# Patient Record
Sex: Female | Born: 1940 | Race: White | Hispanic: No | Marital: Single | Smoking: Never smoker
Health system: Southern US, Community
[De-identification: ages and names within clinical notes are randomized; demographics above are authoritative.]

## PROBLEM LIST (undated history)

## (undated) DIAGNOSIS — F39 Unspecified mood [affective] disorder: Secondary | ICD-10-CM

## (undated) DIAGNOSIS — E7800 Pure hypercholesterolemia, unspecified: Secondary | ICD-10-CM

## (undated) DIAGNOSIS — K635 Polyp of colon: Secondary | ICD-10-CM

## (undated) DIAGNOSIS — G20A1 Parkinson's disease without dyskinesia, without mention of fluctuations: Secondary | ICD-10-CM

## (undated) HISTORY — DX: Polyp of colon: K63.5

## (undated) HISTORY — PX: COLECTOMY: SHX59

---

## 1999-05-06 ENCOUNTER — Other Ambulatory Visit: Admission: RE | Admit: 1999-05-06 | Discharge: 1999-05-06 | Payer: Self-pay | Admitting: *Deleted

## 1999-07-25 ENCOUNTER — Ambulatory Visit (HOSPITAL_COMMUNITY): Admission: RE | Admit: 1999-07-25 | Discharge: 1999-07-25 | Payer: Self-pay | Admitting: Gastroenterology

## 2001-08-05 ENCOUNTER — Encounter: Admission: RE | Admit: 2001-08-05 | Discharge: 2001-08-05 | Payer: Self-pay | Admitting: Otolaryngology

## 2001-08-05 ENCOUNTER — Encounter: Payer: Self-pay | Admitting: Otolaryngology

## 2006-03-02 ENCOUNTER — Encounter: Admission: RE | Admit: 2006-03-02 | Discharge: 2006-03-02 | Payer: Self-pay | Admitting: Family Medicine

## 2006-05-03 ENCOUNTER — Other Ambulatory Visit: Admission: RE | Admit: 2006-05-03 | Discharge: 2006-05-03 | Payer: Self-pay | Admitting: Family Medicine

## 2007-12-19 ENCOUNTER — Encounter: Admission: RE | Admit: 2007-12-19 | Discharge: 2007-12-19 | Payer: Self-pay | Admitting: Family Medicine

## 2014-06-22 ENCOUNTER — Encounter: Payer: Self-pay | Admitting: Internal Medicine

## 2015-08-27 DIAGNOSIS — F310 Bipolar disorder, current episode hypomanic: Secondary | ICD-10-CM | POA: Diagnosis not present

## 2015-09-26 DIAGNOSIS — R7309 Other abnormal glucose: Secondary | ICD-10-CM | POA: Diagnosis not present

## 2015-09-26 DIAGNOSIS — R7303 Prediabetes: Secondary | ICD-10-CM | POA: Diagnosis not present

## 2015-09-26 DIAGNOSIS — Z1211 Encounter for screening for malignant neoplasm of colon: Secondary | ICD-10-CM | POA: Diagnosis not present

## 2015-09-26 DIAGNOSIS — E559 Vitamin D deficiency, unspecified: Secondary | ICD-10-CM | POA: Diagnosis not present

## 2015-09-26 DIAGNOSIS — Z0000 Encounter for general adult medical examination without abnormal findings: Secondary | ICD-10-CM | POA: Diagnosis not present

## 2015-09-26 DIAGNOSIS — Z1389 Encounter for screening for other disorder: Secondary | ICD-10-CM | POA: Diagnosis not present

## 2015-09-26 DIAGNOSIS — F319 Bipolar disorder, unspecified: Secondary | ICD-10-CM | POA: Diagnosis not present

## 2015-09-26 DIAGNOSIS — E7800 Pure hypercholesterolemia, unspecified: Secondary | ICD-10-CM | POA: Diagnosis not present

## 2015-09-26 DIAGNOSIS — E782 Mixed hyperlipidemia: Secondary | ICD-10-CM | POA: Diagnosis not present

## 2015-09-26 DIAGNOSIS — N183 Chronic kidney disease, stage 3 (moderate): Secondary | ICD-10-CM | POA: Diagnosis not present

## 2015-09-30 ENCOUNTER — Other Ambulatory Visit: Payer: Self-pay

## 2015-09-30 DIAGNOSIS — Z1231 Encounter for screening mammogram for malignant neoplasm of breast: Secondary | ICD-10-CM

## 2015-10-14 DIAGNOSIS — M8589 Other specified disorders of bone density and structure, multiple sites: Secondary | ICD-10-CM | POA: Diagnosis not present

## 2015-10-14 DIAGNOSIS — E2839 Other primary ovarian failure: Secondary | ICD-10-CM | POA: Diagnosis not present

## 2015-11-06 ENCOUNTER — Ambulatory Visit
Admission: RE | Admit: 2015-11-06 | Discharge: 2015-11-06 | Disposition: A | Discharge status: Home / Self Care | Payer: Medicare HMO | Source: Ambulatory Visit

## 2015-11-06 DIAGNOSIS — Z1231 Encounter for screening mammogram for malignant neoplasm of breast: Secondary | ICD-10-CM | POA: Diagnosis not present

## 2015-11-12 DIAGNOSIS — Z1211 Encounter for screening for malignant neoplasm of colon: Secondary | ICD-10-CM | POA: Diagnosis not present

## 2015-11-12 DIAGNOSIS — D128 Benign neoplasm of rectum: Secondary | ICD-10-CM | POA: Diagnosis not present

## 2015-11-12 DIAGNOSIS — D126 Benign neoplasm of colon, unspecified: Secondary | ICD-10-CM | POA: Diagnosis not present

## 2015-11-27 ENCOUNTER — Other Ambulatory Visit: Payer: Self-pay | Admitting: Gastroenterology

## 2015-11-27 NOTE — Addendum Note (Signed)
Addended by: Arta Silence on: 11/27/2015 01:44 PM   Modules accepted: Orders

## 2015-11-29 ENCOUNTER — Ambulatory Visit (HOSPITAL_COMMUNITY)
Admission: RE | Admit: 2015-11-29 | Discharge: 2015-11-29 | Disposition: A | Discharge status: Home / Self Care | Payer: Medicare PPO | Source: Ambulatory Visit | Attending: Gastroenterology | Admitting: Gastroenterology

## 2015-11-29 ENCOUNTER — Encounter (HOSPITAL_COMMUNITY)
Admission: RE | Disposition: A | Discharge status: Home / Self Care | Payer: Self-pay | Source: Ambulatory Visit | Attending: Gastroenterology

## 2015-11-29 ENCOUNTER — Encounter (HOSPITAL_COMMUNITY): Payer: Self-pay | Admitting: *Deleted

## 2015-11-29 DIAGNOSIS — D125 Benign neoplasm of sigmoid colon: Secondary | ICD-10-CM | POA: Insufficient documentation

## 2015-11-29 HISTORY — PX: EUS: SHX5427

## 2015-11-29 SURGERY — ULTRASOUND, LOWER GI TRACT, ENDOSCOPIC
Anesthesia: Moderate Sedation

## 2015-11-29 MED ORDER — MIDAZOLAM HCL 10 MG/2ML IJ SOLN
INTRAMUSCULAR | Status: DC | PRN
  Administered 2015-11-29: 2 mg via INTRAVENOUS
  Administered 2015-11-29: 1 mg via INTRAVENOUS

## 2015-11-29 MED ORDER — FENTANYL CITRATE (PF) 100 MCG/2ML IJ SOLN
INTRAMUSCULAR | Status: AC
  Filled 2015-11-29: qty 2

## 2015-11-29 MED ORDER — FENTANYL CITRATE (PF) 100 MCG/2ML IJ SOLN
INTRAMUSCULAR | Status: DC | PRN
  Administered 2015-11-29: 25 ug via INTRAVENOUS

## 2015-11-29 MED ORDER — SODIUM CHLORIDE 0.9 % IV SOLN
INTRAVENOUS | Status: DC
  Administered 2015-11-29: 500 mL via INTRAVENOUS

## 2015-11-29 MED ORDER — MIDAZOLAM HCL 5 MG/ML IJ SOLN
INTRAMUSCULAR | Status: AC
  Filled 2015-11-29: qty 1

## 2015-11-29 NOTE — H&P (Signed)
Patient interval history reviewed.  Patient examined again.  There has been no change from documented H/P dated 10/30/15 (scanned into chart from our office) except as documented above.  Assessment:  1.  Sigmoid lesion, recent biopsies showed tubular adenoma only, region tattooed.  Plan:  1.  Endorectal ultrasound. 2.  Possible repeat mucosal biopsies. 3.  Risks (bleeding, infection, bowel perforation that could require surgery, sedation-related changes in cardiopulmonary systems), benefits (identification and possible treatment of source of symptoms, exclusion of certain causes of symptoms), and alternatives (watchful waiting, radiographic imaging studies, empiric medical treatment) of endorectal ultrasound were explained to patient/family in detail and patient wishes to proceed.

## 2015-11-29 NOTE — Discharge Instructions (Signed)
Flexible Sigmoidoscopy, Care After  Refer to this sheet in the next few weeks. These instructions provide you with information on caring for yourself after your procedure. Your health care provider may also give you more specific instructions. Your treatment has been planned according to current medical practices, but problems sometimes occur. Call your health care provider if you have any problems or questions after your procedure.  WHAT TO EXPECT AFTER THE PROCEDURE  After your procedure, it is typical to have the following:   · Abdominal cramps.  · Bloating.  · A small amount of rectal bleeding if you had a biopsy.  HOME CARE INSTRUCTIONS  · Only take over-the-counter or prescription medicines for pain, fever, or discomfort as directed by your health care provider.  · Resume your normal diet and activities as directed by your health care provider.  SEEK MEDICAL CARE IF:  · You have abdominal pain or cramping that lasts longer than 1 hour after the procedure.  · You continue to have small amounts of rectal bleeding after 24 hours.  · You have nausea or vomiting.  · You feel weak or dizzy.  SEEK IMMEDIATE MEDICAL CARE IF:   · You have a fever.  · You pass large blood clots or see a large amount of blood in the toilet after having a bowel movement. This may also occur 10-14 days after the procedure. It is more likely if you had a biopsy.  · You develop abdominal pain that is not relieved with medicine or your abdominal pain gets worse.  · You have nausea or vomiting for more than 24 hours after the procedure.     This information is not intended to replace advice given to you by your health care provider. Make sure you discuss any questions you have with your health care provider.     Document Released: 11/21/2013 Document Reviewed: 11/21/2013  Elsevier Interactive Patient Education ©2016 Elsevier Inc.

## 2015-11-29 NOTE — Op Note (Signed)
[facility] [place], [id number]   OPERATIVE PROCEDURE REPORT  PATIENT: Alexandria Wallace, Alexandria Wallace  MR#: [id number] BIRTHDATE: [birth year]  GENDER: female ENDOSCOPIST: Arta Silence, MD REFERRED BY:  Teena Irani, M.D. PROCEDURE DATE:  11/29/2015 PROCEDURE:   Endorectal ultrasound; flexible sigmoidoscopy with biopsies ASA CLASS:   Class II INDICATIONS:1.  sigmoid lesion (tubular adenoma on outside biopsies). MEDICATIONS: Fentanyl 25 mcg IV and Versed 3 mg IV  DESCRIPTION OF PROCEDURE:   After the risks benefits and alternatives of the procedure were thoroughly explained, informed consent was obtained.  Throughout the procedure, the patients blood pressure, pulse and oxygen saturations were monitored continuously. Under direct visualization, the radial forward-viewing echoendoscope  and standard endoscope were sequentially introduced through the anus  and advanced to the sigmoid colon .  Water was used as necessary to provide an acoustic interface.  Imaging was obtained at 7.5 and 12Mhz. Upon completion of the imaging, water was removed and the patient was sent to the recovery room in satisfactory condition. Estimated blood loss is zero unless otherwise noted in this procedure report.    FINDINGS:   Normal digital rectal exam; no mass palpated.  In mid-sigmoid colon, tattoo ink seen, which then led to frond-like soft, pliable, lesion.  Lesion was neither fixed nor ulcerated.  On endorectal ultrasound, lesion was 50% circumferential; while there can be challenges obtaining en face non-tangential views in the sigmoid colon, the lesion appeared to be mucosally based.  No neighboring adenopathy noted.  Repeat mucosal biopsies were taken with cold forceps.  STAGING: Lesion not typical of malignancy.  Suspect large adenomatous polyp.  ENDOSCOPIC IMPRESSION: Suspect large tubular adenoma, repeat biopsies obtained, not readily endoscopically  resectable.  RECOMMENDATIONS: 1.  Watch for potential complications of procedure. 2.  Await repeat biopsy results. 3.  Surgical consultation for consideration of sigmoid colectomy.   _______________________________ Lorrin MaisArta Silence, MD 11/29/2015 9:47 AM   CC:

## 2015-12-03 ENCOUNTER — Encounter (HOSPITAL_COMMUNITY): Payer: Self-pay | Admitting: Gastroenterology

## 2015-12-23 ENCOUNTER — Other Ambulatory Visit: Payer: Self-pay | Admitting: Surgery

## 2015-12-23 NOTE — H&P (Signed)
Alexandria Wallace 12/23/2015 3:04 PM Location: [facility] Patient #: [id number] DOB: Oct 26, 1941 Single / Language: Alexandria Wallace / Race: White Female  History of Present Illness Adin Hector MD; 12/23/2015 3:49 PM) Patient words: New-discuss colon.  The patient is a 75 year old female who presents with a colorectal polyp. Note for "Colorectal polyp": Patient sent for surgical consultation by Dr. Teena Irani & Will Outlaw with [facility] gastroenterology. Concern for polyp at rectosigmoid junction.  Pleasant and anxious woman. She comes today by herself. For screening colonoscopy. Last one 2000 which showed diverticulosis according to the patient. Was found to have bulky polyp at rectosigmoid junction. Felt to be in proximal rectum. Follow-up endorectal ultrasound were consistent with midsigmoid colon. Probably at least 4 cm in size, perhaps larger. Involving 50% of circumference. Not felt to be unresectable by endoscopy. Surgical consultation requested.  Patient normally has a bowel movement every day. She works at [employer] doing some aerobics and other light moderate lifting. She had diagnostic laparoscopy 40 years ago. No other surgeries. Can walk an hour without difficulty. She does not smoke.  No personal nor family history of GI/colon cancer (although she wonders if her grandfather had something wrong with his intestine), inflammatory bowel disease, irritable bowel syndrome, allergy such as Celiac Sprue, dietary/dairy problems, colitis, ulcers nor gastritis. No recent sick contacts/gastroenteritis. No travel outside the country. No changes in diet. No dysphagia to solids or liquids. No significant heartburn or reflux. No hematochezia, hematemesis, coffee ground emesis. No evidence of prior gastric/peptic ulceration.   Other Problems Malachi Bonds, CMA; 12/23/2015 3:04 PM) Hypercholesterolemia  Past Surgical History Malachi Bonds, CMA; 12/23/2015 3:04 PM) No pertinent  past surgical history  Diagnostic Studies History Malachi Bonds, CMA; 12/23/2015 3:04 PM) Colonoscopy within last year Mammogram within last year Pap Smear >5 years ago  Medication History Malachi Bonds, CMA; 12/23/2015 3:08 PM) Zetia (10MG  Tablet, Oral) Active. OLANZapine (15MG  Tablet, Oral) Active. FLUoxetine HCl (20MG  Capsule, Oral) Active. Aspirin (81MG  Tablet, Oral) Active. Vitamin D (2000UNIT Capsule, Oral) Active. FLUoxetine HCl (PMDD) (20MG  Capsule, Oral) Active. Fish Oil (1000MG  Capsule DR, Oral) Active. Tumersaid (Oral) Active. Cyanocobalamin (1000MCG Tablet, Oral) Active. Multivitamin Adult (Oral) Active. Medications Reconciled  Social History Malachi Bonds, CMA; 12/23/2015 3:04 PM) Alcohol use Occasional alcohol use. Caffeine use Coffee, Tea. No drug use Tobacco use Never smoker.  Family History Malachi Bonds, [place]; 12/23/2015 3:04 PM) Anesthetic complications Mother. Cancer Father. Kidney Disease Sister.  Pregnancy / Birth History Malachi Bonds, CMA; 12/23/2015 3:04 PM) Age at menarche 49 years. Age of menopause 53-55 Contraceptive History Oral contraceptives. Gravida 2 Irregular periods Maternal age 58-35 Para 2     Review of Systems Malachi Bonds CMA; 12/23/2015 3:04 PM) General Not Present- Appetite Loss, Chills, Fatigue, Fever, Night Sweats, Weight Gain and Weight Loss. Skin Not Present- Change in Wart/Mole, Dryness, Hives, Jaundice, New Lesions, Non-Healing Wounds, Rash and Ulcer. HEENT Present- Ringing in the Ears. Not Present- Earache, Hearing Loss, Hoarseness, Nose Bleed, Oral Ulcers, Seasonal Allergies, Sinus Pain, Sore Throat, Visual Disturbances, Wears glasses/contact lenses and Yellow Eyes. Respiratory Not Present- Bloody sputum, Chronic Cough, Difficulty Breathing, Snoring and Wheezing. Breast Not Present- Breast Mass, Breast Pain, Nipple Discharge and Skin Changes. Cardiovascular Not Present- Chest Pain,  Difficulty Breathing Lying Down, Leg Cramps, Palpitations, Rapid Heart Rate, Shortness of Breath and Swelling of Extremities. Gastrointestinal Not Present- Abdominal Pain, Bloating, Bloody Stool, Change in Bowel Habits, Chronic diarrhea, Constipation, Difficulty Swallowing, Excessive gas, Gets full quickly at meals, Hemorrhoids, Indigestion, Nausea,  Rectal Pain and Vomiting. Female Genitourinary Not Present- Frequency, Nocturia, Painful Urination, Pelvic Pain and Urgency. Musculoskeletal Not Present- Back Pain, Joint Pain, Joint Stiffness, Muscle Pain, Muscle Weakness and Swelling of Extremities. Neurological Not Present- Decreased Memory, Fainting, Headaches, Numbness, Seizures, Tingling, Tremor, Trouble walking and Weakness. Psychiatric Present- Depression. Not Present- Anxiety, Bipolar, Change in Sleep Pattern, Fearful and Frequent crying. Endocrine Not Present- Cold Intolerance, Excessive Hunger, Hair Changes, Heat Intolerance, Hot flashes and New Diabetes. Hematology Not Present- Easy Bruising, Excessive bleeding, Gland problems, HIV and Persistent Infections.  Vitals (Chemira Jones CMA; 12/23/2015 3:05 PM) 12/23/2015 3:05 PM Weight: 155.8 lb Height: 64in Body Surface Area: 1.76 m Body Mass Index: 26.74 kg/m  Temp.: 98.59F(Oral)  BP: 104/72 (Sitting, Left Arm, Standard)      Physical Exam Adin Hector MD; 12/23/2015 3:40 PM)  General Mental Status-Alert. General Appearance-Not in acute distress, Not Sickly. Orientation-Oriented X3. Hydration-Well hydrated. Voice-Normal.  Integumentary Global Assessment Upon inspection and palpation of skin surfaces of the - Axillae: non-tender, no inflammation or ulceration, no drainage. and Distribution of scalp and body hair is normal. General Characteristics Temperature - normal warmth is noted.  Head and Neck Head-normocephalic, atraumatic with no lesions or palpable masses. Face Global Assessment - atraumatic,  no absence of expression. Neck Global Assessment - no abnormal movements, no bruit auscultated on the right, no bruit auscultated on the left, no decreased range of motion, non-tender. Trachea-midline. Thyroid Gland Characteristics - non-tender.  Eye Eyeball - Left-Extraocular movements intact, No Nystagmus. Eyeball - Right-Extraocular movements intact, No Nystagmus. Cornea - Left-No Hazy. Cornea - Right-No Hazy. Sclera/Conjunctiva - Left-No scleral icterus, No Discharge. Sclera/Conjunctiva - Right-No scleral icterus, No Discharge. Pupil - Left-Direct reaction to light normal. Pupil - Right-Direct reaction to light normal.  ENMT Ears Pinna - Left - no drainage observed, no generalized tenderness observed. Right - no drainage observed, no generalized tenderness observed. Nose and Sinuses External Inspection of the Nose - no destructive lesion observed. Inspection of the nares - Left - quiet respiration. Right - quiet respiration. Mouth and Throat Lips - Upper Lip - no fissures observed, no pallor noted. Lower Lip - no fissures observed, no pallor noted. Nasopharynx - no discharge present. Oral Cavity/Oropharynx - Tongue - no dryness observed. Oral Mucosa - no cyanosis observed. Hypopharynx - no evidence of airway distress observed.  Chest and Lung Exam Inspection Movements - Normal and Symmetrical. Accessory muscles - No use of accessory muscles in breathing. Palpation Palpation of the chest reveals - Non-tender. Auscultation Breath sounds - Normal and Clear.  Cardiovascular Auscultation Rhythm - Regular. Murmurs & Other Heart Sounds - Auscultation of the heart reveals - No Murmurs and No Systolic Clicks.  Abdomen Inspection Inspection of the abdomen reveals - No Visible peristalsis and No Abnormal pulsations. Umbilicus - No Bleeding, No Urine drainage. Palpation/Percussion Palpation and Percussion of the abdomen reveal - Soft, Non Tender, No Rebound  tenderness, No Rigidity (guarding) and No Cutaneous hyperesthesia. Note: Soft. No diastases. No umbilical hernia. No discomfort.  Female Genitourinary Sexual Maturity Tanner 5 - Adult hair pattern. Note: No vaginal bleeding nor discharge  Rectal Note: Mildly redundant anoderm but no external tags/hemorrhoids. Normal sphincter tone. Soft stool in rectal vault. No mass to 10 cm. No definite fullness or falling down. No stricture. No fissure. No fistula.  Peripheral Vascular Upper Extremity Inspection - Left - No Cyanotic nailbeds, Not Ischemic. Right - No Cyanotic nailbeds, Not Ischemic.  Neurologic Neurologic evaluation reveals -normal attention span and ability to  concentrate, able to name objects and repeat phrases. Appropriate fund of knowledge , normal sensation and normal coordination. Mental Status Affect - not angry, not paranoid. Cranial Nerves-Normal Bilaterally. Gait-Normal.  Neuropsychiatric Mental status exam performed with findings of-able to articulate well with normal speech/language, rate, volume and coherence, thought content normal with ability to perform basic computations and apply abstract reasoning and no evidence of hallucinations, delusions, obsessions or homicidal/suicidal ideation. Note: Mildly anxious but consolable  Musculoskeletal Global Assessment Spine, Ribs and Pelvis - no instability, subluxation or laxity. Right Upper Extremity - no instability, subluxation or laxity.  Lymphatic Head & Neck  General Head & Neck Lymphatics: Bilateral - Description - No Localized lymphadenopathy. Axillary  General Axillary Region: Bilateral - Description - No Localized lymphadenopathy. Femoral & Inguinal  Generalized Femoral & Inguinal Lymphatics: Left - Description - No Localized lymphadenopathy. Right - Description - No Localized lymphadenopathy.    Assessment & Plan Adin Hector MD; 12/23/2015 3:43 PM)  ADENOMATOUS RECTAL POLYP  (D12.8) Impression: Large bulky adenomatous polyp at mid sigmoid to rectosigmoid colon. At least 4 cm. Probably 50% in circumference.  I think this requires removal. I think it is too proximal to try and do a TEM partial proctectomy. Therefore I recommended segmental (sigmoid) colonic resection. She is an appropriate robotic candidate. I discussed with her at length. Questions answered. She wishes to proceed as soon as possible.  ENCOUNTER FOR PREOPERATIVE EXAMINATION FOR GENERAL SURGICAL PROCEDURE (Z01.818)  Current Plans You are being scheduled for surgery - Our schedulers will call you.  You should hear from our office's scheduling department within 5 working days about the location, date, and time of surgery. We try to make accommodations for patient's preferences in scheduling surgery, but sometimes the OR schedule or the surgeon's schedule prevents [place] from making those accommodations.  If you have not heard from our office [phone]) in 5 working days, call the office and ask for your surgeon's nurse.  If you have other questions about your diagnosis, plan, or surgery, call the office and ask for your surgeon's nurse.  Written instructions provided Pt Education - [facility] Colon Bowel Prep 2015 Miralax/Antibiotics Started Neomycin Sulfate 500MG , 2 (two) Tablet SEE NOTE, #6, 12/23/2015, No Refill. Local Order: TAKE TWO TABLETS AT 2 PM, 3 PM, AND 10 PM THE DAY PRIOR TO SURGERY Started Flagyl 500MG , 2 (two) Tablet SEE NOTE, #6, 12/23/2015, No Refill. Local Order: Take at 2pm, 3pm, and 10pm the day prior to your colon operation Pt Education - [facility] Colectomy post-op instructions: discussed with patient and provided information. Pt Education - Pamphlet Given - Laparoscopic Colorectal Surgery: discussed with patient and provided information. Pt Education - [facility] Good Bowel Health (Citlalic Norlander) Pt Education - [facility] Pain Control (Tangala Wiegert)  Adin Hector, M.D., F.A.C.S. Gastrointestinal and Minimally  Invasive Surgery [facility] [address], [place], [place] [address] [phone] Main / Paging

## 2016-01-31 ENCOUNTER — Encounter (HOSPITAL_COMMUNITY): Payer: Self-pay | Admitting: Emergency Medicine

## 2016-01-31 ENCOUNTER — Emergency Department (INDEPENDENT_AMBULATORY_CARE_PROVIDER_SITE_OTHER): Payer: Medicare Other

## 2016-01-31 ENCOUNTER — Emergency Department (HOSPITAL_COMMUNITY)
Admission: EM | Admit: 2016-01-31 | Discharge: 2016-01-31 | Disposition: A | Discharge status: Home / Self Care | Payer: Medicare Other | Source: Home / Self Care | Attending: Family Medicine | Admitting: Family Medicine

## 2016-01-31 DIAGNOSIS — W19XXXA Unspecified fall, initial encounter: Secondary | ICD-10-CM | POA: Diagnosis not present

## 2016-01-31 DIAGNOSIS — S52122A Displaced fracture of head of left radius, initial encounter for closed fracture: Secondary | ICD-10-CM

## 2016-01-31 DIAGNOSIS — M79602 Pain in left arm: Secondary | ICD-10-CM

## 2016-01-31 NOTE — Discharge Instructions (Signed)
It is a pleasure to see you today.   The x-rays show you have a radial head fracture.    I discussed with the orthopedist on-call.  You are being given a sling to wear during the day.  You are asked to follow up with Dr. Berenice Primas as soon as tomorrow in his office, or sometime during the coming 1 week.

## 2016-01-31 NOTE — ED Notes (Signed)
Pt reports she fell and tripped over tree roots x3 weeks ago on a trail... No deformity visible... States pain increases when she moves it and lifts objects... Denies head inj/LOC... A&O x4... No acute distress.

## 2016-01-31 NOTE — ED Provider Notes (Addendum)
CSN: [id number]     Arrival date & time 01/31/16  1426 History   First MD Initiated Contact with Patient 01/31/16 1709     Chief Complaint  Patient presents with  . Arm Pain   (Consider location/radiation/quality/duration/timing/severity/associated sxs/prior Treatment) Patient is a 75 y.o. female presenting with arm pain. The history is provided by the patient. No language interpreter was used.  Arm Pain  Patient complains of pain along left forearm and wrist, following trip and fall over a tree root about 3 weeks ago. Landed on her L side, initially had pain from L shoulder to wrist. The L shoulder and elbow pain resolved, but she developed extensive bruising along the L elbow and forearm which has since subsided.   She is concerned because she has continued to have pain along the long bones of the forearm as well as wrist pain; worse with lifting objects, turning steering wheel of car; supinating/pronating L wrist/arm.  She has alternated heat and cold packs; no immobilization.   History reviewed. No pertinent past medical history. Past Surgical History  Procedure Laterality Date  . Eus N/A 11/29/2015    Procedure: LOWER ENDOSCOPIC ULTRASOUND (EUS);  Surgeon: Arta Silence, MD;  Location: [facility] ENDOSCOPY;  Service: Endoscopy;  Laterality: N/A;   No family history on file. Social History  Substance Use Topics  . Smoking status: Never Smoker   . Smokeless tobacco: None  . Alcohol Use: 1.2 oz/week    2 Glasses of wine per week   OB History    No data available     Review of Systems  Constitutional: Negative for fever, chills and fatigue.  Musculoskeletal: Positive for myalgias. Negative for joint swelling.  Neurological: Negative for dizziness, syncope and light-headedness.  All other systems reviewed and are negative.   Allergies  Review of patient's allergies indicates no known allergies.  Home Medications   Prior to Admission medications   Medication Sig Start Date End  Date Taking? Authorizing Provider  aspirin EC 81 MG tablet Take 81 mg by mouth at bedtime.   Yes Historical Provider, MD  FLUoxetine (PROZAC) 20 MG capsule Take 20 mg by mouth daily.  10/22/15  Yes Historical Provider, MD  OLANZapine (ZYPREXA) 15 MG tablet Take 7.5 mg by mouth at bedtime.  10/10/15  Yes Historical Provider, MD  ZETIA 10 MG tablet Take 10 mg by mouth daily.  10/19/15  Yes Historical Provider, MD  Cholecalciferol (VITAMIN D3) 2000 UNITS TABS Take 2,000 Units by mouth daily.    Historical Provider, MD  Omega-3 Fatty Acids (FISH OIL) 1000 MG CAPS Take 1-4 capsules by mouth 2 (two) times daily. 1 capsule in the morning and 3 at night    Historical Provider, MD  Pyridoxine HCl (B-6 PO) Take 1 tablet by mouth 2 (two) times daily.    Historical Provider, MD  Turmeric 450 MG CAPS Take 1 capsule by mouth daily.    Historical Provider, MD  vitamin B-12 (CYANOCOBALAMIN) 1000 MCG tablet Take 1,000 mcg by mouth daily.    Historical Provider, MD   Meds Ordered and Administered this Visit  Medications - No data to display  BP 111/72 mmHg  Pulse 73  Temp(Src) 98.1 F (36.7 C) (Oral)  Resp 12  SpO2 98% No data found.   Physical Exam  Constitutional: She appears well-developed and well-nourished. No distress.  Musculoskeletal:  Handgrip full and symmetric bilaterally.  Palpable radial pulses bilaterally.  Sensation in distal fingers intact and symmetric bilaterally.   Tenderness to  palpate along the forearm. No ecchymosis.  Full active flexion/extension of L elbow.  Wrist with full active flexion/extension and ulnar/radial deviation. No tenderness along anatomic snuff box in L wrist.   Neurological:  Sensation intact in distal fingers of both hands. Brisk cap refill in fingertips bilaterally (<2 seconds).   Skin: She is not diaphoretic.    ED Course  Procedures (including critical care time)  Labs Review Labs Reviewed - No data to display  Imaging Review No results  found.   Visual Acuity Review  Right Eye Distance:   Left Eye Distance:   Bilateral Distance:    Right Eye Near:   Left Eye Near:    Bilateral Near:         MDM  No diagnosis found. Patient who is s/p fall with continued pain along L forearm.  X-ray evaluation of L elbow, forearm and wrist: Nondisplaced radial head fracture.  Films reviewed by me independently.   Discussed with orthopedist on call, Dr. Berenice Primas of [facility], who recommends sling and follow up with him in his office within the next week.    Dalbert Mayotte, MD    Willeen Niece, MD 01/31/16 [place], MD 01/31/16 [phone]

## 2016-02-19 ENCOUNTER — Encounter (HOSPITAL_COMMUNITY): Admission: RE | Payer: Self-pay | Source: Ambulatory Visit

## 2016-02-19 ENCOUNTER — Inpatient Hospital Stay (HOSPITAL_COMMUNITY): Admission: RE | Admit: 2016-02-19 | Payer: Medicare PPO | Source: Ambulatory Visit | Admitting: Surgery

## 2016-02-19 HISTORY — PX: POLYPECTOMY: SHX149

## 2016-02-19 SURGERY — COLECTOMY, PARTIAL, ROBOT-ASSISTED, LAPAROSCOPIC
Anesthesia: General

## 2016-02-25 ENCOUNTER — Encounter (HOSPITAL_BASED_OUTPATIENT_CLINIC_OR_DEPARTMENT_OTHER): Payer: Self-pay | Admitting: *Deleted

## 2016-02-25 ENCOUNTER — Emergency Department (HOSPITAL_BASED_OUTPATIENT_CLINIC_OR_DEPARTMENT_OTHER)
Admission: EM | Admit: 2016-02-25 | Discharge: 2016-02-25 | Disposition: A | Discharge status: Home / Self Care | Payer: Medicare Other | Attending: Emergency Medicine | Admitting: Emergency Medicine

## 2016-02-25 ENCOUNTER — Emergency Department (HOSPITAL_BASED_OUTPATIENT_CLINIC_OR_DEPARTMENT_OTHER): Payer: Medicare Other

## 2016-02-25 DIAGNOSIS — K1121 Acute sialoadenitis: Secondary | ICD-10-CM | POA: Insufficient documentation

## 2016-02-25 DIAGNOSIS — E7800 Pure hypercholesterolemia, unspecified: Secondary | ICD-10-CM | POA: Insufficient documentation

## 2016-02-25 DIAGNOSIS — R220 Localized swelling, mass and lump, head: Secondary | ICD-10-CM | POA: Diagnosis present

## 2016-02-25 DIAGNOSIS — Z7982 Long term (current) use of aspirin: Secondary | ICD-10-CM | POA: Diagnosis not present

## 2016-02-25 DIAGNOSIS — Z8659 Personal history of other mental and behavioral disorders: Secondary | ICD-10-CM | POA: Diagnosis not present

## 2016-02-25 DIAGNOSIS — Z79899 Other long term (current) drug therapy: Secondary | ICD-10-CM | POA: Insufficient documentation

## 2016-02-25 HISTORY — DX: Pure hypercholesterolemia, unspecified: E78.00

## 2016-02-25 HISTORY — DX: Unspecified mood (affective) disorder: F39

## 2016-02-25 LAB — BASIC METABOLIC PANEL
ANION GAP: 10 (ref 5–15)
BUN: 15 mg/dL (ref 6–20)
CALCIUM: 9.5 mg/dL (ref 8.9–10.3)
CO2: 23 mmol/L (ref 22–32)
CREATININE: 0.88 mg/dL (ref 0.44–1.00)
Chloride: 107 mmol/L (ref 101–111)
GFR calc Af Amer: >60 mL/min (ref >60)
GLUCOSE: 114 mg/dL — AB (ref 65–99)
Potassium: 4 mmol/L (ref 3.5–5.1)
Sodium: 140 mmol/L (ref 135–145)

## 2016-02-25 LAB — CBC WITH DIFFERENTIAL/PLATELET
Basophils Absolute: 0 10*3/uL (ref 0.0–0.1)
Basophils Relative: 0 %
EOS ABS: 0.1 10*3/uL (ref 0.0–0.7)
EOS PCT: 1 %
HCT: 28.5 % — ABNORMAL LOW (ref 36.0–46.0)
Hemoglobin: 9.2 g/dL — ABNORMAL LOW (ref 12.0–15.0)
LYMPHS ABS: 1.7 10*3/uL (ref 0.7–4.0)
LYMPHS PCT: 11 %
MCH: 31.8 pg (ref 26.0–34.0)
MCHC: 32.3 g/dL (ref 30.0–36.0)
MCV: 98.6 fL (ref 78.0–100.0)
MONOS PCT: 8 %
Monocytes Absolute: 1.3 10*3/uL — ABNORMAL HIGH (ref 0.1–1.0)
Neutro Abs: 12.6 10*3/uL — ABNORMAL HIGH (ref 1.7–7.7)
Neutrophils Relative %: 80 %
PLATELETS: 350 10*3/uL (ref 150–400)
RBC: 2.89 MIL/uL — ABNORMAL LOW (ref 3.87–5.11)
RDW: 13.4 % (ref 11.5–15.5)
WBC: 15.7 10*3/uL — ABNORMAL HIGH (ref 4.0–10.5)

## 2016-02-25 MED ORDER — CLINDAMYCIN HCL 150 MG PO CAPS
450.0000 mg | ORAL_CAPSULE | Freq: Once | ORAL | Status: AC
  Administered 2016-02-25: 450 mg via ORAL
  Filled 2016-02-25: qty 3

## 2016-02-25 MED ORDER — CLINDAMYCIN HCL 150 MG PO CAPS: 450.0000 mg | ORAL_CAPSULE | Freq: Four times a day (QID) | ORAL | Status: DC

## 2016-02-25 MED ORDER — IOPAMIDOL (ISOVUE-300) INJECTION 61%
100.0000 mL | Freq: Once | INTRAVENOUS | Status: AC | PRN
  Administered 2016-02-25: 100 mL via INTRAVENOUS

## 2016-02-25 MED ORDER — SODIUM CHLORIDE 0.9 % IV BOLUS (SEPSIS)
1000.0000 mL | Freq: Once | INTRAVENOUS | Status: AC
  Administered 2016-02-25: 1000 mL via INTRAVENOUS

## 2016-02-25 NOTE — ED Notes (Signed)
MD at bedside. 

## 2016-02-25 NOTE — ED Notes (Signed)
Left side of her face has been swelling x 2 days. She was seen at [facility] and told to come here. No difficulty speaking or breathing.

## 2016-02-25 NOTE — ED Notes (Signed)
Pt states she had surgery at [facility]. Sunday, noticed left side facial swelling and pain. Fever last p.m. No cough, problem swallowing, or SHOB. Saw PCP today and was sent here with ?elevated WBC and low CBC.

## 2016-02-25 NOTE — ED Provider Notes (Signed)
CSN: [id number]     Arrival date & time 02/25/16  1401 History   First MD Initiated Contact with Patient 02/25/16 1525     Chief Complaint  Patient presents with  . Facial Swelling     (Consider location/radiation/quality/duration/timing/severity/associated sxs/prior Treatment) HPI  75 year old female presents with left facial swelling. Started 2 days ago. Some pain but ibuprofen has essentially reduce the pain down to a 1. Was seen at her PCP where they drew labs that showed an elevated white blood cell count to 16 and a low hemoglobin at 9.4. Patient just had a partial colectomy last week at [facility]. She does not know what her hemoglobin normally is. Patient states she had a fever last night of 101. She has not noticed any dental pain. There is no pain in her ear, only under her ear.  Past Medical History  Diagnosis Date  . High cholesterol   . Mood disorder [facility])    Past Surgical History  Procedure Laterality Date  . Eus N/A 11/29/2015    Procedure: LOWER ENDOSCOPIC ULTRASOUND (EUS);  Surgeon: Arta Silence, MD;  Location: [facility] ENDOSCOPY;  Service: Endoscopy;  Laterality: N/A;  . Colectomy     No family history on file. Social History  Substance Use Topics  . Smoking status: Never Smoker   . Smokeless tobacco: None  . Alcohol Use: 1.2 oz/week    2 Glasses of wine per week   OB History    No data available     Review of Systems  Constitutional: Positive for fever.  HENT: Positive for facial swelling. Negative for dental problem, sore throat, trouble swallowing and voice change.   Respiratory: Negative for cough and shortness of breath.   Gastrointestinal: Negative for vomiting.  Genitourinary: Negative for dysuria.  All other systems reviewed and are negative.     Allergies  Review of patient's allergies indicates no known allergies.  Home Medications   Prior to Admission medications   Medication Sig Start Date End Date Taking? Authorizing Provider  aspirin EC  81 MG tablet Take 81 mg by mouth at bedtime.    Historical Provider, MD  Cholecalciferol (VITAMIN D3) 2000 UNITS TABS Take 2,000 Units by mouth daily.    Historical Provider, MD  FLUoxetine (PROZAC) 20 MG capsule Take 20 mg by mouth daily.  10/22/15   Historical Provider, MD  OLANZapine (ZYPREXA) 15 MG tablet Take 7.5 mg by mouth at bedtime.  10/10/15   Historical Provider, MD  Omega-3 Fatty Acids (FISH OIL) 1000 MG CAPS Take 1-4 capsules by mouth 2 (two) times daily. 1 capsule in the morning and 3 at night    Historical Provider, MD  Pyridoxine HCl (B-6 PO) Take 1 tablet by mouth 2 (two) times daily.    Historical Provider, MD  Turmeric 450 MG CAPS Take 1 capsule by mouth daily.    Historical Provider, MD  vitamin B-12 (CYANOCOBALAMIN) 1000 MCG tablet Take 1,000 mcg by mouth daily.    Historical Provider, MD  ZETIA 10 MG tablet Take 10 mg by mouth daily.  10/19/15   Historical Provider, MD   BP 104/88 mmHg  Pulse 104  Temp(Src) 99.5 F (37.5 C) (Oral)  Resp 18  Ht 5\' 4"  (1.626 m)  Wt 150 lb (68.04 kg)  BMI 25.73 kg/m2  SpO2 99% Physical Exam  Constitutional: She is oriented to person, place, and time. She appears well-developed and well-nourished.  HENT:  Head: Normocephalic and atraumatic.    Right Ear: Tympanic membrane and  external ear normal.  Left Ear: Tympanic membrane and external ear normal.  Nose: Nose normal.  Mouth/Throat: Oropharynx is clear and moist. No oropharyngeal exudate.  Eyes: Right eye exhibits no discharge. Left eye exhibits no discharge.  Neck: Normal range of motion.  No meningismus  Cardiovascular: Normal rate, regular rhythm and normal heart sounds.   Pulmonary/Chest: Effort normal and breath sounds normal.  Abdominal: Soft. There is no tenderness.  Neurological: She is alert and oriented to person, place, and time.  Skin: Skin is warm and dry.  Nursing note and vitals reviewed.   ED Course  Procedures (including critical care time) Labs  Review Labs Reviewed  BASIC METABOLIC PANEL - Abnormal; Notable for the following:    Glucose, Bld 114 (*)    All other components within normal limits  CBC WITH DIFFERENTIAL/PLATELET - Abnormal; Notable for the following:    WBC 15.7 (*)    RBC 2.89 (*)    Hemoglobin 9.2 (*)    HCT 28.5 (*)    Neutro Abs 12.6 (*)    Monocytes Absolute 1.3 (*)    All other components within normal limits    Imaging Review Ct Soft Tissue Neck W Contrast  02/25/2016  CLINICAL DATA:  Swelling and left-sided neck pain for for the past 2 days. Colonic surgery 1 week ago. EXAM: CT NECK WITH CONTRAST TECHNIQUE: Multidetector CT imaging of the neck was performed using the standard protocol following the bolus administration of intravenous contrast. CONTRAST:  125mL ISOVUE-300 IOPAMIDOL (ISOVUE-300) INJECTION 61% COMPARISON:  None. FINDINGS: Pharynx and larynx: Negative. Salivary glands: Parotid glands are partially obscured by extensive dental amalgam. There appears to be enlargement and edema of the LEFT parotid gland. Stranding is seen of the fat extending caudally towards the LEFT submandibular gland, and along the anterior margin of LEFT sternocleidomastoid. The LEFT submandibular gland is not clearly enlarged. No submandibular or parotid calculi are seen. Thyroid: Estimated 9 x 14 mm partially calcified RIGHT thyroid nodule. This does not overall enlarged gland. Lymph nodes: No pathologic adenopathy. LEFT level IIA node up to 6 mm short axis adjacent to the area of inflammation. Vascular: Mild atheromatous change. Limited intracranial: Negative. Visualized orbits: Negative. Mastoids and visualized paranasal sinuses: Negative. Skeleton: No osseous findings. Upper chest: No masses or pneumothorax. IMPRESSION: Suspected unilateral LEFT parotitis. This may be related to the recent surgical procedure. Other infectious or inflammatory conditions are possible. Mild stranding around the LEFT parotid, without visible calculi.  Electronically Signed   By: Staci Righter M.D.   On: 02/25/2016 17:21   I have personally reviewed and evaluated these images and lab results as part of my medical decision-making.   EKG Interpretation None      MDM   Final diagnoses:  Acute parotitis    Patient is well-appearing here. Mildly tachycardic on original presentation but now heart rate is in the 70s. She is not in acute distress. No airway signs/symptoms. CT scan does not show abscess or cellulitis but does show left parotiditis. Given fevers at home, she will be treated with oral antibiotics. Her hemoglobin is low but this seems to be stable compared to lab work 5 days ago at [facility] after her surgery. She does have an elevated white blood cell count but appears quite well and it do not think she needs admission or IV antibiotics. Discussed return cautions and need for close follow-up.    Sherwood Gambler, MD 02/25/16 [phone]

## 2016-10-02 ENCOUNTER — Telehealth: Payer: Self-pay | Admitting: Internal Medicine

## 2016-10-02 NOTE — Telephone Encounter (Signed)
Received records from [place] for appointment on 11/11/16 with Dr Debara Pickett.  Records given to [facility] (medical records) for Dr [facility] schedule on 11/11/16. lp

## 2016-11-11 ENCOUNTER — Ambulatory Visit (INDEPENDENT_AMBULATORY_CARE_PROVIDER_SITE_OTHER): Payer: Medicare Other | Admitting: Internal Medicine

## 2016-11-11 ENCOUNTER — Encounter: Payer: Self-pay | Admitting: Internal Medicine

## 2016-11-11 VITALS — BP 108/72 | HR 84 | Ht 64.0 in | Wt 154.0 lb

## 2016-11-11 DIAGNOSIS — R011 Cardiac murmur, unspecified: Secondary | ICD-10-CM

## 2016-11-11 DIAGNOSIS — E782 Mixed hyperlipidemia: Secondary | ICD-10-CM | POA: Insufficient documentation

## 2016-11-11 NOTE — Progress Notes (Signed)
OFFICE NOTE  Chief Complaint:  Murmur  Primary Care Physician: Gerrit Heck, MD  HPI:  Alexandria Wallace is a 75 y.o. female who I previously saw in 2012 for evaluation of heart murmur. She underwent an echocardiogram at that time which showed an EF of greater than [id number], mild diastolic dysfunction, mild to moderate TR with an RVSP of 40 mmHg, and mild aortic sclerosis without stenosis. She also underwent a stress test which showed no reversible ischemia. She denies any chest pain or worsening shortness of breath. Recently she saw her primary care provider who noted a heart murmur which was not previously appreciated. She was referred for reevaluation. Alexandria Wallace reports doing well. She is quite active physically. Although she takes medications for mood disorder and has some mildly elevated cholesterol, she has no other cardiac issues that were aware of. Cholesterol is treated by her primary care provider and she is on zetia. Her mother died at age 67 of an MI but there is no other coronary disease in the family.  PMHx:  Past Medical History:  Diagnosis Date  . High cholesterol   . Mood disorder [facility])     Past Surgical History:  Procedure Laterality Date  . COLECTOMY    . EUS N/A 11/29/2015   Procedure: LOWER ENDOSCOPIC ULTRASOUND (EUS);  Surgeon: Arta Silence, MD;  Location: [facility] ENDOSCOPY;  Service: Endoscopy;  Laterality: N/A;    FAMHx:  Family History  Problem Relation Age of Onset  . Heart attack Mother   . Cancer Father     SOCHx:   reports that she has never smoked. She has never used smokeless tobacco. She reports that she drinks about 1.2 oz of alcohol per week . She reports that she does not use drugs.  ALLERGIES:  No Known Allergies  ROS: Pertinent items noted in HPI and remainder of comprehensive ROS otherwise negative.  HOME MEDS: Current Outpatient Prescriptions on File Prior to Visit  Medication Sig Dispense Refill  . aspirin EC 81 MG  tablet Take 81 mg by mouth at bedtime.    . Cholecalciferol (VITAMIN D3) 2000 UNITS TABS Take 2,000 Units by mouth daily.    Marland Kitchen FLUoxetine (PROZAC) 20 MG capsule Take 20 mg by mouth daily.     Marland Kitchen OLANZapine (ZYPREXA) 15 MG tablet Take 7.5 mg by mouth at bedtime.     . Omega-3 Fatty Acids (FISH OIL) 1000 MG CAPS Take 1-4 capsules by mouth 2 (two) times daily. 1 capsule in the morning and 3 at night    . Pyridoxine HCl (B-6 PO) Take 1 tablet by mouth 2 (two) times daily.    . Turmeric 450 MG CAPS Take 1 capsule by mouth daily.    . vitamin B-12 (CYANOCOBALAMIN) 1000 MCG tablet Take 1,000 mcg by mouth daily.    Marland Kitchen ZETIA 10 MG tablet Take 10 mg by mouth daily.      No current facility-administered medications on file prior to visit.     LABS/IMAGING: No results found for this or any previous visit (from the past 48 hour(s)). No results found.  WEIGHTS: Wt Readings from Last 3 Encounters:  11/11/16 154 lb (69.9 kg)  02/25/16 150 lb (68 kg)  11/29/15 157 lb (71.2 kg)    VITALS: BP 108/72   Pulse 84   Ht 5\' 4"  (1.626 m)   Wt 154 lb (69.9 kg)   BMI 26.43 kg/m   EXAM: General appearance: alert and no distress Neck: no carotid bruit  and no JVD Lungs: clear to auscultation bilaterally Heart: regular rate and rhythm, S1, S2 normal and systolic murmur: early systolic 3/6, crescendo at 2nd right intercostal space Abdomen: soft, non-tender; bowel sounds normal; no masses,  no organomegaly Extremities: extremities normal, atraumatic, no cyanosis or edema Pulses: 2+ and symmetric Skin: Skin color, texture, turgor normal. No rashes or lesions Neurologic: Grossly normal Psych: Pleasant  EKG: Normal sinus rhythm at 84  ASSESSMENT: 1. Murmur, with history of tricuspid regurgitation and mild aortic sclerosis 2. Dyslipidemia  PLAN: 1.   [name] has a somewhat louder murmur and has known tricuspid regurgitation and mild aortic sclerosis by echo in 2012. Based on exam changes, would  recommend a repeat echo to assess for stability of the valves. She denies shortness of breath with exertion however does not do significant activity. I'll follow-up with her regarding the echo results, otherwise I can see her annually.  Thanks for referring her back for evaluation.  Pixie Casino, MD, [facility] Attending Cardiologist [place] [facility] 11/11/2016, 10:31 AM

## 2016-11-11 NOTE — Patient Instructions (Signed)
Schedule at [facility]  [address] at  [place] has requested that you have an echocardiogram. Echocardiography is a painless test that uses sound waves to create images of your heart. It provides your doctor with information about the size and shape of your heart and how well your heart's chambers and valves are working. This procedure takes approximately one hour. There are no restrictions for this procedure.    No other changes with medications    Your physician wants you to follow-up in 12 months with Dr Debara Pickett. You will receive a reminder letter in the mail two months in advance. If you don't receive a letter, please call our office to schedule the follow-up appointment.  If you need a refill on your cardiac medications before your next appointment, please call your pharmacy.

## 2016-12-07 ENCOUNTER — Ambulatory Visit (HOSPITAL_COMMUNITY): Payer: Medicare Other | Attending: Cardiology

## 2016-12-07 ENCOUNTER — Other Ambulatory Visit: Payer: Self-pay

## 2016-12-07 DIAGNOSIS — R011 Cardiac murmur, unspecified: Secondary | ICD-10-CM | POA: Diagnosis not present

## 2016-12-07 DIAGNOSIS — I358 Other nonrheumatic aortic valve disorders: Secondary | ICD-10-CM | POA: Insufficient documentation

## 2018-01-27 ENCOUNTER — Encounter (HOSPITAL_COMMUNITY)
Admission: RE | Disposition: A | Discharge status: Home / Self Care | Payer: Self-pay | Source: Ambulatory Visit | Attending: Gastroenterology

## 2018-01-27 ENCOUNTER — Other Ambulatory Visit: Payer: Self-pay | Admitting: Gastroenterology

## 2018-01-27 ENCOUNTER — Other Ambulatory Visit: Payer: Self-pay

## 2018-01-27 ENCOUNTER — Encounter (HOSPITAL_COMMUNITY): Payer: Self-pay | Admitting: *Deleted

## 2018-01-27 ENCOUNTER — Ambulatory Visit (HOSPITAL_COMMUNITY)
Admission: RE | Admit: 2018-01-27 | Discharge: 2018-01-27 | Disposition: A | Discharge status: Home / Self Care | Payer: Medicare Other | Source: Ambulatory Visit | Attending: Gastroenterology | Admitting: Gastroenterology

## 2018-01-27 DIAGNOSIS — K921 Melena: Secondary | ICD-10-CM | POA: Diagnosis not present

## 2018-01-27 DIAGNOSIS — Z9049 Acquired absence of other specified parts of digestive tract: Secondary | ICD-10-CM | POA: Insufficient documentation

## 2018-01-27 DIAGNOSIS — Z7982 Long term (current) use of aspirin: Secondary | ICD-10-CM | POA: Diagnosis not present

## 2018-01-27 DIAGNOSIS — F39 Unspecified mood [affective] disorder: Secondary | ICD-10-CM | POA: Diagnosis not present

## 2018-01-27 DIAGNOSIS — K9189 Other postprocedural complications and disorders of digestive system: Secondary | ICD-10-CM | POA: Diagnosis not present

## 2018-01-27 DIAGNOSIS — R7303 Prediabetes: Secondary | ICD-10-CM | POA: Diagnosis not present

## 2018-01-27 DIAGNOSIS — E7800 Pure hypercholesterolemia, unspecified: Secondary | ICD-10-CM | POA: Diagnosis not present

## 2018-01-27 DIAGNOSIS — Z86010 Personal history of colonic polyps: Secondary | ICD-10-CM | POA: Diagnosis not present

## 2018-01-27 DIAGNOSIS — K625 Hemorrhage of anus and rectum: Secondary | ICD-10-CM | POA: Diagnosis present

## 2018-01-27 DIAGNOSIS — Y92234 Operating room of hospital as the place of occurrence of the external cause: Secondary | ICD-10-CM | POA: Insufficient documentation

## 2018-01-27 DIAGNOSIS — N182 Chronic kidney disease, stage 2 (mild): Secondary | ICD-10-CM | POA: Diagnosis not present

## 2018-01-27 DIAGNOSIS — Z79899 Other long term (current) drug therapy: Secondary | ICD-10-CM | POA: Diagnosis not present

## 2018-01-27 DIAGNOSIS — Z980 Intestinal bypass and anastomosis status: Secondary | ICD-10-CM | POA: Diagnosis not present

## 2018-01-27 DIAGNOSIS — Y832 Surgical operation with anastomosis, bypass or graft as the cause of abnormal reaction of the patient, or of later complication, without mention of misadventure at the time of the procedure: Secondary | ICD-10-CM | POA: Insufficient documentation

## 2018-01-27 DIAGNOSIS — E559 Vitamin D deficiency, unspecified: Secondary | ICD-10-CM | POA: Diagnosis not present

## 2018-01-27 HISTORY — PX: FLEXIBLE SIGMOIDOSCOPY: SHX5431

## 2018-01-27 SURGERY — SIGMOIDOSCOPY, FLEXIBLE
Anesthesia: Moderate Sedation

## 2018-01-27 MED ORDER — FENTANYL CITRATE (PF) 100 MCG/2ML IJ SOLN
INTRAMUSCULAR | Status: AC
  Filled 2018-01-27: qty 2

## 2018-01-27 MED ORDER — MIDAZOLAM HCL 5 MG/ML IJ SOLN
INTRAMUSCULAR | Status: AC
  Filled 2018-01-27: qty 2

## 2018-01-27 NOTE — H&P (Signed)
Alexandria Wallace is a 77 y.o. female has presented with rectal bleeding The various methods of treatment have been discussed with the patient and family. After consideration of risks, benefits and other options for treatment, the patient has consented to  Procedure(s): Flexible Sigmoidoscopy with APC  as an intervention .  The patient's history has been reviewed, patient examined, no change in status, stable for surgery.  I have reviewed the patient's chart and labs.  Questions were answered to the patient's satisfaction.   Patient underwent flexible sigmoidoscopy today at the endoscopy center which showed active bleeding from hypervascular area at the anastomotic site in the rectum. Different management options discussed with the patient in the postoperative area.  also discussed with Dr. Oletta Lamas as well as Dr. Watt Climes. We have decided to flexible sigmoidoscopy for APC for rectal bleeding today.  Risks (bleeding, infection, post-APC ulcer formation,  bowel perforation that could require surgery, sedation-related changes in cardiopulmonary systems), benefits (identification and possible treatment of source of symptoms, exclusion of certain causes of symptoms), and alternatives (watchful waiting, radiographic imaging studies, empiric medical treatment)  were explained to patient in detail and patient wishes to proceed.  Otis Brace MD, [place] 01/27/2018, 3:31 PM  Contact #  [phone]

## 2018-01-27 NOTE — Discharge Instructions (Signed)

## 2018-01-27 NOTE — Op Note (Signed)
[facility] Patient Name: Alexandria Wallace Procedure Date : 01/27/2018 MRN: [id number] Attending MD: Otis Brace , MD Date of Birth: 09/01/1941 CSN: [id number] Age: 77 Admit Type: Outpatient Procedure:                Flexible Sigmoidoscopy Indications:              Hematochezia Providers:                Otis Brace, MD, Kingsley Plan, RN, Cletis Athens, Technician Referring MD:              Medicines:                None Complications:            No immediate complications. Estimated Blood Loss:     Estimated blood loss was minimal. Procedure:                Pre-Anesthesia Assessment:                           - Prior to the procedure, a History and Physical                            was performed, and patient medications and                            allergies were reviewed. The patient's tolerance of                            previous anesthesia was also reviewed. The risks                            and benefits of the procedure and the sedation                            options and risks were discussed with the patient.                            All questions were answered, and informed consent                            was obtained. Prior Anticoagulants: The patient has                            taken no previous anticoagulant or antiplatelet                            agents. ASA Grade Assessment: II - A patient with                            mild systemic disease. After reviewing the risks                            and benefits, the  patient was deemed in                            satisfactory condition to undergo the procedure.                           After obtaining informed consent, the scope was                            passed under direct vision. The [id number] ([id number])                            scope was introduced through the anus and advanced                            to the the rectum. The flexible  sigmoidoscopy was                            accomplished without difficulty. The patient                            tolerated the procedure well. The quality of the                            bowel preparation was good. Findings:      The perianal and digital rectal examinations were normal.      There was evidence of a prior end-to-side colo-rectal anastomosis in the       rectum. This was patent and was characterized by erosion, erythema,       friable mucosa and a hemorrhagic appearance. Fulguration to stop the       bleeding by argon plasma at 0.5 liters/minute and 15 watts was       successful. Estimated blood loss was minimal. Impression:               - Patent end-to-side colo-rectal anastomosis,                            characterized by erosion, erythema, friable mucosa                            and a hemorrhagic appearance. Treated with argon                            plasma coagulation (APC).                           - No specimens collected. Recommendation:           - Patient has a contact number available for                            emergencies. The signs and symptoms of potential                            delayed complications were discussed with the  patient. Return to normal activities tomorrow.                            Written discharge instructions were provided to the                            patient.                           - Resume previous diet.                           - Continue present medications.                           - Repeat flexible sigmoidoscopy PRN for retreatment. Procedure Code(s):        --- Professional ---                           [phone], 54, Sigmoidoscopy, flexible; with control of                            bleeding, any method Diagnosis Code(s):        --- Professional ---                           Z98.0, Intestinal bypass and anastomosis status                           K92.1, Melena (includes  Hematochezia) CPT copyright 2016 [employer]. All rights reserved. The codes documented in this report are preliminary and upon coder review may  be revised to meet current compliance requirements. Otis Brace, MD Otis Brace, MD 01/27/2018 4:26:57 PM Number of Addenda: 0

## 2018-08-22 ENCOUNTER — Other Ambulatory Visit: Payer: Self-pay | Admitting: Family Medicine

## 2018-08-22 DIAGNOSIS — Z1231 Encounter for screening mammogram for malignant neoplasm of breast: Secondary | ICD-10-CM

## 2018-08-31 ENCOUNTER — Other Ambulatory Visit: Payer: Self-pay | Admitting: Family Medicine

## 2018-08-31 DIAGNOSIS — N644 Mastodynia: Secondary | ICD-10-CM

## 2018-09-01 ENCOUNTER — Encounter: Payer: Self-pay | Admitting: Internal Medicine

## 2018-09-01 ENCOUNTER — Ambulatory Visit: Payer: Medicare Other | Admitting: Internal Medicine

## 2018-09-01 VITALS — BP 96/62 | HR 63 | Ht 64.0 in | Wt 158.4 lb

## 2018-09-01 DIAGNOSIS — R5383 Other fatigue: Secondary | ICD-10-CM | POA: Diagnosis not present

## 2018-09-01 DIAGNOSIS — Z01812 Encounter for preprocedural laboratory examination: Secondary | ICD-10-CM | POA: Diagnosis not present

## 2018-09-01 DIAGNOSIS — E7849 Other hyperlipidemia: Secondary | ICD-10-CM | POA: Diagnosis not present

## 2018-09-01 DIAGNOSIS — R0609 Other forms of dyspnea: Secondary | ICD-10-CM | POA: Diagnosis not present

## 2018-09-01 MED ORDER — ROSUVASTATIN CALCIUM 40 MG PO TABS: 40.0000 mg | ORAL_TABLET | Freq: Every day | ORAL | 3 refills | Status: DC

## 2018-09-01 MED ORDER — METOPROLOL TARTRATE 50 MG PO TABS: ORAL_TABLET | ORAL | 0 refills | Status: DC

## 2018-09-01 NOTE — Progress Notes (Signed)
OFFICE NOTE  Chief Complaint:  Fatigue, hypotension, dyspnea on exertion  Primary Care Physician: Cari Caraway, MD  HPI:  Alexandria Wallace is a 77 y.o. female who I previously saw in 2012 for evaluation of heart murmur. She underwent an echocardiogram at that time which showed an EF of greater than 01%, mild diastolic dysfunction, mild to moderate TR with an RVSP of 40 mmHg, and mild aortic sclerosis without stenosis. She also underwent a stress test which showed no reversible ischemia. She denies any chest pain or worsening shortness of breath. Recently she saw her primary care provider who noted a heart murmur which was not previously appreciated. She was referred for reevaluation. Alexandria Wallace reports doing well. She is quite active physically. Although she takes medications for mood disorder and has some mildly elevated cholesterol, she has no other cardiac issues that were aware of. Cholesterol is treated by her primary care provider and she is on zetia. Her mother died at age 78 of an MI but there is no other coronary disease in the family.  09/01/2018  Alexandria Wallace returns today for follow-up.  I have only seen her twice in the last 7 years.  Initially in 2012 for evaluation of heart murmur.  Echo showed some TR and mild aortic sclerosis.  I last saw her in 2017 and a repeat echo in 2018 showed mild aortic sclerosis with normal systolic function and no significant valvular abnormalities.  She mentioned that she had a history of elevated cholesterol in the past but was hesitant to take statins.  She denied any history of heart disease in her family and noted that her mother died at age 20 and did have an MRI.  Her father actually died in his 51s from cancer and is unclear whether he had any coronary disease.  I did receive lab work from her PCP at [facility], Dr. Addison Lank that indicated a severe dyslipidemia.  In fact in June 2019 her total cholesterol was 321, triglycerides 144, HDL 56 and  LDL-C of 236.  Non-HDL cholesterol 265.  This is in the setting of ezetimibe.  When I press her more on this she says that actually she was given the diagnosis of a genetic dyslipidemia when she spent some time in [place].  Her daughter apparently is an Systems developer and also practices herbal medications and has convinced her that statin therapy is potentially harmful.  Alexandria Wallace was on Crestor for short period of time and apparently tolerated it with an appropriate decrease in her cholesterol.  These findings are highly suggestive of FH and certainly raise my concern that her symptoms may be due to significant coronary artery disease.  She had last had stress testing back in 2012 which was negative for ischemia.  Currently she gets short of breath when doing moderate to significant exertion, such as walking upstairs or doing water aerobics.  She also notes an associated drop in blood pressure with these episodes.  She denies specific chest pain or pressure.  PMHx:  Past Medical History:  Diagnosis Date  . High cholesterol   . Mood disorder [facility])     Past Surgical History:  Procedure Laterality Date  . COLECTOMY    . EUS N/A 11/29/2015   Procedure: LOWER ENDOSCOPIC ULTRASOUND (EUS);  Surgeon: Arta Silence, MD;  Location: [facility] ENDOSCOPY;  Service: Endoscopy;  Laterality: N/A;  . FLEXIBLE SIGMOIDOSCOPY N/A 01/27/2018   Procedure: FLEXIBLE SIGMOIDOSCOPY;  Surgeon: Otis Brace, MD;  Location: [place];  Service:  Gastroenterology;  Laterality: N/A;  . POLYPECTOMY  02/19/2016    FAMHx:  Family History  Problem Relation Age of Onset  . Heart attack Mother   . Cancer Father   . Cancer Sister 34  . Other Brother 10       meningitis     SOCHx:   reports that she has never smoked. She has never used smokeless tobacco. She reports that she drinks about 2.0 standard drinks of alcohol per week. She reports that she does not use drugs.  ALLERGIES:  No Known Allergies  ROS: Pertinent  items noted in HPI and remainder of comprehensive ROS otherwise negative.  HOME MEDS: Current Outpatient Medications on File Prior to Visit  Medication Sig Dispense Refill  . aspirin EC 81 MG tablet Take 81 mg by mouth at bedtime.    . Cholecalciferol (VITAMIN D3) 2000 UNITS TABS Take 2,000 Units by mouth daily.    Marland Kitchen FLUoxetine (PROZAC) 20 MG capsule Take 20 mg by mouth daily.     . Omega-3 Fatty Acids (FISH OIL) 1000 MG CAPS Take 1-4 capsules by mouth 2 (two) times daily. 1 capsule in the morning and 3 at night    . Pyridoxine HCl (B-6 PO) Take 1 tablet by mouth 2 (two) times daily.    . Turmeric 450 MG CAPS Take 1 capsule by mouth daily.    . vitamin B-12 (CYANOCOBALAMIN) 1000 MCG tablet Take 1,000 mcg by mouth daily.    Marland Kitchen ZETIA 10 MG tablet Take 10 mg by mouth daily.      No current facility-administered medications on file prior to visit.     LABS/IMAGING: No results found for this or any previous visit (from the past 48 hour(s)). No results found.  WEIGHTS: Wt Readings from Last 3 Encounters:  09/01/18 158 lb 6.4 oz (71.8 kg)  01/27/18 154 lb (69.9 kg)  11/11/16 154 lb (69.9 kg)    VITALS: BP 96/62 (BP Location: Right Arm, Patient Position: Sitting, Cuff Size: Large)   Pulse 63   Ht 5\' 4"  (1.626 m)   Wt 158 lb 6.4 oz (71.8 kg)   BMI 27.19 kg/m   EXAM: General appearance: alert and no distress Neck: no carotid bruit and no JVD Lungs: clear to auscultation bilaterally Heart: regular rate and rhythm, S1, S2 normal and systolic murmur: early systolic 3/6, crescendo at 2nd right intercostal space Abdomen: soft, non-tender; bowel sounds normal; no masses,  no organomegaly Extremities: extremities normal, atraumatic, no cyanosis or edema Pulses: 2+ and symmetric Skin: Skin color, texture, turgor normal. No rashes or lesions Neurologic: Grossly normal Psych: Pleasant  EKG: Normal sinus rhythm 63-personally reviewed  ASSESSMENT: 1. Fatigue, DOE, hypotension 2. Aortic  sclerosis 3. 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   Alexandria Wallace describes progressive fatigue, dyspnea on exertion and associated hypotension.  Echo recently showed aortic sclerosis with normal systolic function.  Lab work however is very concerning for probable familial hyperlipidemia.  In fact she tells me that she had had testing when she was in [place] that sounds like genetic testing, indicating that she probably did have FH.  She would try to get a hold of those records for [place].  This has implications as she does have 2 children who should consider testing.  It does not sound like her daughter however would be amenable to treatment since she is skeptical of statins.  Nonetheless, she needs significant reduction in LDL cholesterol.  I recommend starting Crestor 40 mg daily in addition  to ezetimibe.  Again she tells me she was not intolerant of it rather was convinced not to take it.  We will need to repeat a lipid profile in about 3 months.  I suspect she will need additional therapy including a PCSK9 inhibitor.  I would like to refer her for CT coronary angiography with FFR as I am concerned about the possibility for multivessel coronary disease as a cause of her fatigue, dyspnea on exertion and associated hypotension related to hypoperfusion with exertion.  Thanks again for the kind referral.  We will plan to see her back after her studies are complete.  Pixie Casino, MD, [facility], [place] Director of the Advanced Lipid Disorders &  Cardiovascular Risk Reduction Clinic Diplomate of the American Board of Clinical Lipidology Attending Cardiologist  Direct Dial: [phone]  Fax: [phone]  Website:  [email or web address] 09/01/2018, 12:01 PM

## 2018-09-01 NOTE — Patient Instructions (Addendum)
Medication Instructions:   START crestor 40mg  daily Continue other medications  Labwork:  NONE  Testing/Procedures:  Please arrive at the [facility] main entrance of [facility] at _______ AM/PM (30-45 minutes prior to test start time)  [facility] [place], [place] [address] [phone]  Proceed to the [facility] Radiology Department (First Floor).  Please follow these instructions carefully (unless otherwise directed):  Hold all erectile dysfunction medications at least 48 hours prior to test.  On the Night Before the Test: . Be sure to Drink plenty of water. . Do not consume any caffeinated/decaffeinated beverages or chocolate 12 hours prior to your test. . Do not take any antihistamines 12 hours prior to your test  On the Day of the Test: . Drink plenty of water. Do not drink any water within one hour of the test. . Do not eat any food 4 hours prior to the test. . You may take your regular medications prior to the test.  . Take metoprolol (Lopressor) two hours prior to test.       After the Test: . Drink plenty of water. . After receiving IV contrast, you may experience a mild flushed feeling. This is normal. . On occasion, you may experience a mild rash up to 24 hours after the test. This is not dangerous. If this occurs, you can take Benadryl 25 mg and increase your fluid intake. . If you experience trouble breathing, this can be serious. If it is severe call 911 IMMEDIATELY. If it is mild, please call our office. . If you take any of these medications: Glipizide/Metformin, Avandament, Glucavance, please do not take 48 hours after completing test.   Follow-Up:  Follow up with Dr. Debara Pickett after your CT test is completed  If you need a refill on your cardiac medications before your next appointment, please call your pharmacy.  Any Other Special Instructions Will Be Listed Below (If Applicable).

## 2018-09-05 ENCOUNTER — Other Ambulatory Visit: Payer: Self-pay | Admitting: Family Medicine

## 2018-09-05 ENCOUNTER — Ambulatory Visit: Payer: Medicare Other

## 2018-09-05 ENCOUNTER — Ambulatory Visit
Admission: RE | Admit: 2018-09-05 | Discharge: 2018-09-05 | Disposition: A | Discharge status: Home / Self Care | Payer: Medicare Other | Source: Ambulatory Visit | Attending: Family Medicine | Admitting: Family Medicine

## 2018-09-05 DIAGNOSIS — N644 Mastodynia: Secondary | ICD-10-CM

## 2018-09-07 ENCOUNTER — Telehealth: Payer: Self-pay | Admitting: Internal Medicine

## 2018-09-07 NOTE — Telephone Encounter (Signed)
Left message to call back  

## 2018-09-07 NOTE — Telephone Encounter (Signed)
received call back from patient, patient wondering if she needed to continue Zetia.   Advised per last OV, she should continue Zetia in addition to Crestor 40 mg.   Her PCP was wondering if Dr. Debara Pickett was going to repeat lab work or if PCP needed to do this.   Advised per note, labs should be repeated in 3 months.  Advised ok to have PCP if more convenient for her but to verify this would be sent to Dr. Debara Pickett to review.    Patient also wondering about the cardiac CTA.  Advised this can take a few weeks to precert and get scheduled, advised once approved by insurance someone will call to schedule this.    Patient aware and verbalized understanding.

## 2018-09-07 NOTE — Telephone Encounter (Signed)
New Message        Patient is calling to ask if she is suppose to continue the "Alexandria Wallace" also her primary "Dr. Theadore Nan is needing know if we are following up on the  labs for her  Lipids? Or is she ordering the follow up labs?

## 2018-09-17 LAB — BASIC METABOLIC PANEL
BUN/Creatinine Ratio: 28 (ref 12–28)
BUN: 23 mg/dL (ref 8–27)
CO2: 22 mmol/L (ref 20–29)
CREATININE: 0.82 mg/dL (ref 0.57–1.00)
Calcium: 10.5 mg/dL — ABNORMAL HIGH (ref 8.7–10.3)
Chloride: 103 mmol/L (ref 96–106)
GFR calc Af Amer: 81 mL/min/{1.73_m2} (ref >59)
GFR, EST NON AFRICAN AMERICAN: 70 mL/min/{1.73_m2} (ref >59)
Glucose: 84 mg/dL (ref 65–99)
Potassium: 4.2 mmol/L (ref 3.5–5.2)
SODIUM: 140 mmol/L (ref 134–144)

## 2018-09-22 ENCOUNTER — Ambulatory Visit (HOSPITAL_COMMUNITY)
Admission: RE | Admit: 2018-09-22 | Discharge: 2018-09-22 | Disposition: A | Discharge status: Home / Self Care | Payer: Medicare Other | Source: Ambulatory Visit | Attending: Internal Medicine | Admitting: Internal Medicine

## 2018-09-22 ENCOUNTER — Ambulatory Visit (HOSPITAL_COMMUNITY): Payer: Medicare Other

## 2018-09-22 DIAGNOSIS — R5383 Other fatigue: Secondary | ICD-10-CM | POA: Diagnosis present

## 2018-09-22 DIAGNOSIS — R0609 Other forms of dyspnea: Secondary | ICD-10-CM | POA: Diagnosis not present

## 2018-09-22 DIAGNOSIS — I700 Atherosclerosis of aorta: Secondary | ICD-10-CM | POA: Diagnosis not present

## 2018-09-22 DIAGNOSIS — E7849 Other hyperlipidemia: Secondary | ICD-10-CM | POA: Insufficient documentation

## 2018-09-22 MED ORDER — NITROGLYCERIN 0.4 MG SL SUBL
0.8000 mg | SUBLINGUAL_TABLET | SUBLINGUAL | Status: DC | PRN
  Administered 2018-09-22: 0.8 mg via SUBLINGUAL
  Filled 2018-09-22: qty 25

## 2018-09-22 MED ORDER — NITROGLYCERIN 0.4 MG SL SUBL
SUBLINGUAL_TABLET | SUBLINGUAL | Status: AC
  Filled 2018-09-22: qty 2

## 2018-09-22 MED ORDER — METOPROLOL TARTRATE 5 MG/5ML IV SOLN
5.0000 mg | INTRAVENOUS | Status: DC | PRN
  Filled 2018-09-22: qty 5

## 2018-09-22 MED ORDER — METOPROLOL TARTRATE 5 MG/5ML IV SOLN
INTRAVENOUS | Status: AC
  Filled 2018-09-22: qty 5

## 2018-09-22 MED ORDER — IOPAMIDOL (ISOVUE-370) INJECTION 76%
100.0000 mL | Freq: Once | INTRAVENOUS | Status: AC | PRN
  Administered 2018-09-22: 100 mL via INTRAVENOUS

## 2018-10-04 ENCOUNTER — Encounter: Payer: Self-pay | Admitting: Internal Medicine

## 2018-10-04 ENCOUNTER — Ambulatory Visit: Payer: Medicare Other | Admitting: Internal Medicine

## 2018-10-04 VITALS — BP 114/66 | HR 76 | Ht 64.0 in | Wt 163.0 lb

## 2018-10-04 DIAGNOSIS — I2510 Atherosclerotic heart disease of native coronary artery without angina pectoris: Secondary | ICD-10-CM

## 2018-10-04 DIAGNOSIS — R0609 Other forms of dyspnea: Secondary | ICD-10-CM

## 2018-10-04 DIAGNOSIS — R5383 Other fatigue: Secondary | ICD-10-CM

## 2018-10-04 DIAGNOSIS — E7849 Other hyperlipidemia: Secondary | ICD-10-CM

## 2018-10-04 NOTE — Patient Instructions (Signed)
Medication Instructions:  Continue current medications If you need a refill on your cardiac medications before your next appointment, please call your pharmacy.   Lab work: FASTING lab work in 2 months to check cholesterol If you have labs (blood work) drawn today and your tests are completely normal, you will receive your results only by: Marland Kitchen MyChart Message (if you have MyChart) OR . A paper copy in the mail If you have any lab test that is abnormal or we need to change your treatment, we will call you to review the results.  Follow-Up: At [facility], you and your health needs are our priority.  As part of our continuing mission to provide you with exceptional heart care, we have created designated Provider Care Teams.  These Care Teams include your primary Cardiologist (physician) and Advanced Practice Providers (APPs -  Physician Assistants and Nurse Practitioners) who all work together to provide you with the care you need, when you need it. . You will need a follow up appointment in 3 months with Dr. Debara Pickett.  Any Other Special Instructions Will Be Listed Below (If Applicable).

## 2018-10-04 NOTE — Progress Notes (Signed)
OFFICE NOTE  Chief Complaint:  Follow-up CT angiogram  Primary Care Physician: Cari Caraway, MD  HPI:  Alexandria Wallace is a 77 y.o. female who I previously saw in 2012 for evaluation of heart murmur. She underwent an echocardiogram at that time which showed an EF of greater than 62%, mild diastolic dysfunction, mild to moderate TR with an RVSP of 40 mmHg, and mild aortic sclerosis without stenosis. She also underwent a stress test which showed no reversible ischemia. She denies any chest pain or worsening shortness of breath. Recently she saw her primary care provider who noted a heart murmur which was not previously appreciated. She was referred for reevaluation. Ms. Alexandria Wallace reports doing well. She is quite active physically. Although she takes medications for mood disorder and has some mildly elevated cholesterol, she has no other cardiac issues that were aware of. Cholesterol is treated by her primary care provider and she is on zetia. Her mother died at age 55 of an MI but there is no other coronary disease in the family.  09/01/2018  Ms. Alexandria Wallace returns today for follow-up.  I have only seen her twice in the last 7 years.  Initially in 2012 for evaluation of heart murmur.  Echo showed some TR and mild aortic sclerosis.  I last saw her in 2017 and a repeat echo in 2018 showed mild aortic sclerosis with normal systolic function and no significant valvular abnormalities.  She mentioned that she had a history of elevated cholesterol in the past but was hesitant to take statins.  She denied any history of heart disease in her family and noted that her mother died at age 50 and did have an MRI.  Her father actually died in his 32s from cancer and is unclear whether he had any coronary disease.  I did receive lab work from her PCP at [facility], Dr. Addison Lank that indicated a severe dyslipidemia.  In fact in June 2019 her total cholesterol was 321, triglycerides 144, HDL 56 and LDL-C of 236.   Non-HDL cholesterol 265.  This is in the setting of ezetimibe.  When I press her more on this she says that actually she was given the diagnosis of a genetic dyslipidemia when she spent some time in [place].  Her daughter apparently is an Systems developer and also practices herbal medications and has convinced her that statin therapy is potentially harmful.  Ms. Alexandria Wallace was on Crestor for short period of time and apparently tolerated it with an appropriate decrease in her cholesterol.  These findings are highly suggestive of FH and certainly raise my concern that her symptoms may be due to significant coronary artery disease.  She had last had stress testing back in 2012 which was negative for ischemia.  Currently she gets short of breath when doing moderate to significant exertion, such as walking upstairs or doing water aerobics.  She also notes an associated drop in blood pressure with these episodes.  She denies specific chest pain or pressure.  10/04/2018  [name] returns today for follow-up of her CT angiogram.  As noted above she has a significant history of dyslipidemia with likely FH and has recently had shortness of breath with moderate to significant exertion.  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  There is evidence of collaterals from the distal LAD.  The left coronary arteries indicated mild to moderate  nonobstructive coronary disease with mixed soft and calcified plaque.  The LAD is noted to be a large vessel that wraps around the apex.  A PFO is suspected with left-to-right shunt.  Overall the findings could explain her symptoms however currently she is free of angina.  Her dyspnea is fairly stable and tolerated.  She is started taking Crestor but does feel that she has some side effects including cramping which is been intermittent.  She would  like to lower her dose.  She understands that she is way above her goal LDL less than 70 and will likely need additional medication.  Because of the abnormal CT findings, we discussed possible heart catheterization for definitive evaluation of her symptoms.  Although the right coronary could be occluded this appears to be a small, nondominant vessel and intervention would not likely be beneficial.  PMHx:  Past Medical History:  Diagnosis Date  . High cholesterol   . Mood disorder [facility])     Past Surgical History:  Procedure Laterality Date  . COLECTOMY    . EUS N/A 11/29/2015   Procedure: LOWER ENDOSCOPIC ULTRASOUND (EUS);  Surgeon: Arta Silence, MD;  Location: [facility] ENDOSCOPY;  Service: Endoscopy;  Laterality: N/A;  . FLEXIBLE SIGMOIDOSCOPY N/A 01/27/2018   Procedure: FLEXIBLE SIGMOIDOSCOPY;  Surgeon: Alexandria Brace, MD;  Location: [place];  Service: Gastroenterology;  Laterality: N/A;  . POLYPECTOMY  02/19/2016    FAMHx:  Family History  Problem Relation Age of Onset  . Heart attack Mother   . Cancer Father   . Cancer Sister 64  . Other Brother 10       meningitis     SOCHx:   reports that she has never smoked. She has never used smokeless tobacco. She reports that she drinks about 2.0 standard drinks of alcohol per week. She reports that she does not use drugs.  ALLERGIES:  No Known Allergies  ROS: Pertinent items noted in HPI and remainder of comprehensive ROS otherwise negative.  HOME MEDS: Current Outpatient Medications on File Prior to Visit  Medication Sig Dispense Refill  . aspirin EC 81 MG tablet Take 81 mg by mouth at bedtime.    . Cholecalciferol (VITAMIN D3) 2000 UNITS TABS Take 2,000 Units by mouth daily.    . Coenzyme Q10 (CO Q10) 100 MG CAPS Take 100 mg by mouth daily.    Marland Kitchen FLUoxetine (PROZAC) 20 MG capsule Take 20 mg by mouth daily.     . metoprolol tartrate (LOPRESSOR) 50 MG tablet Take ONE tablet ONE HOUR prior to test. 1 tablet 0  . Omega-3 Fatty  Acids (FISH OIL) 1000 MG CAPS Take 1-4 capsules by mouth 2 (two) times daily. 1 capsule in the morning and 3 at night    . Pyridoxine HCl (B-6 PO) Take 1 tablet by mouth 2 (two) times daily.    . rosuvastatin (CRESTOR) 40 MG tablet Take 1 tablet (40 mg total) by mouth daily. 90 tablet 3  . Turmeric 450 MG CAPS Take 1 capsule by mouth daily.    . vitamin B-12 (CYANOCOBALAMIN) 1000 MCG tablet Take 1,000 mcg by mouth daily.    Marland Kitchen ZETIA 10 MG tablet Take 10 mg by mouth daily.      No current facility-administered medications on file prior to visit.     LABS/IMAGING: No results found for this or any previous visit (from the past 48 hour(s)). No results found.  WEIGHTS: Wt Readings from Last 3 Encounters:  10/04/18 163 lb (73.9 kg)  09/01/18 158 lb 6.4 oz (71.8 kg)  01/27/18 154 lb (69.9 kg)    VITALS: BP 114/66 (BP Location: Left Arm, Patient Position: Sitting, Cuff Size: Normal)   Pulse 76   Ht 5\' 4"  (1.626 m)   Wt 163 lb (73.9 kg)   BMI 27.98 kg/m   EXAM: Deferred  EKG: Deferred  ASSESSMENT: 1. 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 Aortic sclerosis 4. 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   Mrs. Havrilla have an abnormal coronary CT angiogram with mild to moderate nonobstructive coronary disease and a diminutive right coronary artery which appears to be either occluded or anomalous with distal reconstitution and collaterals from the LAD and circumflex.  These findings are not likely to explain her exertional dyspnea.  She does have likely PFO with left-to-right shunt based on the images as well.  Her cholesterol is not at goal.  She apparently having side effects on rosuvastatin 40 and wishes to reduce the dose to 20 mg.  I am okay with that and she should continue on ezetimibe.  She will not likely reach  goal on either dose of medication and is a good candidate for PCSK9 inhibitor.  Based on her symptoms of dyspnea and fatigue we discussed heart catheterization, however she said she is not interested in that.  In addition, I do not think that there are any significant targets for intervention based on her CT angiographic findings.  Plan follow-up with me in a few months after repeat lipid profile to discuss further treatments.  Pixie Casino, MD, [facility], [place] Director of the Advanced Lipid Disorders &  Cardiovascular Risk Reduction Clinic Diplomate of the American Board of Clinical Lipidology Attending Cardiologist  Direct Dial: [phone]  Fax: [phone]  Website:  [email or web address] 10/04/2018, 3:16 PM

## 2018-11-10 ENCOUNTER — Ambulatory Visit: Payer: Medicare Other | Admitting: Internal Medicine

## 2018-11-10 ENCOUNTER — Telehealth: Payer: Self-pay | Admitting: Internal Medicine

## 2018-11-10 ENCOUNTER — Encounter: Payer: Self-pay | Admitting: Internal Medicine

## 2018-11-10 VITALS — BP 118/72 | HR 84 | Ht 64.0 in | Wt 162.4 lb

## 2018-11-10 DIAGNOSIS — R0600 Dyspnea, unspecified: Secondary | ICD-10-CM

## 2018-11-10 DIAGNOSIS — I200 Unstable angina: Secondary | ICD-10-CM

## 2018-11-10 DIAGNOSIS — R079 Chest pain, unspecified: Secondary | ICD-10-CM

## 2018-11-10 DIAGNOSIS — Z01812 Encounter for preprocedural laboratory examination: Secondary | ICD-10-CM

## 2018-11-10 DIAGNOSIS — I2510 Atherosclerotic heart disease of native coronary artery without angina pectoris: Secondary | ICD-10-CM

## 2018-11-10 NOTE — Telephone Encounter (Signed)
New message    Pt is calling asking for a call back. She has a question about after her procedure next Tuesday.

## 2018-11-10 NOTE — Telephone Encounter (Signed)
Returned call to patient no answer.LMTC. 

## 2018-11-10 NOTE — Telephone Encounter (Signed)
Received call back from patient she stated she will be having a cardiac cath next week.Stated her paper work says someone will need to stay with her the first 24 hours.Stated she may not have any one to stay with her.Advised it is recommended.

## 2018-11-10 NOTE — Progress Notes (Signed)
OFFICE NOTE  Chief Complaint:  Chest pain  Primary Care Physician: Cari Caraway, MD  HPI:  Alexandria Wallace is a 77 y.o. female who I previously saw in 2012 for evaluation of heart murmur. She underwent an echocardiogram at that time which showed an EF of greater than 26%, mild diastolic dysfunction, mild to moderate TR with an RVSP of 40 mmHg, and mild aortic sclerosis without stenosis. She also underwent a stress test which showed no reversible ischemia. She denies any chest pain or worsening shortness of breath. Recently she saw her primary care provider who noted a heart murmur which was not previously appreciated. She was referred for reevaluation. [name] reports doing well. She is quite active physically. Although she takes medications for mood disorder and has some mildly elevated cholesterol, she has no other cardiac issues that were aware of. Cholesterol is treated by her primary care provider and she is on zetia. Her mother died at age 68 of an MI but there is no other coronary disease in the family.  09/01/2018  Ms. Tsan returns today for follow-up.  I have only seen her twice in the last 7 years.  Initially in 2012 for evaluation of heart murmur.  Echo showed some TR and mild aortic sclerosis.  I last saw her in 2017 and a repeat echo in 2018 showed mild aortic sclerosis with normal systolic function and no significant valvular abnormalities.  She mentioned that she had a history of elevated cholesterol in the past but was hesitant to take statins.  She denied any history of heart disease in her family and noted that her mother died at age 36 and did have an MRI.  Her father actually died in his 59s from cancer and is unclear whether he had any coronary disease.  I did receive lab work from her PCP at [facility], Dr. Addison Lank that indicated a severe dyslipidemia.  In fact in June 2019 her total cholesterol was 321, triglycerides 144, HDL 56 and LDL-C of 236.  Non-HDL  cholesterol 265.  This is in the setting of ezetimibe.  When I press her more on this she says that actually she was given the diagnosis of a genetic dyslipidemia when she spent some time in [place].  Her daughter apparently is an Systems developer and also practices herbal medications and has convinced her that statin therapy is potentially harmful.  Ms. Douglass was on Crestor for short period of time and apparently tolerated it with an appropriate decrease in her cholesterol.  These findings are highly suggestive of FH and certainly raise my concern that her symptoms may be due to significant coronary artery disease.  She had last had stress testing back in 2012 which was negative for ischemia.  Currently she gets short of breath when doing moderate to significant exertion, such as walking upstairs or doing water aerobics.  She also notes an associated drop in blood pressure with these episodes.  She denies specific chest pain or pressure.  10/04/2018  Ms. Grisanti returns today for follow-up of her CT angiogram.  As noted above she has a significant history of dyslipidemia with likely FH and has recently had shortness of breath with moderate to significant exertion.  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  There is evidence of collaterals from the distal LAD.  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  The LAD is noted to be a large vessel that wraps around the apex.  A PFO is suspected with left-to-right shunt.  Overall the findings could explain her symptoms however currently she is free of angina.  Her dyspnea is fairly stable and tolerated.  She is started taking Crestor but does feel that she has some side effects including cramping which is been intermittent.  She would like to  lower her dose.  She understands that she is way above her goal LDL less than 70 and will likely need additional medication.  Because of the abnormal CT findings, we discussed possible heart catheterization for definitive evaluation of her symptoms.  Although the right coronary could be occluded this appears to be a small, nondominant vessel and intervention would not likely be beneficial.  11/10/2018  Ms. Chavana is seen today in follow-up.  This is an unscheduled visit for chest pain.  Recently as described above we had performed a CT coronary angiogram which did show either anomalous right coronary artery or possibly obstruction with collaterals that seem to come from the distal LAD.  It was unclear whether there was significant obstruction.  At that time she had been having fatigue and shortness of breath.  Now she reports her shortness of breath is worsened and she developed chest pain.  This is described as sharp and lasted for several minutes across her back and shoulders, while exerting and exercising.  Apparently she was doing some lap pull downs suggesting this could be musculoskeletal, but she has had some other episodes with exertion which were concerning.  Overall she feels unwell and thinks that her symptoms are progressively worsening.  Her EKG however is normal today.  PMHx:  Past Medical History:  Diagnosis Date  . High cholesterol   . Mood disorder [facility])     Past Surgical History:  Procedure Laterality Date  . COLECTOMY    . EUS N/A 11/29/2015   Procedure: LOWER ENDOSCOPIC ULTRASOUND (EUS);  Surgeon: Arta Silence, MD;  Location: [facility] ENDOSCOPY;  Service: Endoscopy;  Laterality: N/A;  . FLEXIBLE SIGMOIDOSCOPY N/A 01/27/2018   Procedure: FLEXIBLE SIGMOIDOSCOPY;  Surgeon: Otis Brace, MD;  Location: [facility];  Service: Gastroenterology;  Laterality: N/A;  . POLYPECTOMY  02/19/2016    FAMHx:  Family History  Problem Relation Age of Onset  . Heart attack Mother   .  Cancer Father   . Cancer Sister 70  . Other Brother 10       meningitis     SOCHx:   reports that she has never smoked. She has never used smokeless tobacco. She reports current alcohol use of about 2.0 standard drinks of alcohol per week. She reports that she does not use drugs.  ALLERGIES:  No Known Allergies  ROS: Pertinent items noted in HPI and remainder of comprehensive ROS otherwise negative.  HOME MEDS: Current Outpatient Medications on File Prior to Visit  Medication Sig Dispense Refill  . aspirin EC 81 MG tablet Take 81 mg by mouth at bedtime.    . Cholecalciferol (VITAMIN D3) 2000 UNITS TABS Take 2,000 Units by mouth daily.    . Coenzyme Q10 (CO Q10) 100 MG CAPS Take 100 mg by mouth daily.    Marland Kitchen FLUoxetine (PROZAC) 20 MG capsule Take 20 mg by mouth daily.     . metoprolol tartrate (LOPRESSOR) 50 MG tablet Take ONE tablet ONE HOUR prior to test. 1 tablet 0  . Omega-3 Fatty  Acids (FISH OIL) 1000 MG CAPS Take 1-4 capsules by mouth 2 (two) times daily. 1 capsule in the morning and 3 at night    . rosuvastatin (CRESTOR) 40 MG tablet Take 1 tablet (40 mg total) by mouth daily. 90 tablet 3  . Turmeric 450 MG CAPS Take 1 capsule by mouth daily.    . vitamin B-12 (CYANOCOBALAMIN) 1000 MCG tablet Take 1,000 mcg by mouth daily.    Marland Kitchen ZETIA 10 MG tablet Take 10 mg by mouth daily.      No current facility-administered medications on file prior to visit.     LABS/IMAGING: No results found for this or any previous visit (from the past 48 hour(s)). No results found.  WEIGHTS: Wt Readings from Last 3 Encounters:  11/10/18 162 lb 6.4 oz (73.7 kg)  10/04/18 163 lb (73.9 kg)  09/01/18 158 lb 6.4 oz (71.8 kg)    VITALS: BP 118/72   Pulse 84   Ht 5\' 4"  (1.626 m)   Wt 162 lb 6.4 oz (73.7 kg)   BMI 27.88 kg/m   EXAM: General appearance: alert and no distress Neck: no JVD, supple, symmetrical, trachea midline and thyroid not enlarged, symmetric, no tenderness/mass/nodules Lungs:  clear to auscultation bilaterally Heart: regular rate and rhythm Abdomen: soft, non-tender; bowel sounds normal; no masses,  no organomegaly Extremities: extremities normal, atraumatic, no cyanosis or edema Pulses: 2+ and symmetric Skin: Skin color, texture, turgor normal. No rashes or lesions Neurologic: Grossly normal Psych: Pleasant  EKG: Normal sinus rhythm 84-personally reviewed  ASSESSMENT: 1. Unstable angina 2. 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   Mrs. Hanken is now describing some symptoms concerning for unstable angina.  She is also had progressive dyspnea on exertion and worsening fatigue.  Her CT coronary angiogram is abnormal although only suggested mild to moderate coronary disease and a possibly occluded right coronary.  It is unclear whether this is congenitally abnormal or perhaps is proximally partially occluded.  This could be explaining her symptoms.  A definitive heart catheterization is now indicated given her worsening symptoms.  I did discuss the risk, benefits and alternatives of the procedure with her in depth today and she is willing to proceed.  We will try to schedule that soon before the holidays.  She is planning on going out of town afterwards.  She is currently on adequate medical therapy including aspirin, beta-blocker, statin and has been given nitroglycerin to use as needed by her PCP.  I explained how and when to use it.  Plan follow-up with me after heart catheterization.  Pixie Casino, MD, [facility], [place] Director of the Advanced Lipid Disorders &  Cardiovascular Risk Reduction Clinic Diplomate of the American Board of Clinical Lipidology Attending Cardiologist  Direct Dial:  [phone]  Fax: [phone]  Website:  [email or web address] 11/10/2018, 8:16 AM

## 2018-11-10 NOTE — Patient Instructions (Signed)
Medication Instructions:  Continue current medications If you need a refill on your cardiac medications before your next appointment, please call your pharmacy.   Lab work: Pre-procedure lab work to be completed today 11/10/18 (BMET, CBC) If you have labs (blood work) drawn today and your tests are completely normal, you will receive your results only by: Marland Kitchen MyChart Message (if you have MyChart) OR . A paper copy in the mail If you have any lab test that is abnormal or we need to change your treatment, we will call you to review the results.  Testing/Procedures: Dr. Debara Pickett has recommended that you have a heart catheterization. This is done at [facility]. Instructions are provided below.  Follow-Up: At [facility], you and your health needs are our priority.  As part of our continuing mission to provide you with exceptional heart care, we have created designated Provider Care Teams.  These Care Teams include your primary Cardiologist (physician) and Advanced Practice Providers (APPs -  Physician Assistants and Nurse Practitioners) who all work together to provide you with the care you need, when you need it. You will need a follow up appointment in 4 weeks after heart cath procedure. You may see Dr. Debara Pickett or one of the following Advanced Practice Providers on your designated Care Team: Almyra Deforest, [place] . Fabian Sharp, PA-C  Any Other Special Instructions Will Be Listed Below (If Applicable).     [address] [place] [address] Dept: [phone] Loc: [place]  11/10/2018  You are scheduled for a Cardiac Catheterization on Tuesday, December 17 with Dr. Shelva Majestic.  1. Please arrive at the [facility] (Main Entrance A) at [facility]: [address] [place], [place] [address] at 5:30 AM (This time is two hours before your procedure to ensure your  preparation). Free valet parking service is available.   Special note: Every effort is made to have your procedure done on time. Please understand that emergencies sometimes delay scheduled procedures.  2. Diet: Do not eat solid foods after midnight.  You may have clear liquids (water, juice, broth) until 5am upon the day of the procedure.  3. Labs: You will need to have blood drawn on TODAY. You do not need to be fasting.  4. Medication instructions in preparation for your procedure:  On the morning of your procedure, take your Aspirin and any morning medicines NOT listed above.  You may use sips of water.  5. Plan for one night stay--bring personal belongings. 6. Bring a current list of your medications and current insurance cards. 7. You MUST have a responsible person to drive you home. 8. Someone MUST be with you the first 24 hours after you arrive home or your discharge will be delayed. 9. Please wear clothes that are easy to get on and off and wear slip-on shoes.  Thank you for allowing [place] to care for you!   -- [facility]

## 2018-11-11 LAB — CBC
Hematocrit: 36.8 % (ref 34.0–46.6)
Hemoglobin: 12.3 g/dL (ref 11.1–15.9)
MCH: 31.4 pg (ref 26.6–33.0)
MCHC: 33.4 g/dL (ref 31.5–35.7)
MCV: 94 fL (ref 79–97)
PLATELETS: 265 10*3/uL (ref 150–450)
RBC: 3.92 x10E6/uL (ref 3.77–5.28)
RDW: 12.6 % (ref 12.3–15.4)
WBC: 5.8 10*3/uL (ref 3.4–10.8)

## 2018-11-11 LAB — BASIC METABOLIC PANEL
BUN/Creatinine Ratio: 16 (ref 12–28)
BUN: 14 mg/dL (ref 8–27)
CHLORIDE: 102 mmol/L (ref 96–106)
CO2: 24 mmol/L (ref 20–29)
Calcium: 10.1 mg/dL (ref 8.7–10.3)
Creatinine, Ser: 0.85 mg/dL (ref 0.57–1.00)
GFR calc Af Amer: 78 mL/min/{1.73_m2} (ref >59)
GFR, EST NON AFRICAN AMERICAN: 67 mL/min/{1.73_m2} (ref >59)
GLUCOSE: 100 mg/dL — AB (ref 65–99)
POTASSIUM: 4.8 mmol/L (ref 3.5–5.2)
Sodium: 142 mmol/L (ref 134–144)

## 2018-11-14 ENCOUNTER — Telehealth: Payer: Self-pay | Admitting: *Deleted

## 2018-11-14 NOTE — Telephone Encounter (Signed)
Pt contacted pre-catheterization scheduled at [facility] for: Tuesday November 15, 2018 7:30 AM Verified arrival time and place: [place] Entrance A at: 5:30 AM  No solid food after midnight prior to cath, clear liquids until 5 AM day of procedure. Contrast allergy: no Verified no diabetes medications.  AM meds can be  taken pre-cath with sip of water including: ASA 81 mg  Confirmed patient has responsible person to drive home post procedure and for 24 hours after you arrive home: yes

## 2018-11-15 ENCOUNTER — Encounter (HOSPITAL_COMMUNITY)
Admission: RE | Disposition: A | Discharge status: Home / Self Care | Payer: Self-pay | Source: Home / Self Care | Attending: Cardiovascular Disease

## 2018-11-15 ENCOUNTER — Ambulatory Visit (HOSPITAL_COMMUNITY)
Admission: RE | Admit: 2018-11-15 | Discharge: 2018-11-15 | Disposition: A | Discharge status: Home / Self Care | Payer: Medicare Other | Attending: Cardiovascular Disease | Admitting: Cardiovascular Disease

## 2018-11-15 DIAGNOSIS — R5383 Other fatigue: Secondary | ICD-10-CM | POA: Insufficient documentation

## 2018-11-15 DIAGNOSIS — E7800 Pure hypercholesterolemia, unspecified: Secondary | ICD-10-CM | POA: Diagnosis not present

## 2018-11-15 DIAGNOSIS — Z7982 Long term (current) use of aspirin: Secondary | ICD-10-CM | POA: Insufficient documentation

## 2018-11-15 DIAGNOSIS — R931 Abnormal findings on diagnostic imaging of heart and coronary circulation: Secondary | ICD-10-CM

## 2018-11-15 DIAGNOSIS — R0609 Other forms of dyspnea: Secondary | ICD-10-CM | POA: Diagnosis present

## 2018-11-15 DIAGNOSIS — I2584 Coronary atherosclerosis due to calcified coronary lesion: Secondary | ICD-10-CM | POA: Diagnosis not present

## 2018-11-15 DIAGNOSIS — I2510 Atherosclerotic heart disease of native coronary artery without angina pectoris: Secondary | ICD-10-CM

## 2018-11-15 DIAGNOSIS — I200 Unstable angina: Secondary | ICD-10-CM | POA: Diagnosis not present

## 2018-11-15 DIAGNOSIS — Z79899 Other long term (current) drug therapy: Secondary | ICD-10-CM | POA: Insufficient documentation

## 2018-11-15 DIAGNOSIS — R0600 Dyspnea, unspecified: Secondary | ICD-10-CM | POA: Diagnosis not present

## 2018-11-15 DIAGNOSIS — Z8249 Family history of ischemic heart disease and other diseases of the circulatory system: Secondary | ICD-10-CM | POA: Insufficient documentation

## 2018-11-15 HISTORY — PX: LEFT HEART CATH AND CORONARY ANGIOGRAPHY: CATH118249

## 2018-11-15 SURGERY — LEFT HEART CATH AND CORONARY ANGIOGRAPHY
Anesthesia: LOCAL

## 2018-11-15 MED ORDER — VERAPAMIL HCL 2.5 MG/ML IV SOLN
INTRAVENOUS | Status: DC | PRN
  Administered 2018-11-15: 10 mL via INTRA_ARTERIAL

## 2018-11-15 MED ORDER — SODIUM CHLORIDE 0.9 % IV SOLN
INTRAVENOUS | Status: DC
  Administered 2018-11-15: 09:00:00 via INTRAVENOUS

## 2018-11-15 MED ORDER — HEPARIN (PORCINE) IN NACL 1000-0.9 UT/500ML-% IV SOLN
INTRAVENOUS | Status: DC | PRN
  Administered 2018-11-15 (×2): 500 mL

## 2018-11-15 MED ORDER — HEPARIN SODIUM (PORCINE) 1000 UNIT/ML IJ SOLN
INTRAMUSCULAR | Status: AC
  Filled 2018-11-15: qty 1

## 2018-11-15 MED ORDER — VERAPAMIL HCL 2.5 MG/ML IV SOLN
INTRAVENOUS | Status: AC
  Filled 2018-11-15: qty 2

## 2018-11-15 MED ORDER — HEPARIN SODIUM (PORCINE) 1000 UNIT/ML IJ SOLN
INTRAMUSCULAR | Status: DC | PRN
  Administered 2018-11-15: 3500 [IU] via INTRAVENOUS

## 2018-11-15 MED ORDER — FENTANYL CITRATE (PF) 100 MCG/2ML IJ SOLN
INTRAMUSCULAR | Status: AC
  Filled 2018-11-15: qty 2

## 2018-11-15 MED ORDER — LIDOCAINE HCL (PF) 1 % IJ SOLN
INTRAMUSCULAR | Status: AC
  Filled 2018-11-15: qty 30

## 2018-11-15 MED ORDER — SODIUM CHLORIDE 0.9% FLUSH: 3.0000 mL | INTRAVENOUS | Status: DC | PRN

## 2018-11-15 MED ORDER — FENTANYL CITRATE (PF) 100 MCG/2ML IJ SOLN
INTRAMUSCULAR | Status: DC | PRN
  Administered 2018-11-15: 25 ug via INTRAVENOUS

## 2018-11-15 MED ORDER — LIDOCAINE HCL (PF) 1 % IJ SOLN
INTRAMUSCULAR | Status: DC | PRN
  Administered 2018-11-15: 2 mL

## 2018-11-15 MED ORDER — MIDAZOLAM HCL 2 MG/2ML IJ SOLN
INTRAMUSCULAR | Status: AC
  Filled 2018-11-15: qty 2

## 2018-11-15 MED ORDER — ASPIRIN 81 MG PO CHEW: 81.0000 mg | CHEWABLE_TABLET | ORAL | Status: DC

## 2018-11-15 MED ORDER — HEPARIN (PORCINE) IN NACL 1000-0.9 UT/500ML-% IV SOLN
INTRAVENOUS | Status: AC
  Filled 2018-11-15: qty 1000

## 2018-11-15 MED ORDER — IOHEXOL 350 MG/ML SOLN
INTRAVENOUS | Status: DC | PRN
  Administered 2018-11-15: 75 mL via INTRAVENOUS

## 2018-11-15 MED ORDER — MIDAZOLAM HCL 2 MG/2ML IJ SOLN
INTRAMUSCULAR | Status: DC | PRN
  Administered 2018-11-15: 1 mg via INTRAVENOUS

## 2018-11-15 SURGICAL SUPPLY — 14 items
CATH INFINITI 5 FR AR1 MOD (CATHETERS) ×1 IMPLANT
CATH INFINITI 5FR ANG PIGTAIL (CATHETERS) ×1 IMPLANT
CATH OPTITORQUE TIG 4.0 5F (CATHETERS) ×1 IMPLANT
DEVICE RAD COMP TR BAND LRG (VASCULAR PRODUCTS) ×2 IMPLANT
GLIDESHEATH SLEND SS 6F .021 (SHEATH) ×2 IMPLANT
GUIDEWIRE INQWIRE 1.5J.035X260 (WIRE) IMPLANT
INQWIRE 1.5J .035X260CM (WIRE) ×2
KIT HEART LEFT (KITS) ×2 IMPLANT
PACK CARDIAC CATHETERIZATION (CUSTOM PROCEDURE TRAY) ×2 IMPLANT
SHEATH PROBE COVER 6X72 (BAG) ×2 IMPLANT
SYR MEDRAD MARK 7 150ML (SYRINGE) ×2 IMPLANT
TRANSDUCER W/STOPCOCK (MISCELLANEOUS) ×2 IMPLANT
TUBING CIL FLEX 10 FLL-RA (TUBING) ×2 IMPLANT
WIRE HI TORQ VERSACORE-J 145CM (WIRE) ×1 IMPLANT

## 2018-11-15 NOTE — Discharge Instructions (Signed)

## 2018-11-16 ENCOUNTER — Encounter (HOSPITAL_COMMUNITY): Payer: Self-pay | Admitting: Cardiovascular Disease

## 2018-11-17 ENCOUNTER — Telehealth: Payer: Self-pay | Admitting: Internal Medicine

## 2018-11-17 NOTE — Telephone Encounter (Signed)
New Message    Pt states she had a heart cath yesterday and wants to know if she can go swimming. Please call

## 2018-11-17 NOTE — Telephone Encounter (Signed)
Patient advised to wait the 5 days instructed regarding lifting anything heavy as the resistance of water contributes . She had no intervention done.

## 2018-12-08 ENCOUNTER — Telehealth: Payer: Self-pay | Admitting: Internal Medicine

## 2018-12-08 NOTE — Telephone Encounter (Signed)
New Message   Patient is calling because she has an appt on 01/17. She states that Dr. Debara Pickett wants her to come in for labs. But today she saw her PCP and they did labs. So she is wondering if she needs to do labs here still. Please call to discuss.

## 2018-12-08 NOTE — Telephone Encounter (Signed)
Spoke with patient who reports she had labs by PCP today. She will receive results on Monday 1/13. Advised no new labs are needed per Dr. Debara Pickett as long as her labs from PCP include a lipid panel.

## 2018-12-14 ENCOUNTER — Other Ambulatory Visit: Payer: Self-pay | Admitting: Family Medicine

## 2018-12-14 DIAGNOSIS — M8580 Other specified disorders of bone density and structure, unspecified site: Secondary | ICD-10-CM

## 2018-12-16 ENCOUNTER — Encounter: Payer: Self-pay | Admitting: Internal Medicine

## 2018-12-16 ENCOUNTER — Ambulatory Visit: Payer: Medicare Other | Admitting: Internal Medicine

## 2018-12-16 VITALS — BP 108/60 | HR 80 | Ht 64.0 in | Wt 166.2 lb

## 2018-12-16 DIAGNOSIS — I2510 Atherosclerotic heart disease of native coronary artery without angina pectoris: Secondary | ICD-10-CM | POA: Diagnosis not present

## 2018-12-16 DIAGNOSIS — R0609 Other forms of dyspnea: Secondary | ICD-10-CM | POA: Diagnosis not present

## 2018-12-16 DIAGNOSIS — E7849 Other hyperlipidemia: Secondary | ICD-10-CM | POA: Diagnosis not present

## 2018-12-16 NOTE — Patient Instructions (Signed)
Medication Instructions:  Continue current medications If you need a refill on your cardiac medications before your next appointment, please call your pharmacy.   Follow-Up: At [facility], you and your health needs are our priority.  As part of our continuing mission to provide you with exceptional heart care, we have created designated Provider Care Teams.  These Care Teams include your primary Cardiologist (physician) and Advanced Practice Providers (APPs -  Physician Assistants and Nurse Practitioners) who all work together to provide you with the care you need, when you need it. You will need a follow up appointment in 6 months.  Please call our office 2 months in advance to schedule this appointment.  You may see Dr. Hilty or one of the following Advanced Practice Providers on your designated Care Team: Hao Meng, PA-C . Angela Duke, PA-C  Any Other Special Instructions Will Be Listed Below (If Applicable).    

## 2018-12-16 NOTE — Progress Notes (Signed)
OFFICE NOTE  Chief Complaint:  Follow-up heart cath  Primary Care Physician: Alexandria Caraway, MD  HPI:  Alexandria Wallace is a 78 y.o. female who I previously saw in 2012 for evaluation of heart murmur. She underwent an echocardiogram at that time which showed an EF of greater than 57%, mild diastolic dysfunction, mild to moderate TR with an RVSP of 40 mmHg, and mild aortic sclerosis without stenosis. She also underwent a stress test which showed no reversible ischemia. She denies any chest pain or worsening shortness of breath. Recently she saw her primary care provider who noted a heart murmur which was not previously appreciated. She was referred for reevaluation. Alexandria Wallace reports doing well. She is quite active physically. Although she takes medications for mood disorder and has some mildly elevated cholesterol, she has no other cardiac issues that were aware of. Cholesterol is treated by her primary care provider and she is on zetia. Her mother died at age 3 of an MI but there is no other coronary disease in the family.  09/01/2018  Alexandria Wallace returns today for follow-up.  I have only seen her twice in the last 7 years.  Initially in 2012 for evaluation of heart murmur.  Echo showed some TR and mild aortic sclerosis.  I last saw her in 2017 and a repeat echo in 2018 showed mild aortic sclerosis with normal systolic function and no significant valvular abnormalities.  She mentioned that she had a history of elevated cholesterol in the past but was hesitant to take statins.  She denied any history of heart disease in her family and noted that her mother died at age 46 and did have an MRI.  Her father actually died in his 49s from cancer and is unclear whether he had any coronary disease.  I did receive lab work from her PCP at [facility], Dr. Addison Wallace that indicated a severe dyslipidemia.  In fact in June 2019 her total cholesterol was 321, triglycerides 144, HDL 56 and LDL-C of 236.  Non-HDL  cholesterol 265.  This is in the setting of ezetimibe.  When I press her more on this she says that actually she was given the diagnosis of a genetic dyslipidemia when she spent some time in [place].  Her daughter apparently is an Systems developer and also practices herbal medications and has convinced her that statin therapy is potentially harmful.  Alexandria Wallace was on Crestor for short period of time and apparently tolerated it with an appropriate decrease in her cholesterol.  These findings are highly suggestive of FH and certainly raise my concern that her symptoms may be due to significant coronary artery disease.  She had last had stress testing back in 2012 which was negative for ischemia.  Currently she gets short of breath when doing moderate to significant exertion, such as walking upstairs or doing water aerobics.  She also notes an associated drop in blood pressure with these episodes.  She denies specific chest pain or pressure.  10/04/2018  Alexandria Wallace returns today for follow-up of her CT angiogram.  As noted above she has a significant history of dyslipidemia with likely FH and has recently had shortness of breath with moderate to significant exertion.  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  There is evidence of collaterals from the distal LAD.  The left coronary arteries indicated mild to moderate  nonobstructive coronary disease with mixed soft and calcified plaque.  The LAD is noted to be a large vessel that wraps around the apex.  A PFO is suspected with left-to-right shunt.  Overall the findings could explain her symptoms however currently she is free of angina.  Her dyspnea is fairly stable and tolerated.  She is started taking Crestor but does feel that she has some side effects including cramping which is been intermittent.  She would like to  lower her dose.  She understands that she is way above her goal LDL less than 70 and will likely need additional medication.  Because of the abnormal CT findings, we discussed possible heart catheterization for definitive evaluation of her symptoms.  Although the right coronary could be occluded this appears to be a small, nondominant vessel and intervention would not likely be beneficial.  11/10/2018  Alexandria Wallace is seen today in follow-up.  This is an unscheduled visit for chest pain.  Recently as described above we had performed a CT coronary angiogram which did show either anomalous right coronary artery or possibly obstruction with collaterals that seem to come from the distal LAD.  It was unclear whether there was significant obstruction.  At that time she had been having fatigue and shortness of breath.  Now she reports her shortness of breath is worsened and she developed chest pain.  This is described as sharp and lasted for several minutes across her back and shoulders, while exerting and exercising.  Apparently she was doing some lap pull downs suggesting this could be musculoskeletal, but she has had some other episodes with exertion which were concerning.  Overall she feels unwell and thinks that her symptoms are progressively worsening.  Her EKG however is normal today.  12/16/2018  Alexandria Wallace is seen today in follow-up.  She underwent left heart catheterization for persistent chest discomfort, fatigue and shortness of breath.  Her CT coronary angiogram suggested possible diminutive right coronary artery versus ostial obstruction.  Fortunately the cath showed a diminutive right coronary with a large left circulation and minimal, nonobstructive coronary disease with normal LV function.  She had significant dyslipidemia but has been on both ezetimibe and rosuvastatin 40 mg.  She recently had a repeat lipid profile on December 08, 2018.  Total cholesterol is 146, triglycerides 98, HDL 52 and LDL  74.  This represents a marked improvement and is very near goal LDL less than 70.  PMHx:  Past Medical History:  Diagnosis Date  . High cholesterol   . Mood disorder [facility])     Past Surgical History:  Procedure Laterality Date  . COLECTOMY    . EUS N/A 11/29/2015   Procedure: LOWER ENDOSCOPIC ULTRASOUND (EUS);  Surgeon: Arta Silence, MD;  Location: [facility] ENDOSCOPY;  Service: Endoscopy;  Laterality: N/A;  . FLEXIBLE SIGMOIDOSCOPY N/A 01/27/2018   Procedure: FLEXIBLE SIGMOIDOSCOPY;  Surgeon: Otis Brace, MD;  Location: [place];  Service: Gastroenterology;  Laterality: N/A;  . LEFT HEART CATH AND CORONARY ANGIOGRAPHY N/A 11/15/2018   Procedure: LEFT HEART CATH AND CORONARY ANGIOGRAPHY;  Surgeon: Troy Sine, MD;  Location: [facility] CV LAB;  Service: Cardiovascular;  Laterality: N/A;  . POLYPECTOMY  02/19/2016    FAMHx:  Family History  Problem Relation Age of Onset  . Heart attack Mother   . Cancer Father   . Cancer Sister 68  . Other Brother 10       meningitis     SOCHx:   reports that she has  never smoked. She has never used smokeless tobacco. She reports current alcohol use of about 2.0 standard drinks of alcohol per week. She reports that she does not use drugs.  ALLERGIES:  No Known Allergies  ROS: Pertinent items noted in HPI and remainder of comprehensive ROS otherwise negative.  HOME MEDS: Current Outpatient Medications on File Prior to Visit  Medication Sig Dispense Refill  . acetaminophen (TYLENOL) 500 MG tablet Take 500 mg by mouth every 6 (six) hours as needed (for pain.).    Marland Kitchen Ascorbic Acid (VITAMIN C) 1000 MG tablet Take 1,000 mg by mouth every evening.    Marland Kitchen aspirin EC 81 MG tablet Take 81 mg by mouth at bedtime.    . Cholecalciferol (VITAMIN D3) 75 MCG (3000 UT) TABS Take 3,000 Units by mouth daily.    . Coenzyme Q10 (CO Q10) 100 MG CAPS Take 100 mg by mouth every evening.     Marland Kitchen FLUoxetine (PROZAC) 10 MG capsule Take 10 mg by mouth daily.      Marland Kitchen ibuprofen (ADVIL,MOTRIN) 200 MG tablet Take 200 mg by mouth every 8 (eight) hours as needed (for pain.).    Marland Kitchen Methylfol-Algae-B12-Acetylcyst (CEREFOLIN NAC) 6-90.314-2-600 MG TABS Take 1 tablet by mouth 2 (two) times daily.    . metoprolol tartrate (LOPRESSOR) 50 MG tablet Take ONE tablet ONE HOUR prior to test. 1 tablet 0  . Multiple Vitamin (MULTIVITAMIN WITH MINERALS) TABS tablet Take 1 tablet by mouth every evening.    . Omega-3 Fatty Acids (FISH OIL) 1000 MG CAPS Take 1,000 mg by mouth at bedtime.    . rosuvastatin (CRESTOR) 40 MG tablet Take 1 tablet (40 mg total) by mouth daily. 90 tablet 3  . TURMERIC PO Take 448 mg by mouth every evening.    . vitamin B-12 (CYANOCOBALAMIN) 1000 MCG tablet Take 1,000 mcg by mouth daily.    Marland Kitchen ZETIA 10 MG tablet Take 10 mg by mouth every evening.      No current facility-administered medications on file prior to visit.     LABS/IMAGING: No results found for this or any previous visit (from the past 48 hour(s)). No results found.  WEIGHTS: Wt Readings from Last 3 Encounters:  12/16/18 166 lb 3.2 oz (75.4 kg)  11/15/18 159 lb (72.1 kg)  11/10/18 162 lb 6.4 oz (73.7 kg)    VITALS: BP 108/60   Pulse 80   Ht 5\' 4"  (1.626 m)   Wt 166 lb 3.2 oz (75.4 kg)   BMI 28.53 kg/m   EXAM: Deferred  EKG: Deferred  ASSESSMENT: 1. Unstable angina -mild nonobstructive coronary disease with a large left system and diminutive right coronary artery by cath (11/2018) 2. 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   Alexandria Wallace fortunately had minimal nonobstructive coronary disease with a large left coronary system and diminutive right.  This probably explains the abnormal findings on her cardiac CT.  From a cardiac standpoint it  does not seem that she has a cardiac cause of her chest discomfort.  Given her dyspnea on exertion and fatigue I would pursue a pulmonary work-up and she does have a pulmonary consultation next week.  She has responded to high intensity statin therapy with rosuvastatin and ezetimibe.  Her LDL is now 71 which is marked improvement since her previous LDL was  236.  We will continue this therapy.  Follow-up with me in 6 months.  Pixie Casino, MD, [facility], [place] Director of the [facility] Lipid [facility] &  Cardiovascular [facility] Diplomate of the American Board of Clinical Lipidology Attending Cardiologist  Direct Dial: [phone]  Fax: [phone]  Website:  [email or web address]  12/16/2018, 8:47 AM

## 2018-12-20 ENCOUNTER — Ambulatory Visit: Payer: Medicare Other | Admitting: Pulmonary Disease

## 2018-12-20 ENCOUNTER — Encounter: Payer: Self-pay | Admitting: Pulmonary Disease

## 2018-12-20 VITALS — BP 120/78 | HR 78 | Ht 64.0 in | Wt 164.4 lb

## 2018-12-20 DIAGNOSIS — R0602 Shortness of breath: Secondary | ICD-10-CM

## 2018-12-20 DIAGNOSIS — F419 Anxiety disorder, unspecified: Secondary | ICD-10-CM | POA: Diagnosis not present

## 2018-12-20 NOTE — Progress Notes (Signed)
Synopsis: Referred in January 2020 for shortness of breath by Leighton Ruff, MD  Subjective:   PATIENT ID: Alexandria Wallace GENDER: female DOB: 1941/11/13, MRN: [id number]  Chief Complaint  Patient presents with  . Consult    Cosult for SOB. States she has been SOB for last 10 months. She states she feels like her SOB is getting worse. Her chest feels tight and rattles sometimes. She states exercise makes it worse.     PMH HLD, c/o SOB and DOE, she recently had to stop her exercise program due to SOB. She feels like she can't get enough energy and gets weak. She has developed exercise intolerence. This has been going on for the past 10 months. She has been evaluated by cardiology as well as sleep evaluation, mild sleep apnea, no PAP tx. Life long non-smoker. Retired Education officer, museum. She enjoys going to the gym but feels like she cant recently. Likes to hike not doing that very much. Goes to the [facility] 4 times per week. 2 yoga and 2 body pump.  Patient does feel very anxious at times.  She does have a history of mood disorder and was on Zyprexa in the past.  She states ever since stopping the Zyprexa she complains of her tongue hurting and continuous ringing in her ears on a daily basis.  She is not really sure if the ringing in ears ears makes her anxious or if she feels anxious because she has ringing in her ears.  The reason that she has had to stop her physical activity at the [facility] is a feeling of heaviness in her lower extremities.  As if she is wearing heavy boots.   Past Medical History:  Diagnosis Date  . High cholesterol   . Mood disorder ([place])      Family History  Problem Relation Age of Onset  . Heart attack Mother   . Cancer Father   . Cancer Sister 73  . Other Brother 10       meningitis      Past Surgical History:  Procedure Laterality Date  . COLECTOMY    . EUS N/A 11/29/2015   Procedure: LOWER ENDOSCOPIC ULTRASOUND (EUS);  Surgeon: Arta Silence, MD;  Location:  [facility] ENDOSCOPY;  Service: Endoscopy;  Laterality: N/A;  . FLEXIBLE SIGMOIDOSCOPY N/A 01/27/2018   Procedure: FLEXIBLE SIGMOIDOSCOPY;  Surgeon: Otis Brace, MD;  Location: [place];  Service: Gastroenterology;  Laterality: N/A;  . LEFT HEART CATH AND CORONARY ANGIOGRAPHY N/A 11/15/2018   Procedure: LEFT HEART CATH AND CORONARY ANGIOGRAPHY;  Surgeon: Troy Sine, MD;  Location: [place] CV LAB;  Service: Cardiovascular;  Laterality: N/A;  . POLYPECTOMY  02/19/2016    Social History   Socioeconomic History  . Marital status: Legally Separated    Spouse name: Not on file  . Number of children: 2  . Years of education: master's  . Highest education level: Not on file  Occupational History  . Occupation: retired Tour manager  . Financial resource strain: Not on file  . Food insecurity:    Worry: Not on file    Inability: Not on file  . Transportation needs:    Medical: Not on file    Non-medical: Not on file  Tobacco Use  . Smoking status: Never Smoker  . Smokeless tobacco: Never Used  Substance and Sexual Activity  . Alcohol use: Yes    Alcohol/week: 2.0 standard drinks    Types: 2 Glasses of wine per week  .  Drug use: No  . Sexual activity: Not on file  Lifestyle  . Physical activity:    Days per week: Not on file    Minutes per session: Not on file  . Stress: Not on file  Relationships  . Social connections:    Talks on phone: Not on file    Gets together: Not on file    Attends religious service: Not on file    Active member of club or organization: Not on file    Attends meetings of clubs or organizations: Not on file    Relationship status: Not on file  . Intimate partner violence:    Fear of current or ex partner: Not on file    Emotionally abused: Not on file    Physically abused: Not on file    Forced sexual activity: Not on file  Other Topics Concern  . Not on file  Social History Narrative   Exercise - low impact aerobics, weight  class, walking 4-5x/week for 1 hour sessions   Epworth Sleepiness Scale = 6 (11/11/16)     No Known Allergies   Outpatient Medications Prior to Visit  Medication Sig Dispense Refill  . acetaminophen (TYLENOL) 500 MG tablet Take 500 mg by mouth every 6 (six) hours as needed (for pain.).    Marland Kitchen Ascorbic Acid (VITAMIN C) 1000 MG tablet Take 1,000 mg by mouth every evening.    Marland Kitchen aspirin EC 81 MG tablet Take 81 mg by mouth at bedtime.    . Cholecalciferol (VITAMIN D3) 75 MCG (3000 UT) TABS Take 3,000 Units by mouth daily.    . Coenzyme Q10 (CO Q10) 100 MG CAPS Take 100 mg by mouth every evening.     Marland Kitchen FLUoxetine (PROZAC) 10 MG capsule Take 10 mg by mouth daily.    Marland Kitchen ibuprofen (ADVIL,MOTRIN) 200 MG tablet Take 200 mg by mouth every 8 (eight) hours as needed (for pain.).    Marland Kitchen Methylfol-Algae-B12-Acetylcyst (CEREFOLIN NAC) 6-90.314-2-600 MG TABS Take 1 tablet by mouth 2 (two) times daily.    . Multiple Vitamin (MULTIVITAMIN WITH MINERALS) TABS tablet Take 1 tablet by mouth every evening.    . Omega-3 Fatty Acids (FISH OIL) 1000 MG CAPS Take 1,000 mg by mouth at bedtime.    . rosuvastatin (CRESTOR) 40 MG tablet Take 40 mg by mouth daily.    . TURMERIC PO Take 448 mg by mouth every evening.    . vitamin B-12 (CYANOCOBALAMIN) 1000 MCG tablet Take 1,000 mcg by mouth daily.    Marland Kitchen ZETIA 10 MG tablet Take 10 mg by mouth every evening.     . metoprolol tartrate (LOPRESSOR) 50 MG tablet Take ONE tablet ONE HOUR prior to test. (Patient not taking: Reported on 12/20/2018) 1 tablet 0  . rosuvastatin (CRESTOR) 40 MG tablet Take 1 tablet (40 mg total) by mouth daily. 90 tablet 3   No facility-administered medications prior to visit.     Review of Systems  Constitutional: Negative for chills, fever, malaise/fatigue and weight loss.  HENT: Negative for hearing loss, sore throat and tinnitus.   Eyes: Negative for blurred vision and double vision.  Respiratory: Negative for cough, hemoptysis, sputum production,  shortness of breath, wheezing and stridor.   Cardiovascular: Negative for chest pain, palpitations, orthopnea, leg swelling and PND.  Gastrointestinal: Negative for abdominal pain, constipation, diarrhea, heartburn, nausea and vomiting.  Genitourinary: Negative for dysuria, hematuria and urgency.  Musculoskeletal: Negative for joint pain and myalgias.  Skin: Negative for itching and rash.  Neurological: Positive for sensory change. Negative for dizziness, tingling, weakness and headaches.  Endo/Heme/Allergies: Negative for environmental allergies. Does not bruise/bleed easily.  Psychiatric/Behavioral: Negative for depression. The patient is nervous/anxious. The patient does not have insomnia.   All other systems reviewed and are negative.    Objective:  Physical Exam Vitals signs reviewed.  Constitutional:      General: She is not in acute distress.    Appearance: She is well-developed.  HENT:     Head: Normocephalic and atraumatic.  Eyes:     General: No scleral icterus.    Conjunctiva/sclera: Conjunctivae normal.     Pupils: Pupils are equal, round, and reactive to light.  Neck:     Musculoskeletal: Neck supple.     Vascular: No JVD.     Trachea: No tracheal deviation.  Cardiovascular:     Rate and Rhythm: Normal rate and regular rhythm.     Heart sounds: Normal heart sounds. No murmur.  Pulmonary:     Effort: Pulmonary effort is normal. No tachypnea, accessory muscle usage or respiratory distress.     Breath sounds: Normal breath sounds. No stridor. No wheezing, rhonchi or rales.  Abdominal:     General: Bowel sounds are normal. There is no distension.     Palpations: Abdomen is soft.     Tenderness: There is no abdominal tenderness.  Musculoskeletal:        General: No tenderness.  Lymphadenopathy:     Cervical: No cervical adenopathy.  Skin:    General: Skin is warm and dry.     Capillary Refill: Capillary refill takes less than 2 seconds.     Findings: No rash.   Neurological:     Mental Status: She is alert and oriented to person, place, and time.  Psychiatric:        Behavior: Behavior normal.      Vitals:   12/20/18 1526  BP: 120/78  Pulse: 78  SpO2: 100%  Weight: 164 lb 6.4 oz (74.6 kg)  Height: 5\' 4"  (1.626 m)   100% on RA BMI Readings from Last 3 Encounters:  12/20/18 28.22 kg/m  12/16/18 28.53 kg/m  11/15/18 27.29 kg/m   Wt Readings from Last 3 Encounters:  12/20/18 164 lb 6.4 oz (74.6 kg)  12/16/18 166 lb 3.2 oz (75.4 kg)  11/15/18 159 lb (72.1 kg)     CBC    Component Value Date/Time   WBC 5.8 11/10/2018 0952   WBC 15.7 (H) 02/25/2016 1545   RBC 3.92 11/10/2018 0952   RBC 2.89 (L) 02/25/2016 1545   HGB 12.3 11/10/2018 0952   HCT 36.8 11/10/2018 0952   PLT 265 11/10/2018 0952   MCV 94 11/10/2018 0952   MCH 31.4 11/10/2018 0952   MCH 31.8 02/25/2016 1545   MCHC 33.4 11/10/2018 0952   MCHC 32.3 02/25/2016 1545   RDW 12.6 11/10/2018 0952   LYMPHSABS 1.7 02/25/2016 1545   MONOABS 1.3 (H) 02/25/2016 1545   EOSABS 0.1 02/25/2016 1545   BASOSABS 0.0 02/25/2016 1545     Chest Imaging: No recent chest imaging  Pulmonary Functions Testing Results: No flowsheet data found.  FeNO: None   Pathology: None   Echocardiogram: 12/07/2016: Preserved ejection fraction 60 to 38%, grade 1 diastolic dysfunction.  Heart Catheterization:  11/15/2018:  The left ventricular systolic function is normal.  LV end diastolic pressure is normal.  The left ventricular ejection fraction is 55-65% by visual estimate.   There is evidence for very mild coronary calcification  without evidence for coronary obstructive disease.  The patient has a very short left main which immediately gives off a large LAD which wraps around the apex, a ramus intermediate vessel, and a very large dominant left circumflex coronary artery which gives off the PDA and PLA vessels and also extends to becomes the right coronary artery reaching almost the  sinus of Valsalva.  An RV marginal branch is seen arising from this circumflex extension vessel;  in addition there is a very proximal left atrial circumflex branch which also extends and supplies a small RCA like branch. Hyperdynamic LV function with suggestion of left ventricular hypertrophy.  EF estimate is 60 to 65%.  LVEDP is 14 mmHg.  RECOMMENDATION: Medical therapy.  Optimal blood pressure control with target blood pressure less than 130/80.  If patient cannot tolerate statin therapy or is unwilling to try, consider PCSK9 inhibition with familial hyperlipidemia.     Assessment & Plan:   SOB (shortness of breath) - Plan: Pulmonary function test  Anxiety  Discussion:  This is a 78 year old female with complaints of anxiety and shortness of breath.  She has no prior smoking history.  She had a significant cardiac work-up.  Her largest complaint today is around the lower extremities feeling heavy with significant exertion.  She also has ongoing persistent daily ringing in her ears that she is very concerned about.  Today we had a very long discussion about the work-up of her current symptoms.  We reviewed a coronary CT that was able to visualize a portion of the lung parenchyma however not all of it excluding the side of the right lung in the apex of the bilateral lungs.  However overall lung parenchyma looked normal. We also discussed the utility of obtaining pulmonary function test. I believe that pulmonary function test will at least give her some reassurance that her lung function is likely normal.  I suspect this will be the case. I encouraged her to go back to the [facility] and continue her regular exercise routine that she had become accustomed to. She also states that she will try to follow-up sooner with her counselor. I do believe that pulmonary function tests if they were abnormal would consider evaluating with chest imaging.  Patient to return to clinic in 1 to 2 weeks following  pulmonary function tests.  She can review these with 1 of our nurse practitioners.  If these are normal we can continue reassurance and encouragement back to her daily [employer] routine.  Greater than 50% of this patient's 45-minute office visit was spent face-to-face discussing the above recommendations and treatment plan.   Current Outpatient Medications:  .  acetaminophen (TYLENOL) 500 MG tablet, Take 500 mg by mouth every 6 (six) hours as needed (for pain.)., Disp: , Rfl:  .  Ascorbic Acid (VITAMIN C) 1000 MG tablet, Take 1,000 mg by mouth every evening., Disp: , Rfl:  .  aspirin EC 81 MG tablet, Take 81 mg by mouth at bedtime., Disp: , Rfl:  .  Cholecalciferol (VITAMIN D3) 75 MCG (3000 UT) TABS, Take 3,000 Units by mouth daily., Disp: , Rfl:  .  Coenzyme Q10 (CO Q10) 100 MG CAPS, Take 100 mg by mouth every evening. , Disp: , Rfl:  .  FLUoxetine (PROZAC) 10 MG capsule, Take 10 mg by mouth daily., Disp: , Rfl:  .  ibuprofen (ADVIL,MOTRIN) 200 MG tablet, Take 200 mg by mouth every 8 (eight) hours as needed (for pain.)., Disp: , Rfl:  .  Methylfol-Algae-B12-Acetylcyst (  CEREFOLIN NAC) 6-90.314-2-600 MG TABS, Take 1 tablet by mouth 2 (two) times daily., Disp: , Rfl:  .  Multiple Vitamin (MULTIVITAMIN WITH MINERALS) TABS tablet, Take 1 tablet by mouth every evening., Disp: , Rfl:  .  Omega-3 Fatty Acids (FISH OIL) 1000 MG CAPS, Take 1,000 mg by mouth at bedtime., Disp: , Rfl:  .  rosuvastatin (CRESTOR) 40 MG tablet, Take 40 mg by mouth daily., Disp: , Rfl:  .  TURMERIC PO, Take 448 mg by mouth every evening., Disp: , Rfl:  .  vitamin B-12 (CYANOCOBALAMIN) 1000 MCG tablet, Take 1,000 mcg by mouth daily., Disp: , Rfl:  .  ZETIA 10 MG tablet, Take 10 mg by mouth every evening. , Disp: , Rfl:    Garner Nash, DO [facility] 12/20/2018 3:53 PM

## 2018-12-20 NOTE — Patient Instructions (Addendum)
Thank you for visiting Dr. Valeta Harms at [facility]. Today we recommend the following: Orders Placed This Encounter  Procedures  . Pulmonary function test    Return in about 2 weeks (around 01/03/2019). Please schedule F/U with NP.

## 2019-01-04 ENCOUNTER — Ambulatory Visit: Payer: Medicare Other | Admitting: Internal Medicine

## 2019-01-11 ENCOUNTER — Ambulatory Visit: Payer: Medicare Other | Admitting: Nurse Practitioner

## 2019-01-11 ENCOUNTER — Ambulatory Visit (INDEPENDENT_AMBULATORY_CARE_PROVIDER_SITE_OTHER): Payer: Medicare Other | Admitting: Pulmonary Disease

## 2019-01-11 ENCOUNTER — Encounter: Payer: Self-pay | Admitting: Nurse Practitioner

## 2019-01-11 DIAGNOSIS — R0600 Dyspnea, unspecified: Secondary | ICD-10-CM | POA: Diagnosis not present

## 2019-01-11 DIAGNOSIS — R0602 Shortness of breath: Secondary | ICD-10-CM | POA: Diagnosis not present

## 2019-01-11 LAB — PULMONARY FUNCTION TEST
DL/VA % pred: 78 %
DL/VA: 3.22 ml/min/mmHg/L
DLCO cor % pred: 82 %
DLCO cor: 15.72 ml/min/mmHg
DLCO unc % pred: 79 %
DLCO unc: 15.17 ml/min/mmHg
FEF 25-75 Post: 4.22 L/sec
FEF 25-75 Pre: 3.43 L/sec
FEF2575-%Change-Post: 23 %
FEF2575-%Pred-Post: 254 %
FEF2575-%Pred-Pre: 206 %
FEV1-%Change-Post: 7 %
FEV1-%PRED-PRE: 134 %
FEV1-%Pred-Post: 144 %
FEV1-PRE: 2.84 L
FEV1-Post: 3.04 L
FEV1FVC-%Change-Post: 6 %
FEV1FVC-%Pred-Pre: 113 %
FEV6-%Change-Post: 0 %
FEV6-%Pred-Post: 126 %
FEV6-%Pred-Pre: 125 %
FEV6-POST: 3.37 L
FEV6-Pre: 3.35 L
FEV6FVC-%Change-Post: 0 %
FEV6FVC-%PRED-PRE: 105 %
FEV6FVC-%Pred-Post: 105 %
FVC-%Change-Post: 0 %
FVC-%Pred-Post: 120 %
FVC-%Pred-Pre: 119 %
FVC-Post: 3.38 L
FVC-Pre: 3.35 L
POST FEV6/FVC RATIO: 100 %
Post FEV1/FVC ratio: 90 %
Pre FEV1/FVC ratio: 85 %
Pre FEV6/FVC Ratio: 100 %
RV % pred: 83 %
RV: 1.9 L
TLC % pred: 107 %
TLC: 5.44 L

## 2019-01-11 NOTE — Progress Notes (Signed)
PFT done today. 

## 2019-01-11 NOTE — Patient Instructions (Signed)
PFT in office today was normal Continue current medications Follow up with PCP as scheduled Stay active Follow up with our office if shortness of breath returns

## 2019-01-11 NOTE — Progress Notes (Signed)
@Patient  ID: Alexandria Wallace, female    DOB: [birth year], 78 y.o.   MRN: [id number]  Chief Complaint  Patient presents with  . Follow-up    2 week follow up and PFT review    Referring provider: Cari Caraway, MD  HPI  78 year old female never smoker with shortness of breath followed by Dr. Valeta Harms.  Tests:  PFT: PFT Results Latest Ref Rng & Units 01/11/2019  FVC-Pre L 3.35  FVC-Predicted Pre % 119  FVC-Post L 3.38  FVC-Predicted Post % 120  Pre FEV1/FVC % % 85  Post FEV1/FCV % % 90  FEV1-Pre L 2.84  FEV1-Predicted Pre % 134  FEV1-Post L 3.04  DLCO UNC% % 79  DLCO COR %Predicted % 78  TLC L 5.44  TLC % Predicted % 107  RV % Predicted % 83    OV 01/12/19 - follow up with PFT Presents today for follow-up with PFT.  She was last seen by Dr. Valeta Harms on 12/20/2018.  Dr. Valeta Harms recommended that she have a PFT done after last visit.  Her PFT in office today was overall normal.  Discussed results with patient.  She states that her shortness of breath has improved since last visit.  She has returned to the gym and has been doing yoga.  She denies any recent shortness of breath.  Denies any recent fever.  Denies any chest pain or edema.  She is try to stay active.  She had complained of anxiety at last visit but states that this is better controlled now.      No Known Allergies  Immunization History  Administered Date(s) Administered  . Influenza Whole 11/19/2018    Past Medical History:  Diagnosis Date  . High cholesterol   . Mood disorder ([place])     Tobacco History: Social History   Tobacco Use  Smoking Status Never Smoker  Smokeless Tobacco Never Used   Counseling given: Yes   Outpatient Encounter Medications as of 01/11/2019  Medication Sig  . acetaminophen (TYLENOL) 500 MG tablet Take 500 mg by mouth every 6 (six) hours as needed (for pain.).  Marland Kitchen Ascorbic Acid (VITAMIN C) 1000 MG tablet Take 1,000 mg by mouth every evening.  Marland Kitchen aspirin EC 81 MG tablet Take 81  mg by mouth at bedtime.  . busPIRone (BUSPAR) 10 MG tablet Take 10 mg by mouth 2 (two) times daily.  . Cholecalciferol (VITAMIN D3) 75 MCG (3000 UT) TABS Take 3,000 Units by mouth daily.  . Coenzyme Q10 (CO Q10) 100 MG CAPS Take 100 mg by mouth every evening.   Marland Kitchen FLUoxetine (PROZAC) 10 MG capsule Take 10 mg by mouth daily.  Marland Kitchen ibuprofen (ADVIL,MOTRIN) 200 MG tablet Take 200 mg by mouth every 8 (eight) hours as needed (for pain.).  Marland Kitchen Methylfol-Algae-B12-Acetylcyst (CEREFOLIN NAC) 6-90.314-2-600 MG TABS Take 1 tablet by mouth 2 (two) times daily.  . Multiple Vitamin (MULTIVITAMIN WITH MINERALS) TABS tablet Take 1 tablet by mouth every evening.  . Omega-3 Fatty Acids (FISH OIL) 1000 MG CAPS Take 1,000 mg by mouth at bedtime.  . rosuvastatin (CRESTOR) 40 MG tablet Take 40 mg by mouth daily.  . TURMERIC PO Take 448 mg by mouth every evening.  . vitamin B-12 (CYANOCOBALAMIN) 1000 MCG tablet Take 1,000 mcg by mouth daily.  Marland Kitchen ZETIA 10 MG tablet Take 10 mg by mouth every evening.    No facility-administered encounter medications on file as of 01/11/2019.      Review of Systems  Review of Systems  Constitutional: Negative.  Negative for chills and fever.  HENT: Negative.   Respiratory: Negative for cough, shortness of breath and wheezing.   Cardiovascular: Negative.  Negative for chest pain, palpitations and leg swelling.  Gastrointestinal: Negative.   Allergic/Immunologic: Negative.   Neurological: Negative.   Psychiatric/Behavioral: Negative.        Physical Exam  BP 102/64 (BP Location: Left Arm, Cuff Size: Normal)   Pulse 85   Ht 5\' 4"  (1.626 m)   Wt 168 lb (76.2 kg)   SpO2 97%   BMI 28.84 kg/m   Wt Readings from Last 5 Encounters:  01/11/19 168 lb (76.2 kg)  12/20/18 164 lb 6.4 oz (74.6 kg)  12/16/18 166 lb 3.2 oz (75.4 kg)  11/15/18 159 lb (72.1 kg)  11/10/18 162 lb 6.4 oz (73.7 kg)     Physical Exam Vitals signs and nursing note reviewed.  Constitutional:       General: She is not in acute distress.    Appearance: She is well-developed.  Cardiovascular:     Rate and Rhythm: Normal rate and regular rhythm.  Pulmonary:     Effort: Pulmonary effort is normal. No respiratory distress.     Breath sounds: Normal breath sounds. No wheezing or rhonchi.  Musculoskeletal:        General: No swelling.  Neurological:     Mental Status: She is alert and oriented to person, place, and time.       Assessment & Plan:   Dyspnea This is a 78 year old female with complaints of anxiety and shortness of breath.  She has no prior smoking history.  She has had a significant cardiac work-up.   I reviewed the results from the PFT.  Assured patient that PFT was normal.  He was glad to hear this. I encouraged her to continue going to the [facility] and continue her regular exercise routine   Patient Instructions  PFT in office today was normal Continue current medications Follow up with PCP as scheduled Stay active Follow up with our office if shortness of breath returns       Fenton Foy, NP 01/12/2019

## 2019-01-12 ENCOUNTER — Encounter: Payer: Self-pay | Admitting: Nurse Practitioner

## 2019-01-12 NOTE — Assessment & Plan Note (Signed)
This is a 78 year old female with complaints of anxiety and shortness of breath.  She has no prior smoking history.  She has had a significant cardiac work-up.   I reviewed the results from the PFT.  Assured patient that PFT was normal.  He was glad to hear this. I encouraged her to continue going to the [facility] and continue her regular exercise routine   Patient Instructions  PFT in office today was normal Continue current medications Follow up with PCP as scheduled Stay active Follow up with our office if shortness of breath returns

## 2019-01-12 NOTE — Progress Notes (Signed)
PCCM: Agree. Thanks for seeing her.  [facility] 01/12/2019 12:11 PM

## 2019-02-09 ENCOUNTER — Other Ambulatory Visit: Payer: Medicare Other

## 2019-03-07 ENCOUNTER — Other Ambulatory Visit: Payer: Medicare Other

## 2019-03-13 ENCOUNTER — Other Ambulatory Visit: Payer: Medicare Other

## 2019-07-27 ENCOUNTER — Telehealth: Payer: Self-pay | Admitting: *Deleted

## 2019-07-27 NOTE — Telephone Encounter (Signed)
A message was left, re: follow up visit. 

## 2019-09-19 ENCOUNTER — Other Ambulatory Visit: Payer: Self-pay

## 2019-09-19 DIAGNOSIS — Z20822 Contact with and (suspected) exposure to covid-19: Secondary | ICD-10-CM

## 2019-09-21 LAB — NOVEL CORONAVIRUS, NAA: SARS-CoV-2, NAA: NOT DETECTED

## 2019-10-10 ENCOUNTER — Other Ambulatory Visit: Payer: Medicare Other

## 2019-11-02 ENCOUNTER — Other Ambulatory Visit: Payer: Self-pay | Admitting: Family Medicine

## 2019-11-02 DIAGNOSIS — M791 Myalgia, unspecified site: Secondary | ICD-10-CM

## 2019-11-03 ENCOUNTER — Other Ambulatory Visit: Payer: Medicare Other

## 2019-11-07 ENCOUNTER — Ambulatory Visit: Payer: Medicare Other | Attending: Family Medicine | Admitting: Physical Therapy

## 2019-11-07 ENCOUNTER — Other Ambulatory Visit: Payer: Self-pay

## 2019-11-07 DIAGNOSIS — M62838 Other muscle spasm: Secondary | ICD-10-CM | POA: Diagnosis present

## 2019-11-07 DIAGNOSIS — M6281 Muscle weakness (generalized): Secondary | ICD-10-CM

## 2019-11-07 DIAGNOSIS — R293 Abnormal posture: Secondary | ICD-10-CM | POA: Diagnosis present

## 2019-11-07 NOTE — Therapy (Signed)
[facility] [address], [place], [place], [address] Phone: [phone]   Fax:  [phone]  Physical Therapy Evaluation  Patient Details  Name: Alexandria Wallace MRN: [id number] Date of Birth: [birth year] Referring Provider (PT): Cari Caraway, MD   Encounter Date: 11/07/2019  PT End of Session - 11/07/19 1012    Visit Number  1    Date for PT Re-Evaluation  12/19/19    PT Start Time  0920    PT Stop Time  1001    PT Time Calculation (min)  41 min    Activity Tolerance  Patient tolerated treatment well    Behavior During Therapy  [facility] for tasks assessed/performed       Past Medical History:  Diagnosis Date  . High cholesterol   . Mood disorder [facility])     Past Surgical History:  Procedure Laterality Date  . COLECTOMY    . EUS N/A 11/29/2015   Procedure: LOWER ENDOSCOPIC ULTRASOUND (EUS);  Surgeon: Arta Silence, MD;  Location: [facility] ENDOSCOPY;  Service: Endoscopy;  Laterality: N/A;  . FLEXIBLE SIGMOIDOSCOPY N/A 01/27/2018   Procedure: FLEXIBLE SIGMOIDOSCOPY;  Surgeon: Otis Brace, MD;  Location: [place];  Service: Gastroenterology;  Laterality: N/A;  . LEFT HEART CATH AND CORONARY ANGIOGRAPHY N/A 11/15/2018   Procedure: LEFT HEART CATH AND CORONARY ANGIOGRAPHY;  Surgeon: Troy Sine, MD;  Location: [place] CV [facility];  Service: Cardiovascular;  Laterality: N/A;  . POLYPECTOMY  02/19/2016    There were no vitals filed for this visit.   Subjective Assessment - 11/07/19 0920    Subjective  The pain started in the lower leg in January.  The worst pain is sleeping and first thing in the morning.    Limitations  Sitting    Patient Stated Goals  get rid of the pain, get some exercises    Currently in Pain?  Yes    Pain Score  5     Pain Location  Back    Pain Orientation  Right    Pain Descriptors / Indicators  Burning    Pain Type  Chronic pain    Pain Radiating Towards  Rt buttock and Rt  anterior shin    Pain Onset  More than a month ago    Pain Frequency  Constant    Aggravating Factors   sleeping and sitting    Pain Relieving Factors  moving and heat, Tylenol    Multiple Pain Sites  Yes    Pain Score  3    Pain Location  Back    Pain Orientation  Upper;Mid    Pain Descriptors / Indicators  Burning    Pain Type  Chronic pain    Pain Frequency  Intermittent    Aggravating Factors   working on the computer or in the kitchen    Pain Relieving Factors  lying on my back on the floor         [facility] PT Assessment - 11/07/19 0001      Assessment   Medical Diagnosis  M54.31 (ICD-10-CM) - Sciatica, right side    Referring Provider (PT)  Cari Caraway, MD    Onset Date/Surgical Date  --   Jan 2020   Prior Therapy  No   chiropractic and acupuncture     Precautions   Precautions  None      Restrictions   Weight Bearing Restrictions  No      Balance Screen   Has the  patient fallen in the past 6 months  No      [place]   temporarily her 2 adult children stay with her     Prior Function   Level of Independence  Independent      Cognition   Overall Cognitive Status  Within Functional Limits for tasks assessed      Observation/Other Assessments   Focus on Therapeutic Outcomes (FOTO)   31% limited      Posture/Postural Control   Posture/Postural Control  Postural limitations    Postural Limitations  Increased thoracic kyphosis;Anterior pelvic tilt;Rounded Shoulders      ROM / Strength   AROM / PROM / Strength  AROM;PROM;Strength      AROM   Overall AROM Comments  lumbar WFL      PROM   Overall PROM Comments  R 20% limited rotation and flexion      Strength   Overall Strength Comments  Rt abduction 4-/5; Lt abduction      Flexibility   Soft Tissue Assessment /Muscle Length  yes    Hamstrings  Rt hamstring 20% limited      Palpation   SI assessment   normal     Palpation comment  Rt gluteals tight and TTP at sacral attachment      Special Tests    Special Tests  Lumbar    Lumbar Tests  Slump Test      Slump test   Findings  Negative      Ambulation/Gait   Gait Pattern  Trunk flexed                Objective measurements completed on examination: See above findings.      [place] Adult PT Treatment/Exercise - 11/07/19 0001      Self-Care   Self-Care  Other Self-Care Comments    Other Self-Care Comments   educated and performed Access Code: [email or web address]  for initial HEP             PT Education - 11/07/19 1036    Education Details  Access Code: [email or web address]    Person(s) Educated  Patient    Methods  Explanation;Demonstration;Handout;Verbal cues    Comprehension  Verbalized understanding;Returned demonstration          PT Long Term Goals - 11/07/19 1013      PT LONG TERM GOAL #1   Title  Pt will be ind with exercises to maintain improved posture    Time  6    Period  Weeks    Status  New    Target Date  12/19/19      PT LONG TERM GOAL #2   Title  Pt will report 50% less pain when waking up in the morning    Time  6    Period  Weeks    Status  New    Target Date  12/19/19      PT LONG TERM GOAL #3   Title  Pt will report she can do work on the computer or in the kitchen for at least one hour without increased pain    Time  6    Period  Weeks    Status  New    Target Date  12/19/19             Plan - 11/07/19 1020    Clinical Impression Statement  Pt presents to clinic today due  to back pain that radiates into her Rt leg and has been on for almost a year. Pt has anterior pelvic tilt and posture abnormalities as mentioned above.  She has LE and core weakness.  Pt has gait abnormailities with flexed posture. Pt has decreased ROM of Rt hip and muscle spasms as noted.  Pt will benefit from skilled PT to get patient on a HEP so she can improve her posture and strength for less dicomfort during functional  activities.    Stability/Clinical Decision Making  Stable/Uncomplicated    Clinical Decision Making  Low    Rehab Potential  Excellent    PT Frequency  1x / week    PT Duration  6 weeks    PT Treatment/Interventions  ADLs/Self Care Home Management;Biofeedback;Cryotherapy;Electrical Stimulation;Moist Heat;Traction;Therapeutic activities;Therapeutic exercise;Neuromuscular re-education;Patient/family education;Manual techniques;Passive range of motion;Taping    PT Next Visit Plan  core and hip strength, hip, lumbar and thoracic ROM - establish a comprehensive HEP for improved strength during functional activities; pt wants to get exercises that will help her feel better and then work on her own after 2-3 visits    PT Home Exercise Plan  Access Code: [email or web address]    Consulted and Agree with Plan of Care  Patient       Patient will benefit from skilled therapeutic intervention in order to improve the following deficits and impairments:  Abnormal gait, Pain, Increased fascial restricitons, Decreased strength, Decreased range of motion, Increased muscle spasms, Postural dysfunction  Visit Diagnosis: Muscle weakness (generalized)  Other muscle spasm  Abnormal posture     Problem List Patient Active Problem List   Diagnosis Date Noted  . Dyspnea   . 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 Murmur 11/11/2016  . Mixed hyperlipidemia 11/11/2016    Jule Ser, PT 11/07/2019, 10:40 AM  [facility] [address], [place], [place], [address] Phone: [phone]   Fax:  [phone]  Name: Alexandria Wallace MRN: [id number] Date of Birth: January 05, 1941

## 2019-11-07 NOTE — Patient Instructions (Signed)
Access Code: [email or web address]  URL: [email or web address]  Date: 11/07/2019  Prepared by: Jari Favre   Exercises  Hooklying Small March - 10 reps - 2 sets - 1x daily - 7x weekly  Supine Hamstring Stretch - 3 reps - 1 sets - 30 sec hold - 1x daily - 7x weekly  Supine Figure 4 Piriformis Stretch - 3 reps - 1 sets - 30 sec hold - 1x daily - 7x weekly  Supine Hip Internal and External Rotation - 10 reps - 1 sets - 5 sec hold - 1x daily - 7x weekly

## 2019-11-07 NOTE — Addendum Note (Signed)
Addended by: Su Hoff on: 11/07/2019 10:45 AM   Modules accepted: Orders

## 2019-11-10 ENCOUNTER — Ambulatory Visit
Admission: RE | Admit: 2019-11-10 | Discharge: 2019-11-10 | Disposition: A | Discharge status: Home / Self Care | Payer: Medicare Other | Source: Ambulatory Visit | Attending: Family Medicine | Admitting: Family Medicine

## 2019-11-10 DIAGNOSIS — M791 Myalgia, unspecified site: Secondary | ICD-10-CM

## 2019-11-17 ENCOUNTER — Other Ambulatory Visit: Payer: Self-pay

## 2019-11-17 ENCOUNTER — Ambulatory Visit: Payer: Medicare Other | Admitting: Physical Therapy

## 2019-11-17 DIAGNOSIS — M6281 Muscle weakness (generalized): Secondary | ICD-10-CM | POA: Diagnosis not present

## 2019-11-17 DIAGNOSIS — R293 Abnormal posture: Secondary | ICD-10-CM

## 2019-11-17 DIAGNOSIS — M62838 Other muscle spasm: Secondary | ICD-10-CM

## 2019-11-17 NOTE — Therapy (Addendum)
[facility] [address], [place], [place], [address] Phone: [phone]   Fax:  [phone]  Physical Therapy Treatment  Patient Details  Name: Alexandria Wallace MRN: [id number] Date of Birth: Jan 07, 1941 Referring Provider (PT): Cari Caraway, MD   Encounter Date: 11/17/2019  PT End of Session - 11/17/19 1110    Visit Number  2    Date for PT Re-Evaluation  12/19/19    PT Start Time  1102    PT Stop Time  1142    PT Time Calculation (min)  40 min    Activity Tolerance  Patient tolerated treatment well    Behavior During Therapy  [facility] for tasks assessed/performed       Past Medical History:  Diagnosis Date  . High cholesterol   . Mood disorder [facility])     Past Surgical History:  Procedure Laterality Date  . COLECTOMY    . EUS N/A 11/29/2015   Procedure: LOWER ENDOSCOPIC ULTRASOUND (EUS);  Surgeon: Arta Silence, MD;  Location: [facility] ENDOSCOPY;  Service: Endoscopy;  Laterality: N/A;  . FLEXIBLE SIGMOIDOSCOPY N/A 01/27/2018   Procedure: FLEXIBLE SIGMOIDOSCOPY;  Surgeon: Otis Brace, MD;  Location: [place];  Service: Gastroenterology;  Laterality: N/A;  . LEFT HEART CATH AND CORONARY ANGIOGRAPHY N/A 11/15/2018   Procedure: LEFT HEART CATH AND CORONARY ANGIOGRAPHY;  Surgeon: Troy Sine, MD;  Location: [place] [facility];  Service: Cardiovascular;  Laterality: N/A;  . POLYPECTOMY  02/19/2016    There were no vitals filed for this visit.  Subjective Assessment - 11/17/19 1106    Subjective  This is a good morning.    Patient Stated Goals  get rid of the pain, get some exercises    Currently in Pain?  Yes    Pain Score  1     Pain Location  Back    Pain Orientation  Right    Pain Descriptors / Indicators  Aching    Pain Type  Chronic pain    Multiple Pain Sites  No                       [facility] Adult PT Treatment/Exercise - 11/20/19 0001      Exercises   Exercises  Lumbar       Lumbar Exercises: Standing   Other Standing Lumbar Exercises  Exercises: Hooklying Small March - 10 reps - 2 sets - 1x daily - 7x weeklySupine Hamstring Stretch -  reps - 1 sets - 30 sec hold - 1x daily - 7x weekly; Supine Figure 4 Piriformis Stretch - 3 reps - 1 sets - 30 sec hold - 1x daily - 7x weekly; Supine Hip Internal and External Rotation - 10 reps - 1 sets - 5 sec hold - 1x daily - 7x weekly; Supine Double Knee to Chest - 10 reps - 3 sets - 1x daily - 7x weekly; Supine Single Knee to Chest Stretch - 5 reps - 1 sets - 10 sec hold - 2x daily - 7x weekly; Supine Posterior Pelvic Tilt - 10 reps - 3 sets - 1x daily - 7x weekly Supine Posterior Pelvic Tilt with Knee Rocks - 10 reps - 3 sets - 1x daily - 7x weekly; Overhead Reach on Foam Roll - 10 reps - 2 sets - 1x daily - 7x weekly; Anti-Rotation Sidestepping with Resistance - 10 reps - 3 sets - 1x daily - 7x weekly; Shoulder extension with resistance - Neutral - 10  reps - 3 sets - 1x daily - 7x weekly             PT Education - 11/20/19 0745    Education Details  Access Code: [email or web address]    Person(s) Educated  Patient    Methods  Explanation;Demonstration;Verbal cues;Handout    Comprehension  Verbalized understanding;Returned demonstration          PT Long Term Goals - 11/07/19 1013      PT LONG TERM GOAL #1   Title  Pt will be ind with exercises to maintain improved posture    Time  6    Period  Weeks    Status  New    Target Date  12/19/19      PT LONG TERM GOAL #2   Title  Pt will report 50% less pain when waking up in the morning    Time  6    Period  Weeks    Status  New    Target Date  12/19/19      PT LONG TERM GOAL #3   Title  Pt will report she can do work on the computer or in the kitchen for at least one hour without increased pain    Time  6    Period  Weeks    Status  New    Target Date  12/19/19            Plan - 11/20/19 0746    Clinical Impression Statement  Pt reports feeling better after  exercises during treatment today.  She came to PT today feeling good but was concerned she might stir something up.  Pt was encouraged when the exercises felt good and she could feel the correct muscles engaged.  Pt performed all exercises as seen listed in HEP and updated accordingly    PT Treatment/Interventions  ADLs/Self Care Home Management;Biofeedback;Cryotherapy;Electrical Stimulation;Moist Heat;Traction;Therapeutic activities;Therapeutic exercise;Neuromuscular re-education;Patient/family education;Manual techniques;Passive range of motion;Taping    PT Next Visit Plan  porgress HEP and core strength, lumbar ROM    PT Home Exercise Plan  Access Code: [email or web address]    Consulted and Agree with Plan of Care  Patient       Patient will benefit from skilled therapeutic intervention in order to improve the following deficits and impairments:  Abnormal gait, Pain, Increased fascial restricitons, Decreased strength, Decreased range of motion, Increased muscle spasms, Postural dysfunction  Visit Diagnosis: Muscle weakness (generalized)  Other muscle spasm  Abnormal posture     Problem List Patient Active Problem List   Diagnosis Date Noted  . Dyspnea   . 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 Murmur 11/11/2016  . Mixed hyperlipidemia 11/11/2016    Jule Ser, PT 11/20/2019, 7:57 AM  [facility] [address] [place], [place], [address] Phone: [phone]   Fax:  [phone]  Name: Alexandria Wallace MRN: [id number] Date of Birth: October 19, 1941

## 2019-11-20 NOTE — Patient Instructions (Signed)
Access Code: [email or web address]  URL: [email or web address]  Date: 11/20/2019  Prepared by: Jari Favre   Exercises Hooklying Small March - 10 reps - 2 sets - 1x daily - 7x weekly Supine Hamstring Stretch - 3 reps - 1 sets - 30 sec hold - 1x daily - 7x weekly Supine Figure 4 Piriformis Stretch - 3 reps - 1 sets - 30 sec hold - 1x daily - 7x weekly Supine Hip Internal and External Rotation - 10 reps - 1 sets - 5 sec hold - 1x daily - 7x weekly Supine Double Knee to Chest - 10 reps - 3 sets - 1x daily - 7x weekly Supine Single Knee to Chest Stretch - 5 reps - 1 sets - 10 sec hold                            - 2x daily - 7x weekly Supine Posterior Pelvic Tilt - 10 reps - 3 sets - 1x daily - 7x weekly                  Supine Posterior Pelvic Tilt with Knee Rocks - 10 reps - 3 sets - 1x daily - 7x weekly Overhead Reach on Foam Roll - 10 reps - 2 sets - 1x daily - 7x weekly Anti-Rotation Sidestepping with Resistance - 10 reps - 3 sets - 1x daily - 7x weekly Shoulder extension with resistance - Neutral - 10 reps - 3 sets - 1x daily - 7x weekly

## 2019-11-28 ENCOUNTER — Encounter: Payer: Medicare Other | Admitting: Physical Therapy

## 2019-12-05 ENCOUNTER — Other Ambulatory Visit: Payer: Self-pay

## 2019-12-05 ENCOUNTER — Ambulatory Visit: Payer: Medicare PPO | Attending: Family Medicine | Admitting: Physical Therapy

## 2019-12-05 ENCOUNTER — Encounter: Payer: Self-pay | Admitting: Physical Therapy

## 2019-12-05 DIAGNOSIS — M6281 Muscle weakness (generalized): Secondary | ICD-10-CM | POA: Insufficient documentation

## 2019-12-05 DIAGNOSIS — M62838 Other muscle spasm: Secondary | ICD-10-CM | POA: Diagnosis present

## 2019-12-05 DIAGNOSIS — R293 Abnormal posture: Secondary | ICD-10-CM | POA: Diagnosis present

## 2019-12-05 NOTE — Therapy (Signed)
[facility] [address] [place], [place], [address] Phone: [phone]   Fax:  [phone]  Physical Therapy Treatment  Patient Details  Name: VINCENT EHRLER MRN: [id number] Date of Birth: 02-Apr-1941 Referring Provider (PT): Cari Caraway, MD   Encounter Date: 12/05/2019  PT End of Session - 12/05/19 1142    Visit Number  3    Date for PT Re-Evaluation  12/19/19    PT Start Time  1142    PT Stop Time  1223    PT Time Calculation (min)  41 min    Activity Tolerance  Patient tolerated treatment well    Behavior During Therapy  [facility] for tasks assessed/performed       Past Medical History:  Diagnosis Date  . High cholesterol   . Mood disorder [facility])     Past Surgical History:  Procedure Laterality Date  . COLECTOMY    . EUS N/A 11/29/2015   Procedure: LOWER ENDOSCOPIC ULTRASOUND (EUS);  Surgeon: Arta Silence, MD;  Location: [facility] ENDOSCOPY;  Service: Endoscopy;  Laterality: N/A;  . FLEXIBLE SIGMOIDOSCOPY N/A 01/27/2018   Procedure: FLEXIBLE SIGMOIDOSCOPY;  Surgeon: Otis Brace, MD;  Location: [place];  Service: Gastroenterology;  Laterality: N/A;  . LEFT HEART CATH AND CORONARY ANGIOGRAPHY N/A 11/15/2018   Procedure: LEFT HEART CATH AND CORONARY ANGIOGRAPHY;  Surgeon: Troy Sine, MD;  Location: [place] [facility];  Service: Cardiovascular;  Laterality: N/A;  . POLYPECTOMY  02/19/2016    There were no vitals filed for this visit.  Subjective Assessment - 12/05/19 1142    Subjective  Mostly I have not had any pain . I don't know why it started hurting all of a sudden in the car    Currently in Pain?  Yes    Pain Score  4     Pain Location  Back    Pain Orientation  Right    Pain Descriptors / Indicators  Aching    Pain Type  Chronic pain    Pain Radiating Towards  Rt buttock and upper thigh    Pain Onset  More than a month ago    Pain Frequency  Constant    Aggravating Factors   it just  started bothering me when I got out of the car, other than that it was fine    Multiple Pain Sites  No                       [facility] Adult PT Treatment/Exercise - 12/05/19 0001      Lumbar Exercises: Stretches   Active Hamstring Stretch  Right;Left;1 rep;30 seconds    Lower Trunk Rotation Limitations  rolling ball side to side with pball seated    Quadruped Mid Back Stretch Limitations  corner stretch for pec and increased thoracic extension 20 sec    Figure 4 Stretch  2 reps;20 seconds;With overpressure;Seated    Other Lumbar Stretch Exercise  flexion with pball roll      Lumbar Exercises: Standing   Functional Squats  15 reps    Functional Squats Limitations  cues for correct alignment    Row  Strengthening;Both;20 reps;Theraband    Theraband Level (Row)  Level 3 (Green)    Shoulder Extension  Strengthening;Both;20 reps;Theraband    Theraband Level (Shoulder Extension)  Level 3 (Green)    Other Standing Lumbar Exercises  hip abduction and knee flexion - 10x each side      Lumbar Exercises:  Seated   Long [employer] on Chair  Strengthening;Right;Left;20 reps   1 set of 10 with 2lb- TrA activated                 PT Long Term Goals - 12/05/19 1151      PT LONG TERM GOAL #1   Title  Pt will be ind with exercises to maintain improved posture    Baseline  feeling good with current HEP and is independent    Status  Achieved      PT LONG TERM GOAL #2   Title  Pt will report 50% less pain when waking up in the morning    Baseline  more than 50%    Status  Achieved      PT LONG TERM GOAL #3   Title  Pt will report she can do work on the computer or in the kitchen for at least one hour without increased pain    Baseline  can do it for as long as I want to    Status  Achieved            Plan - 12/05/19 1229    Clinical Impression Statement  Pt has met goals and ind with HEP as shown.  Pt will benefit from discharge home with HEP    PT  Treatment/Interventions  ADLs/Self Care Home Management;Biofeedback;Cryotherapy;Electrical Stimulation;Moist Heat;Traction;Therapeutic activities;Therapeutic exercise;Neuromuscular re-education;Patient/family education;Manual techniques;Passive range of motion;Taping    PT Next Visit Plan  d/c today    PT Home Exercise Plan  Access Code: [email or web address]    Consulted and Agree with Plan of Care  Patient       Patient will benefit from skilled therapeutic intervention in order to improve the following deficits and impairments:  Abnormal gait, Pain, Increased fascial restricitons, Decreased strength, Decreased range of motion, Increased muscle spasms, Postural dysfunction  Visit Diagnosis: Muscle weakness (generalized)  Other muscle spasm  Abnormal posture     Problem List Patient Active Problem List   Diagnosis Date Noted  . Dyspnea   . 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 Murmur 11/11/2016  . Mixed hyperlipidemia 11/11/2016    Jule Ser, PT 12/05/2019, 12:31 PM  [facility] [address] [place], [place], [address] Phone: [phone]   Fax:  [phone]  Name: PAITYNN MIKUS MRN: [id number] Date of Birth: May 27, 1941  PHYSICAL THERAPY DISCHARGE SUMMARY  Visits from Start of Care: 3  Current functional level related to goals / functional outcomes: See above   Remaining deficits: See above   Education / Equipment: HEP  Plan: Patient agrees to discharge.  Patient goals were met. Patient is being discharged due to meeting the stated rehab goals.  ?????    [employer], PT 12/05/19 12:32 PM

## 2019-12-05 NOTE — Patient Instructions (Signed)
Access code [email or web address]

## 2020-01-10 ENCOUNTER — Other Ambulatory Visit: Payer: Self-pay | Admitting: Family Medicine

## 2020-01-10 DIAGNOSIS — Z1231 Encounter for screening mammogram for malignant neoplasm of breast: Secondary | ICD-10-CM

## 2020-01-10 DIAGNOSIS — M8580 Other specified disorders of bone density and structure, unspecified site: Secondary | ICD-10-CM

## 2020-01-13 ENCOUNTER — Ambulatory Visit: Payer: Medicare PPO

## 2020-01-14 ENCOUNTER — Ambulatory Visit: Payer: Medicare PPO | Attending: Internal Medicine

## 2020-01-14 DIAGNOSIS — Z23 Encounter for immunization: Secondary | ICD-10-CM | POA: Insufficient documentation

## 2020-01-14 NOTE — Progress Notes (Signed)
   Covid-19 Vaccination Clinic  Name:  Alexandria Wallace    MRN: [id number] DOB: [birth year]  01/14/2020  [name] was observed post Covid-19 immunization for 15 minutes without incidence. She was provided with Vaccine Information Sheet and instruction to access the V-Safe system.   [name] was instructed to call 911 with any severe reactions post vaccine: Marland Kitchen Difficulty breathing  . Swelling of your face and throat  . A fast heartbeat  . A bad rash all over your body  . Dizziness and weakness    Immunizations Administered    Name Date Dose VIS Date Route   Pfizer COVID-19 Vaccine 01/14/2020 10:40 AM 0.3 mL 11/10/2019 Intramuscular   Manufacturer: [place]   Lot: [id number]   [place]: [id number]

## 2020-01-31 ENCOUNTER — Other Ambulatory Visit: Payer: Medicare Other

## 2020-02-06 ENCOUNTER — Ambulatory Visit: Payer: Medicare PPO | Attending: Internal Medicine

## 2020-02-06 DIAGNOSIS — Z23 Encounter for immunization: Secondary | ICD-10-CM | POA: Insufficient documentation

## 2020-02-06 NOTE — Progress Notes (Signed)
   Covid-19 Vaccination Clinic  Name:  GUISELLE DALPORTO    MRN: [id number] DOB: 1941-06-28  02/06/2020  Ms. Dumont was observed post Covid-19 immunization for 15 minutes without incident. She was provided with Vaccine Information Sheet and instruction to access the V-Safe system.   Ms. Verhoff was instructed to call 911 with any severe reactions post vaccine: Marland Kitchen Difficulty breathing  . Swelling of face and throat  . A fast heartbeat  . A bad rash all over body  . Dizziness and weakness   Immunizations Administered    Name Date Dose VIS Date Route   Pfizer COVID-19 Vaccine 02/06/2020 12:41 PM 0.3 mL 11/10/2019 Intramuscular   Manufacturer: [place]   Lot: [id number]   [place]: [id number]

## 2020-03-21 ENCOUNTER — Ambulatory Visit: Payer: Medicare PPO | Admitting: Internal Medicine

## 2020-03-28 ENCOUNTER — Other Ambulatory Visit: Payer: Self-pay

## 2020-03-28 ENCOUNTER — Ambulatory Visit
Admission: RE | Admit: 2020-03-28 | Discharge: 2020-03-28 | Disposition: A | Discharge status: Home / Self Care | Payer: Medicare PPO | Source: Ambulatory Visit | Attending: Family Medicine | Admitting: Family Medicine

## 2020-03-28 DIAGNOSIS — Z1231 Encounter for screening mammogram for malignant neoplasm of breast: Secondary | ICD-10-CM

## 2020-03-28 DIAGNOSIS — Z780 Asymptomatic menopausal state: Secondary | ICD-10-CM | POA: Diagnosis not present

## 2020-03-28 DIAGNOSIS — M8588 Other specified disorders of bone density and structure, other site: Secondary | ICD-10-CM | POA: Diagnosis not present

## 2020-03-28 DIAGNOSIS — M810 Age-related osteoporosis without current pathological fracture: Secondary | ICD-10-CM | POA: Diagnosis not present

## 2020-03-28 DIAGNOSIS — M8580 Other specified disorders of bone density and structure, unspecified site: Secondary | ICD-10-CM

## 2020-04-03 DIAGNOSIS — H25013 Cortical age-related cataract, bilateral: Secondary | ICD-10-CM | POA: Diagnosis not present

## 2020-04-03 DIAGNOSIS — H40013 Open angle with borderline findings, low risk, bilateral: Secondary | ICD-10-CM | POA: Diagnosis not present

## 2020-04-03 DIAGNOSIS — H25043 Posterior subcapsular polar age-related cataract, bilateral: Secondary | ICD-10-CM | POA: Diagnosis not present

## 2020-04-03 DIAGNOSIS — H2513 Age-related nuclear cataract, bilateral: Secondary | ICD-10-CM | POA: Diagnosis not present

## 2020-04-09 DIAGNOSIS — M85852 Other specified disorders of bone density and structure, left thigh: Secondary | ICD-10-CM | POA: Diagnosis not present

## 2020-04-09 DIAGNOSIS — E559 Vitamin D deficiency, unspecified: Secondary | ICD-10-CM | POA: Diagnosis not present

## 2020-04-09 DIAGNOSIS — F39 Unspecified mood [affective] disorder: Secondary | ICD-10-CM | POA: Diagnosis not present

## 2020-04-09 DIAGNOSIS — I700 Atherosclerosis of aorta: Secondary | ICD-10-CM | POA: Diagnosis not present

## 2020-04-09 DIAGNOSIS — G4733 Obstructive sleep apnea (adult) (pediatric): Secondary | ICD-10-CM | POA: Diagnosis not present

## 2020-04-09 DIAGNOSIS — I2510 Atherosclerotic heart disease of native coronary artery without angina pectoris: Secondary | ICD-10-CM | POA: Diagnosis not present

## 2020-04-09 DIAGNOSIS — R7309 Other abnormal glucose: Secondary | ICD-10-CM | POA: Diagnosis not present

## 2020-04-09 DIAGNOSIS — R700 Elevated erythrocyte sedimentation rate: Secondary | ICD-10-CM | POA: Diagnosis not present

## 2020-04-09 DIAGNOSIS — E7849 Other hyperlipidemia: Secondary | ICD-10-CM | POA: Diagnosis not present

## 2020-04-18 ENCOUNTER — Ambulatory Visit: Payer: Medicare PPO | Admitting: Internal Medicine

## 2020-04-22 NOTE — Progress Notes (Signed)
Cardiology Office Note:    Date:  04/23/2020   ID:  Alexandria Wallace, DOB 19-Oct-1941, MRN [id number]  PCP:  Cari Caraway, MD  Cardiologist:  Pixie Casino, MD   Referring MD: Cari Caraway, MD   Chief Complaint  Patient presents with  . Follow-up    FH    History of Present Illness:    Alexandria Wallace is a 79 y.o. female with a HLD with hx of FH (per patient , in [place]). She was hesitant to take a statin. 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 She developed chest pain and eventually underwent LHC which showed no obstructive disease. She was last seen 12/16/18 and was awaiting pulmonology consultation. She responded well to high intensity statin with last LDL of 74 (236).  She presents today for follow up.  She is no longer taking crestor but doesn't remember why. She doesn't want to take a statin. She is on niacin. She has been working with an integrative medicine practice :Dr. Modena Nunnery at [facility] in [place] via zoom. We discussed her FH and she understands that she may not get to an LDL under 100 without a statin. Last LDL was 216 (250). She states she is doing very well, no CP, SOB, lower extremity swelling. She is walks nearly ever day, generally 1 mile.    Past Medical History:  Diagnosis Date  . High cholesterol   . Mood disorder [facility])     Past Surgical History:  Procedure Laterality Date  . COLECTOMY    . EUS N/A 11/29/2015   Procedure: LOWER ENDOSCOPIC ULTRASOUND (EUS);  Surgeon: Arta Silence, MD;  Location: [facility] ENDOSCOPY;  Service: Endoscopy;  Laterality: N/A;  . FLEXIBLE SIGMOIDOSCOPY N/A 01/27/2018   Procedure: FLEXIBLE SIGMOIDOSCOPY;  Surgeon: Otis Brace, MD;  Location: [place];  Service: Gastroenterology;  Laterality: N/A;  . LEFT HEART CATH AND CORONARY ANGIOGRAPHY N/A 11/15/2018   Procedure: LEFT HEART CATH AND CORONARY ANGIOGRAPHY;  Surgeon: Troy Sine, MD;  Location: [place] CV LAB;  Service: Cardiovascular;  Laterality: N/A;  . POLYPECTOMY  02/19/2016    Current Medications: Current Meds  Medication Sig  . acetaminophen (TYLENOL) 500 MG tablet Take 500 mg by mouth every 6 (six) hours as needed (for pain.).  Marland Kitchen Ascorbic Acid (VITAMIN C) 1000 MG tablet Take 1,000 mg by mouth every evening.  Marland Kitchen aspirin EC 81 MG tablet Take 81 mg by mouth at bedtime.  . busPIRone (BUSPAR) 10 MG tablet Take 10 mg by mouth 2 (two) times daily.  . Cholecalciferol (VITAMIN D3) 75 MCG (3000 UT) TABS Take 3,000 Units by mouth daily.  . Coenzyme Q10 (CO Q10) 100 MG CAPS Take 100 mg by mouth every evening.   Marland Kitchen FLUoxetine (PROZAC) 10 MG capsule Take 10 mg by mouth daily.  Marland Kitchen ibuprofen (ADVIL,MOTRIN) 200 MG tablet Take 200 mg by mouth every 8 (eight) hours as needed (for pain.).  Marland Kitchen Methylfol-Algae-B12-Acetylcyst (CEREFOLIN NAC) 6-90.314-2-600 MG TABS Take 1 tablet by mouth 2 (two) times daily.  . Omega-3 Fatty Acids (FISH OIL) 1000 MG CAPS Take 1,000 mg by mouth at bedtime.  . TURMERIC PO Take 448 mg by mouth every evening.  . vitamin B-12 (CYANOCOBALAMIN) 1000 MCG tablet Take 1,000 mcg by mouth daily.     Allergies:   Patient has no known allergies.   Social History   Socioeconomic History  . Marital status:  Legally Separated    Spouse name: Not on file  . Number of children: 2  . Years of education: master's  . Highest education level: Not on file  Occupational History  . Occupation: retired Pharmacist, hospital  Tobacco Use  . Smoking status: Never Smoker  . Smokeless tobacco: Never Used  Substance and Sexual Activity  . Alcohol use: Yes    Alcohol/week: 2.0 standard drinks    Types: 2 Glasses of wine per week  . Drug use: No  . Sexual activity: Not on file  Other Topics Concern  . Not on file  Social History Narrative   Exercise - low impact aerobics, weight class, walking 4-5x/week for 1 hour sessions   Epworth Sleepiness Scale = 6 (11/11/16)    Social Determinants of Health   Financial Resource Strain:   . Difficulty of Paying Living Expenses:   Food Insecurity:   . Worried About Charity fundraiser in the Last Year:   . Arboriculturist in the Last Year:   Transportation Needs:   . Film/video editor (Medical):   Marland Kitchen Lack of Transportation (Non-Medical):   Physical Activity:   . Days of Exercise per Week:   . Minutes of Exercise per Session:   Stress:   . Feeling of Stress :   Social Connections:   . Frequency of Communication with Friends and Family:   . Frequency of Social Gatherings with Friends and Family:   . Attends Religious Services:   . Active Member of Clubs or Organizations:   . Attends Archivist Meetings:   Marland Kitchen Marital Status:      Family History: The patient's family history includes Cancer in her father; Cancer (age of onset: 49) in her sister; Heart attack in her mother; Other (age of onset: 12) in her brother.  ROS:   Please see the history of present illness.     All other systems reviewed and are negative.  EKGs/Labs/Other Studies Reviewed:    The following studies were reviewed today:  Echo 11/2016 Study Conclusions   - Left ventricle: The cavity size was normal. Wall thickness was  normal. Systolic function was normal. The estimated ejection  fraction was in the range of 60% to 65%. Wall motion was normal;  there were no regional wall motion abnormalities. Doppler  parameters are consistent with abnormal left ventricular  relaxation (grade 1 diastolic dysfunction).  - Mitral valve: Calcified annulus. Mildly calcified leaflets .  - Impressions: The aortic valve is mildly calcified and thickened  with turbulence but no significant stenosis.   Impressions:   - The aortic valve is mildly calcified and thickened with  turbulence but no significant stenosis.  EKG:  EKG is  ordered today.  The ekg ordered today demonstrates sinus rhythm HR 84, evidence of biatrial  enlargement  Recent Labs: No results found for requested labs within last 8760 hours.  Recent Lipid Panel No results found for: CHOL, TRIG, HDL, CHOLHDL, VLDL, LDLCALC, LDLDIRECT  Physical Exam:    VS:  BP 110/60   Pulse 84   Temp 98.3 F (36.8 C)   Ht 5\' 4"  (1.626 m)   Wt 152 lb 3.2 oz (69 kg)   SpO2 97%   BMI 26.13 kg/m     Wt Readings from Last 3 Encounters:  04/23/20 152 lb 3.2 oz (69 kg)  01/11/19 168 lb (76.2 kg)  12/20/18 164 lb 6.4 oz (74.6 kg)     GEN: Well nourished, well developed  in no acute distress HEENT: Normal NECK: No JVD; No carotid bruits LYMPHATICS: No lymphadenopathy CARDIAC: RRR, no murmurs, rubs, gallops RESPIRATORY:  Clear to auscultation without rales, wheezing or rhonchi  ABDOMEN: Soft, non-tender, non-distended MUSCULOSKELETAL:  No edema; No deformity  SKIN: Warm and dry NEUROLOGIC:  Alert and oriented x 3 PSYCHIATRIC:  Normal affect   ASSESSMENT:    1. Familial hyperlipidemia   2. 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 Mixed hyperlipidemia   4. Mild diastolic dysfunction    PLAN:    In order of problems listed above:  Hyperlipidemia FH - history of FH - she does not want to take a statin - we discussed impact of high LDL on arteries throughout her body as well as heart valves and stroke risk - she may qualify for repatha with a noted diagnosis of FH (would need those labs) - if she is still not at goal, would refer to lipid clinic   Mild diastolic dysfunction - euvolemic on exam   Follow up in 1 year.   Medication Adjustments/Labs and Tests Ordered: Current medicines are reviewed at length with the patient today.  Concerns regarding medicines are outlined above.  Orders Placed This Encounter  Procedures  . EKG 12-Lead   No orders of the defined types were placed in this encounter.   Signed, Ledora Bottcher, [place]  04/23/2020 4:46 PM    [facility]

## 2020-04-23 ENCOUNTER — Encounter: Payer: Self-pay | Admitting: Physician Assistant

## 2020-04-23 ENCOUNTER — Other Ambulatory Visit: Payer: Self-pay

## 2020-04-23 ENCOUNTER — Ambulatory Visit: Payer: Medicare PPO | Admitting: Physician Assistant

## 2020-04-23 VITALS — BP 110/60 | HR 84 | Temp 98.3°F | Ht 64.0 in | Wt 152.2 lb

## 2020-04-23 DIAGNOSIS — R931 Abnormal findings on diagnostic imaging of heart and coronary circulation: Secondary | ICD-10-CM | POA: Diagnosis not present

## 2020-04-23 DIAGNOSIS — I519 Heart disease, unspecified: Secondary | ICD-10-CM | POA: Diagnosis not present

## 2020-04-23 DIAGNOSIS — E7849 Other hyperlipidemia: Secondary | ICD-10-CM

## 2020-04-23 DIAGNOSIS — E782 Mixed hyperlipidemia: Secondary | ICD-10-CM | POA: Diagnosis not present

## 2020-04-23 NOTE — Patient Instructions (Signed)
Medication Instructions:  °Your physician recommends that you continue on your current medications as directed. Please refer to the Current Medication list given to you today. ° °*If you need a refill on your cardiac medications before your next appointment, please call your pharmacy* ° ° ° °Follow-Up: °At [facility], you and your health needs are our priority.  As part of our continuing mission to provide you with exceptional heart care, we have created designated Provider Care Teams.  These Care Teams include your primary Cardiologist (physician) and Advanced Practice Providers (APPs -  Physician Assistants and Nurse Practitioners) who all work together to provide you with the care you need, when you need it. ° °We recommend signing up for the patient portal called "MyChart".  Sign up information is provided on this After Visit Summary.  MyChart is used to connect with patients for Virtual Visits (Telemedicine).  Patients are able to view lab/test results, encounter notes, upcoming appointments, etc.  Non-urgent messages can be sent to your provider as well.   °To learn more about what you can do with MyChart, go to [email or web address].   ° °Your next appointment:   °12 month(s) ° °The format for your next appointment:   °In Person ° °Provider:   °You may see Kenneth C Hilty, MD or one of the following Advanced Practice Providers on your designated Care Team:   °· Hao Meng, PA-C °· Angela Duke, PA-C or  °· Krista Kroeger, PA-C ° ° ° °Other Instructions °Please call our office 2 months in advance to schedule your follow-up appointment with Dr. Hilty. ° °

## 2020-04-24 DIAGNOSIS — F310 Bipolar disorder, current episode hypomanic: Secondary | ICD-10-CM | POA: Diagnosis not present

## 2020-05-01 DIAGNOSIS — M810 Age-related osteoporosis without current pathological fracture: Secondary | ICD-10-CM | POA: Diagnosis not present

## 2020-05-07 ENCOUNTER — Other Ambulatory Visit: Payer: Self-pay

## 2020-05-07 ENCOUNTER — Ambulatory Visit: Payer: Medicare PPO | Admitting: Physical Therapy

## 2020-05-07 ENCOUNTER — Emergency Department (HOSPITAL_COMMUNITY): Payer: Medicare PPO

## 2020-05-07 ENCOUNTER — Emergency Department (HOSPITAL_COMMUNITY)
Admission: EM | Admit: 2020-05-07 | Discharge: 2020-05-07 | Disposition: A | Discharge status: Home / Self Care | Payer: Medicare PPO | Attending: Emergency Medicine | Admitting: Emergency Medicine

## 2020-05-07 ENCOUNTER — Encounter (HOSPITAL_COMMUNITY): Payer: Self-pay

## 2020-05-07 DIAGNOSIS — Z7982 Long term (current) use of aspirin: Secondary | ICD-10-CM | POA: Insufficient documentation

## 2020-05-07 DIAGNOSIS — R1084 Generalized abdominal pain: Secondary | ICD-10-CM | POA: Diagnosis not present

## 2020-05-07 DIAGNOSIS — R109 Unspecified abdominal pain: Secondary | ICD-10-CM | POA: Diagnosis not present

## 2020-05-07 DIAGNOSIS — Z79899 Other long term (current) drug therapy: Secondary | ICD-10-CM | POA: Insufficient documentation

## 2020-05-07 DIAGNOSIS — R112 Nausea with vomiting, unspecified: Secondary | ICD-10-CM | POA: Diagnosis not present

## 2020-05-07 LAB — URINALYSIS, ROUTINE W REFLEX MICROSCOPIC
Bilirubin Urine: NEGATIVE
Glucose, UA: NEGATIVE mg/dL
Ketones, ur: 80 mg/dL — AB
Nitrite: NEGATIVE
Protein, ur: 30 mg/dL — AB
Specific Gravity, Urine: 1.027 (ref 1.005–1.030)
pH: 5 (ref 5.0–8.0)

## 2020-05-07 LAB — COMPREHENSIVE METABOLIC PANEL WITH GFR
ALT: 30 U/L (ref 0–44)
AST: 44 U/L — ABNORMAL HIGH (ref 15–41)
Albumin: 3.4 g/dL — ABNORMAL LOW (ref 3.5–5.0)
Alkaline Phosphatase: 80 U/L (ref 38–126)
Anion gap: 14 (ref 5–15)
BUN: 23 mg/dL (ref 8–23)
CO2: 20 mmol/L — ABNORMAL LOW (ref 22–32)
Calcium: 9.9 mg/dL (ref 8.9–10.3)
Chloride: 105 mmol/L (ref 98–111)
Creatinine, Ser: 1.06 mg/dL — ABNORMAL HIGH (ref 0.44–1.00)
GFR calc Af Amer: 59 mL/min — ABNORMAL LOW (ref >60)
GFR calc non Af Amer: 51 mL/min — ABNORMAL LOW (ref >60)
Glucose, Bld: 129 mg/dL — ABNORMAL HIGH (ref 70–99)
Potassium: 4.5 mmol/L (ref 3.5–5.1)
Sodium: 139 mmol/L (ref 135–145)
Total Bilirubin: 0.8 mg/dL (ref 0.3–1.2)
Total Protein: 6.7 g/dL (ref 6.5–8.1)

## 2020-05-07 LAB — CBC WITH DIFFERENTIAL/PLATELET
Abs Immature Granulocytes: 0.02 10*3/uL (ref 0.00–0.07)
Basophils Absolute: 0.1 10*3/uL (ref 0.0–0.1)
Basophils Relative: 1 %
Eosinophils Absolute: 0 10*3/uL (ref 0.0–0.5)
Eosinophils Relative: 0 %
HCT: 41.8 % (ref 36.0–46.0)
Hemoglobin: 13.8 g/dL (ref 12.0–15.0)
Immature Granulocytes: 0 %
Lymphocytes Relative: 15 %
Lymphs Abs: 1.2 10*3/uL (ref 0.7–4.0)
MCH: 34.1 pg — ABNORMAL HIGH (ref 26.0–34.0)
MCHC: 33 g/dL (ref 30.0–36.0)
MCV: 103.2 fL — ABNORMAL HIGH (ref 80.0–100.0)
Monocytes Absolute: 0.4 10*3/uL (ref 0.1–1.0)
Monocytes Relative: 6 %
Neutro Abs: 5.9 10*3/uL (ref 1.7–7.7)
Neutrophils Relative %: 78 %
Platelets: 257 10*3/uL (ref 150–400)
RBC: 4.05 MIL/uL (ref 3.87–5.11)
RDW: 13.2 % (ref 11.5–15.5)
WBC: 7.6 10*3/uL (ref 4.0–10.5)
nRBC: 0 % (ref 0.0–0.2)

## 2020-05-07 LAB — LACTIC ACID, PLASMA: Lactic Acid, Venous: 2.6 mmol/L (ref 0.5–1.9)

## 2020-05-07 LAB — LIPASE, BLOOD: Lipase: 26 U/L (ref 11–51)

## 2020-05-07 MED ORDER — SODIUM CHLORIDE 0.9 % IV BOLUS
1000.0000 mL | Freq: Once | INTRAVENOUS | Status: AC
  Administered 2020-05-07: 1000 mL via INTRAVENOUS

## 2020-05-07 MED ORDER — ONDANSETRON 4 MG PO TBDP
4.0000 mg | ORAL_TABLET | Freq: Three times a day (TID) | ORAL | 0 refills | Status: DC | PRN
Start: 2020-05-07 — End: 2020-06-08

## 2020-05-07 MED ORDER — ONDANSETRON HCL 4 MG/2ML IJ SOLN
4.0000 mg | Freq: Once | INTRAMUSCULAR | Status: AC
  Administered 2020-05-07: 4 mg via INTRAVENOUS
  Filled 2020-05-07: qty 2

## 2020-05-07 NOTE — ED Triage Notes (Signed)
Pt reports emesis and bad cramping since 6am this morning. No diarrhea. Hypotensive in triage

## 2020-05-07 NOTE — ED Provider Notes (Signed)
[facility] EMERGENCY DEPARTMENT Provider Note   CSN: [id number] Arrival date & time: 05/07/20  1123     History Chief Complaint  Patient presents with  . Abdominal Pain  . Emesis    Alexandria Wallace is a 80 y.o. female with a past medical history significant for hyperlipidemia and mood disorder who presents to the ED due to generalized abdominal pain associated with numerous episodes of nonbloody emesis that started earlier this morning.  Patient describes her emesis as "green" in color.  She notes she has had too many episodes to count.  Patient denies associated diarrhea.  Describes abdominal pain as "abdominal cramping".  Notes her abdominal pain and nausea has completely resolved during my initial evaluation.  Denies fever.  Admits to cold sweats.  No sick contacts or known Covid exposures.  She has had both Covid vaccines last 1 being March 9.  Denies associated urinary vaginal symptoms.  She has had a past colon resection and one other abdominal operation which she is unsure of the name.  Denies chest pain and shortness of breath.  History obtained from patient and past medical records. No interpreter used during encounter.      Past Medical History:  Diagnosis Date  . High cholesterol   . Mood disorder [facility])     Patient Active Problem List   Diagnosis Date Noted  . Dyspnea   . 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 Murmur 11/11/2016  . Mixed hyperlipidemia 11/11/2016    Past Surgical History:  Procedure Laterality Date  . COLECTOMY    . EUS N/A 11/29/2015   Procedure: LOWER ENDOSCOPIC ULTRASOUND (EUS);  Surgeon: Arta Silence, MD;  Location: [facility] ENDOSCOPY;  Service: Endoscopy;  Laterality: N/A;  . FLEXIBLE SIGMOIDOSCOPY N/A 01/27/2018   Procedure: FLEXIBLE SIGMOIDOSCOPY;  Surgeon: Otis Brace, MD;  Location: [place];  Service: Gastroenterology;  Laterality: N/A;  . LEFT HEART CATH AND CORONARY ANGIOGRAPHY N/A  11/15/2018   Procedure: LEFT HEART CATH AND CORONARY ANGIOGRAPHY;  Surgeon: Troy Sine, MD;  Location: [place] CV LAB;  Service: Cardiovascular;  Laterality: N/A;  . POLYPECTOMY  02/19/2016     OB History   No obstetric history on file.     Family History  Problem Relation Age of Onset  . Heart attack Mother   . Cancer Father   . Cancer Sister 19  . Other Brother 10       meningitis     Social History   Tobacco Use  . Smoking status: Never Smoker  . Smokeless tobacco: Never Used  Substance Use Topics  . Alcohol use: Yes    Alcohol/week: 2.0 standard drinks    Types: 2 Glasses of wine per week  . Drug use: No    Home Medications Prior to Admission medications   Medication Sig Start Date End Date Taking? Authorizing Provider  acetaminophen (TYLENOL) 500 MG tablet Take 500 mg by mouth every 6 (six) hours as needed (for pain.).    [provider]  Ascorbic Acid (VITAMIN C) 1000 MG tablet Take 1,000 mg by mouth every evening.    [provider]  aspirin EC 81 MG tablet Take 81 mg by mouth at bedtime.    [provider]  busPIRone (BUSPAR) 10 MG tablet Take 10 mg by mouth 2 (two) times daily.    [provider]  Cholecalciferol (VITAMIN D3) 75 MCG (3000 UT) TABS Take 3,000 Units by mouth daily.  [provider]  Coenzyme Q10 (CO Q10) 100 MG CAPS Take 100 mg by mouth every evening.     [provider]  FLUoxetine (PROZAC) 10 MG capsule Take 10 mg by mouth daily.    [provider]  ibuprofen (ADVIL,MOTRIN) 200 MG tablet Take 200 mg by mouth every 8 (eight) hours as needed (for pain.).    [provider]  Methylfol-Algae-B12-Acetylcyst (CEREFOLIN NAC) 6-90.314-2-600 MG TABS Take 1 tablet by mouth 2 (two) times daily.    [provider]  Multiple Vitamin (MULTIVITAMIN WITH MINERALS) TABS tablet Take 1 tablet by mouth every evening.    [provider]  niacin 500 MG tablet Take 500  mg by mouth 3 (three) times daily with meals.    [provider]  Omega-3 Fatty Acids (FISH OIL) 1000 MG CAPS Take 1,000 mg by mouth at bedtime.    [provider]  ondansetron (ZOFRAN ODT) 4 MG disintegrating tablet Take 1 tablet (4 mg total) by mouth every 8 (eight) hours as needed for nausea or vomiting. 05/07/20   Suzy Bouchard, PA-C  TURMERIC PO Take 448 mg by mouth every evening.    [provider]  vitamin B-12 (CYANOCOBALAMIN) 1000 MCG tablet Take 1,000 mcg by mouth daily.    [provider]  ZETIA 10 MG tablet Take 10 mg by mouth every evening.  10/19/15   [provider]    Allergies    Patient has no known allergies.  Review of Systems   Review of Systems  Constitutional: Positive for chills. Negative for fever.  Respiratory: Negative for shortness of breath.   Cardiovascular: Negative for chest pain.  Gastrointestinal: Positive for abdominal pain (resolved), nausea and vomiting. Negative for diarrhea.  Genitourinary: Negative for dysuria and vaginal discharge.  Musculoskeletal: Negative for back pain.  All other systems reviewed and are negative.   Physical Exam Updated Vital Signs BP (!) 108/56   Pulse 87   Temp (!) 97.5 F (36.4 C) (Oral)   Resp 16   Ht 5\' 4"  (1.626 m)   Wt 68 kg   SpO2 99%   BMI 25.75 kg/m   Physical Exam Vitals and nursing note reviewed.  Constitutional:      General: She is not in acute distress.    Appearance: She is not toxic-appearing.  HENT:     Head: Normocephalic.  Eyes:     Pupils: Pupils are equal, round, and reactive to light.  Cardiovascular:     Rate and Rhythm: Normal rate and regular rhythm.     Pulses: Normal pulses.     Heart sounds: Normal heart sounds. No murmur. No friction rub. No gallop.   Pulmonary:     Effort: Pulmonary effort is normal.     Breath sounds: Normal breath sounds.  Abdominal:     General: Abdomen is flat. There is no distension.     Palpations:  Abdomen is soft.     Tenderness: There is no abdominal tenderness. There is no guarding or rebound.     Comments: Abdomen soft, nondistended, nontender to palpation in all quadrants without guarding or peritoneal signs. No rebound.   Musculoskeletal:     Cervical back: Neck supple.     Comments: Able to move all 4 extremities without difficulty.   Skin:    General: Skin is warm and dry.  Neurological:     General: No focal deficit present.     Mental Status: She is alert.  Psychiatric:  Mood and Affect: Mood normal.        Behavior: Behavior normal.     ED Results / Procedures / Treatments   Labs (all labs ordered are listed, but only abnormal results are displayed) Labs Reviewed  LACTIC ACID, PLASMA - Abnormal; Notable for the following components:      Result Value   Lactic Acid, Venous 2.6 (*)    All other components within normal limits  COMPREHENSIVE METABOLIC PANEL - Abnormal; Notable for the following components:   CO2 20 (*)    Glucose, Bld 129 (*)    Creatinine, Ser 1.06 (*)    Albumin 3.4 (*)    AST 44 (*)    GFR calc non Af Amer 51 (*)    GFR calc Af Amer 59 (*)    All other components within normal limits  CBC WITH DIFFERENTIAL/PLATELET - Abnormal; Notable for the following components:   MCV 103.2 (*)    MCH 34.1 (*)    All other components within normal limits  URINALYSIS, ROUTINE W REFLEX MICROSCOPIC - Abnormal; Notable for the following components:   APPearance HAZY (*)    Hgb urine dipstick SMALL (*)    Ketones, ur 80 (*)    Protein, ur 30 (*)    Leukocytes,Ua SMALL (*)    Bacteria, UA RARE (*)    Non Squamous Epithelial 0-5 (*)    All other components within normal limits  URINE CULTURE  LIPASE, BLOOD    EKG EKG Interpretation  Date/Time:  Tuesday May 07 2020 11:55:41 EDT Ventricular Rate:  77 PR Interval:  130 QRS Duration: 78 QT Interval:  416 QTC Calculation: 470 R Axis:   81 Text Interpretation: Normal sinus rhythm with sinus  arrhythmia Right atrial enlargement Nonspecific ST abnormality Abnormal ECG No acute changes Confirmed by Varney Biles [phone]) on 05/07/2020 1:59:45 PM   Radiology DG Abdomen 1 View  Result Date: 05/07/2020 CLINICAL DATA:  Onset abdominal pain and cramping at 6 a.m. this morning. EXAM: ABDOMEN - 1 VIEW COMPARISON:  None. FINDINGS: The bowel gas pattern is normal. Large stool burden throughout the colon is seen. No radio-opaque calculi or other significant radiographic abnormality are seen. Suture material in the central pelvis incidentally noted. IMPRESSION: No acute finding. Large colonic stool burden. Electronically Signed   By: Inge Rise M.D.   On: 05/07/2020 14:41    Procedures Procedures (including critical care time)  Medications Ordered in ED Medications  ondansetron (ZOFRAN) injection 4 mg (4 mg Intravenous Given 05/07/20 1207)  sodium chloride 0.9 % bolus 1,000 mL (1,000 mLs Intravenous Bolus from Bag 05/07/20 1401)    ED Course  I have reviewed the triage vital signs and the nursing notes.  Pertinent labs & imaging results that were available during my care of the patient were reviewed by me and considered in my medical decision making (see chart for details).    MDM Rules/Calculators/A&P                     79 year old female presents to the ED due to generalized abdominal pain associated with numerous episodes of "green" emesis that started this morning. Upon initial evaluation, all symptoms have completely resolved. Patient given Zofran in triage.  Upon arrival, patient afebrile, not tachycardic or hypoxic.  Hypotension noted upon initial evaluation at 85/50.  Patient in no acute distress and nontoxic-appearing.  Physical exam reassuring.  Abdomen soft, nondistended, and nontender. Low suspicion for acute abdomen at this time. No  RUQ tenderness. No distention to suggest obstruction. Negative CVA tenderness bilaterally.  Routine labs ordered at triage. Will hold off on CT  abdomen at this point given no abdominal tenderness. Will obtain KUB.   CBC reassuring with no leukocytosis and normal hemoglobin.  Lipase normal at 26.  Doubt pancreatitis.  CMP significant for mild elevated creatinine at 1.06 and AST at 44, but otherwise reassuring. Lactic acid elevated at 2.5. IVFs given to patient after initial evaluation. KUB personally reviewed which demonstrates: IMPRESSION:  No acute finding.    Large colonic stool burden.   4:04 PM reassessed patient at bedside who notes complete resolution in symptoms.  Abdomen soft, non-distended, and non-tender. Patient able to tolerate p.o. at bedside.  UA significant for small leukocytes, small hematuria and rare bacteria.  Urine culture pending.  Will hold off on antibiotics at this time given patient is currently asymptomatic.  patient discharged with Zofran as needed for nausea. BP improved throughout patient's ED stay. Through chart review, it appears patient typically has a soft BP. Able to ambulate without difficulty. Advised patient to follow-up with PCP within the next week if symptoms return. Strict ED precautions discussed with patient. Patient states understanding and agrees to plan. Patient discharged home in no acute distress and stable vitals.  Discussed case with Dr. Kathrynn Humble who evaluated patient at bedside and agrees with assessment and plan.   Final Clinical Impression(s) / ED Diagnoses Final diagnoses:  Generalized abdominal pain  Non-intractable vomiting with nausea, unspecified vomiting type    Rx / DC Orders ED Discharge Orders         Ordered    ondansetron (ZOFRAN ODT) 4 MG disintegrating tablet  Every 8 hours PRN     05/07/20 1605           Suzy Bouchard, PA-C 05/07/20 Blanco, Ankit, MD 05/08/20 [phone]

## 2020-05-07 NOTE — Discharge Instructions (Addendum)
As discussed, all of your labs are reassuring.  I am sending home with nausea medication.  Take as prescribed.  Please follow-up with your PCP within the next week for further evaluation.  Return to the ER for new or worsening symptoms.

## 2020-05-08 LAB — URINE CULTURE

## 2020-06-05 ENCOUNTER — Inpatient Hospital Stay (HOSPITAL_COMMUNITY): Payer: Medicare PPO

## 2020-06-05 ENCOUNTER — Emergency Department (HOSPITAL_COMMUNITY): Payer: Medicare PPO

## 2020-06-05 ENCOUNTER — Encounter (HOSPITAL_COMMUNITY): Payer: Self-pay

## 2020-06-05 ENCOUNTER — Other Ambulatory Visit: Payer: Self-pay

## 2020-06-05 ENCOUNTER — Inpatient Hospital Stay (HOSPITAL_COMMUNITY)
Admission: EM | Admit: 2020-06-05 | Discharge: 2020-06-08 | DRG: 640 | Disposition: A | Discharge status: Home / Self Care | Payer: Medicare PPO | Attending: Internal Medicine | Admitting: Internal Medicine

## 2020-06-05 DIAGNOSIS — R9431 Abnormal electrocardiogram [ECG] [EKG]: Secondary | ICD-10-CM | POA: Diagnosis not present

## 2020-06-05 DIAGNOSIS — E8729 Other acidosis: Secondary | ICD-10-CM

## 2020-06-05 DIAGNOSIS — R778 Other specified abnormalities of plasma proteins: Secondary | ICD-10-CM

## 2020-06-05 DIAGNOSIS — R519 Headache, unspecified: Secondary | ICD-10-CM | POA: Diagnosis not present

## 2020-06-05 DIAGNOSIS — J9690 Respiratory failure, unspecified, unspecified whether with hypoxia or hypercapnia: Secondary | ICD-10-CM | POA: Diagnosis not present

## 2020-06-05 DIAGNOSIS — R109 Unspecified abdominal pain: Secondary | ICD-10-CM | POA: Diagnosis not present

## 2020-06-05 DIAGNOSIS — R011 Cardiac murmur, unspecified: Secondary | ICD-10-CM | POA: Diagnosis present

## 2020-06-05 DIAGNOSIS — Z79899 Other long term (current) drug therapy: Secondary | ICD-10-CM | POA: Diagnosis not present

## 2020-06-05 DIAGNOSIS — R571 Hypovolemic shock: Secondary | ICD-10-CM | POA: Diagnosis not present

## 2020-06-05 DIAGNOSIS — R1013 Epigastric pain: Secondary | ICD-10-CM | POA: Diagnosis not present

## 2020-06-05 DIAGNOSIS — E872 Acidosis: Secondary | ICD-10-CM | POA: Diagnosis present

## 2020-06-05 DIAGNOSIS — R6521 Severe sepsis with septic shock: Secondary | ICD-10-CM | POA: Diagnosis present

## 2020-06-05 DIAGNOSIS — R7989 Other specified abnormal findings of blood chemistry: Secondary | ICD-10-CM

## 2020-06-05 DIAGNOSIS — K219 Gastro-esophageal reflux disease without esophagitis: Secondary | ICD-10-CM | POA: Diagnosis present

## 2020-06-05 DIAGNOSIS — Q211 Atrial septal defect: Secondary | ICD-10-CM

## 2020-06-05 DIAGNOSIS — Z9109 Other allergy status, other than to drugs and biological substances: Secondary | ICD-10-CM

## 2020-06-05 DIAGNOSIS — J90 Pleural effusion, not elsewhere classified: Secondary | ICD-10-CM | POA: Diagnosis not present

## 2020-06-05 DIAGNOSIS — Z7982 Long term (current) use of aspirin: Secondary | ICD-10-CM | POA: Diagnosis not present

## 2020-06-05 DIAGNOSIS — K449 Diaphragmatic hernia without obstruction or gangrene: Secondary | ICD-10-CM | POA: Diagnosis not present

## 2020-06-05 DIAGNOSIS — K5900 Constipation, unspecified: Secondary | ICD-10-CM | POA: Diagnosis present

## 2020-06-05 DIAGNOSIS — A419 Sepsis, unspecified organism: Secondary | ICD-10-CM | POA: Diagnosis not present

## 2020-06-05 DIAGNOSIS — H53149 Visual discomfort, unspecified: Secondary | ICD-10-CM | POA: Diagnosis not present

## 2020-06-05 DIAGNOSIS — Z8249 Family history of ischemic heart disease and other diseases of the circulatory system: Secondary | ICD-10-CM | POA: Diagnosis not present

## 2020-06-05 DIAGNOSIS — I959 Hypotension, unspecified: Secondary | ICD-10-CM

## 2020-06-05 DIAGNOSIS — R112 Nausea with vomiting, unspecified: Secondary | ICD-10-CM | POA: Diagnosis present

## 2020-06-05 DIAGNOSIS — R739 Hyperglycemia, unspecified: Secondary | ICD-10-CM | POA: Diagnosis not present

## 2020-06-05 DIAGNOSIS — N179 Acute kidney failure, unspecified: Secondary | ICD-10-CM | POA: Diagnosis not present

## 2020-06-05 DIAGNOSIS — I248 Other forms of acute ischemic heart disease: Secondary | ICD-10-CM | POA: Diagnosis not present

## 2020-06-05 DIAGNOSIS — I700 Atherosclerosis of aorta: Secondary | ICD-10-CM | POA: Diagnosis not present

## 2020-06-05 DIAGNOSIS — Z800 Family history of malignant neoplasm of digestive organs: Secondary | ICD-10-CM

## 2020-06-05 DIAGNOSIS — R5381 Other malaise: Secondary | ICD-10-CM | POA: Diagnosis present

## 2020-06-05 DIAGNOSIS — D539 Nutritional anemia, unspecified: Secondary | ICD-10-CM | POA: Diagnosis present

## 2020-06-05 DIAGNOSIS — R0600 Dyspnea, unspecified: Secondary | ICD-10-CM | POA: Diagnosis present

## 2020-06-05 DIAGNOSIS — E782 Mixed hyperlipidemia: Secondary | ICD-10-CM | POA: Diagnosis present

## 2020-06-05 DIAGNOSIS — Z9049 Acquired absence of other specified parts of digestive tract: Secondary | ICD-10-CM

## 2020-06-05 DIAGNOSIS — R110 Nausea: Secondary | ICD-10-CM | POA: Diagnosis not present

## 2020-06-05 DIAGNOSIS — Z9889 Other specified postprocedural states: Secondary | ICD-10-CM | POA: Diagnosis not present

## 2020-06-05 DIAGNOSIS — I10 Essential (primary) hypertension: Secondary | ICD-10-CM | POA: Diagnosis not present

## 2020-06-05 DIAGNOSIS — K5730 Diverticulosis of large intestine without perforation or abscess without bleeding: Secondary | ICD-10-CM | POA: Diagnosis not present

## 2020-06-05 DIAGNOSIS — Z20822 Contact with and (suspected) exposure to covid-19: Secondary | ICD-10-CM | POA: Diagnosis present

## 2020-06-05 DIAGNOSIS — E869 Volume depletion, unspecified: Secondary | ICD-10-CM | POA: Diagnosis not present

## 2020-06-05 DIAGNOSIS — F39 Unspecified mood [affective] disorder: Secondary | ICD-10-CM | POA: Diagnosis not present

## 2020-06-05 DIAGNOSIS — I361 Nonrheumatic tricuspid (valve) insufficiency: Secondary | ICD-10-CM | POA: Diagnosis not present

## 2020-06-05 DIAGNOSIS — I2510 Atherosclerotic heart disease of native coronary artery without angina pectoris: Secondary | ICD-10-CM | POA: Diagnosis present

## 2020-06-05 LAB — TYPE AND SCREEN
ABO/RH(D): O POS
Antibody Screen: NEGATIVE

## 2020-06-05 LAB — CBC WITH DIFFERENTIAL/PLATELET
Abs Immature Granulocytes: 0.07 10*3/uL (ref 0.00–0.07)
Basophils Absolute: 0.1 10*3/uL (ref 0.0–0.1)
Basophils Relative: 0 %
Eosinophils Absolute: 0 10*3/uL (ref 0.0–0.5)
Eosinophils Relative: 0 %
HCT: 32.6 % — ABNORMAL LOW (ref 36.0–46.0)
Hemoglobin: 10.4 g/dL — ABNORMAL LOW (ref 12.0–15.0)
Immature Granulocytes: 1 %
Lymphocytes Relative: 9 %
Lymphs Abs: 1.2 10*3/uL (ref 0.7–4.0)
MCH: 34.1 pg — ABNORMAL HIGH (ref 26.0–34.0)
MCHC: 31.9 g/dL (ref 30.0–36.0)
MCV: 106.9 fL — ABNORMAL HIGH (ref 80.0–100.0)
Monocytes Absolute: 0.9 10*3/uL (ref 0.1–1.0)
Monocytes Relative: 7 %
Neutro Abs: 10.9 10*3/uL — ABNORMAL HIGH (ref 1.7–7.7)
Neutrophils Relative %: 83 %
Platelets: 214 10*3/uL (ref 150–400)
RBC: 3.05 MIL/uL — ABNORMAL LOW (ref 3.87–5.11)
RDW: 13.5 % (ref 11.5–15.5)
WBC: 13.1 10*3/uL — ABNORMAL HIGH (ref 4.0–10.5)
nRBC: 0 % (ref 0.0–0.2)

## 2020-06-05 LAB — URINALYSIS, ROUTINE W REFLEX MICROSCOPIC
Bilirubin Urine: NEGATIVE
Glucose, UA: NEGATIVE mg/dL
Ketones, ur: 20 mg/dL — AB
Nitrite: NEGATIVE
Protein, ur: 30 mg/dL — AB
Specific Gravity, Urine: 1.016 (ref 1.005–1.030)
pH: 5 (ref 5.0–8.0)

## 2020-06-05 LAB — LIPASE, BLOOD: Lipase: 21 U/L (ref 11–51)

## 2020-06-05 LAB — HEPATIC FUNCTION PANEL
ALT: 38 U/L (ref 0–44)
AST: 46 U/L — ABNORMAL HIGH (ref 15–41)
Albumin: 2.5 g/dL — ABNORMAL LOW (ref 3.5–5.0)
Alkaline Phosphatase: 51 U/L (ref 38–126)
Bilirubin, Direct: 0.2 mg/dL (ref 0.0–0.2)
Indirect Bilirubin: 0.3 mg/dL (ref 0.3–0.9)
Total Bilirubin: 0.5 mg/dL (ref 0.3–1.2)
Total Protein: 4.8 g/dL — ABNORMAL LOW (ref 6.5–8.1)

## 2020-06-05 LAB — GLUCOSE, CAPILLARY: Glucose-Capillary: 171 mg/dL — ABNORMAL HIGH (ref 70–99)

## 2020-06-05 LAB — LACTIC ACID, PLASMA
Lactic Acid, Venous: 9.2 mmol/L (ref 0.5–1.9)
Lactic Acid, Venous: 7.3 mmol/L (ref 0.5–1.9)
Lactic Acid, Venous: 5.8 mmol/L (ref 0.5–1.9)

## 2020-06-05 LAB — MAGNESIUM: Magnesium: 1.3 mg/dL — ABNORMAL LOW (ref 1.7–2.4)

## 2020-06-05 LAB — BASIC METABOLIC PANEL
Anion gap: 18 — ABNORMAL HIGH (ref 5–15)
BUN: 14 mg/dL (ref 8–23)
CO2: 14 mmol/L — ABNORMAL LOW (ref 22–32)
Calcium: 7.7 mg/dL — ABNORMAL LOW (ref 8.9–10.3)
Chloride: 111 mmol/L (ref 98–111)
Creatinine, Ser: 1.66 mg/dL — ABNORMAL HIGH (ref 0.44–1.00)
GFR calc Af Amer: 34 mL/min — ABNORMAL LOW (ref >60)
GFR calc non Af Amer: 29 mL/min — ABNORMAL LOW (ref >60)
Glucose, Bld: 93 mg/dL (ref 70–99)
Potassium: 3.8 mmol/L (ref 3.5–5.1)
Sodium: 143 mmol/L (ref 135–145)

## 2020-06-05 LAB — CSF CELL COUNT WITH DIFFERENTIAL
RBC Count, CSF: 1 /mm3 — ABNORMAL HIGH
RBC Count, CSF: 1 /mm3 — ABNORMAL HIGH
Tube #: 1
Tube #: 4
WBC, CSF: 1 /mm3 (ref 0–5)
WBC, CSF: 1 /mm3 (ref 0–5)

## 2020-06-05 LAB — GLUCOSE, CSF: Glucose, CSF: 60 mg/dL (ref 40–70)

## 2020-06-05 LAB — MRSA PCR SCREENING: MRSA by PCR: NEGATIVE

## 2020-06-05 LAB — PROTEIN, CSF: Total  Protein, CSF: 26 mg/dL (ref 15–45)

## 2020-06-05 LAB — TROPONIN I (HIGH SENSITIVITY)
Troponin I (High Sensitivity): 14 ng/L (ref <18)
Troponin I (High Sensitivity): 47 ng/L — ABNORMAL HIGH (ref <18)

## 2020-06-05 LAB — ABO/RH: ABO/RH(D): O POS

## 2020-06-05 LAB — SARS CORONAVIRUS 2 BY RT PCR (HOSPITAL ORDER, PERFORMED IN ~~LOC~~ HOSPITAL LAB): SARS Coronavirus 2: NEGATIVE

## 2020-06-05 LAB — OCCULT BLOOD X 1 CARD TO LAB, STOOL: Fecal Occult Bld: NEGATIVE

## 2020-06-05 MED ORDER — NOREPINEPHRINE 4 MG/250ML-% IV SOLN: 0.0000 ug/min | INTRAVENOUS | Status: DC

## 2020-06-05 MED ORDER — ASCORBIC ACID 500 MG PO TABS
1000.0000 mg | ORAL_TABLET | Freq: Every evening | ORAL | Status: DC
  Administered 2020-06-06 – 2020-06-07 (×2): 1000 mg via ORAL
  Filled 2020-06-05 (×3): qty 2

## 2020-06-05 MED ORDER — VANCOMYCIN HCL 1250 MG/250ML IV SOLN
1250.0000 mg | Freq: Once | INTRAVENOUS | Status: AC
  Administered 2020-06-05: 1250 mg via INTRAVENOUS
  Filled 2020-06-05: qty 250

## 2020-06-05 MED ORDER — BUSPIRONE HCL 10 MG PO TABS
10.0000 mg | ORAL_TABLET | Freq: Two times a day (BID) | ORAL | Status: DC
  Administered 2020-06-06 – 2020-06-08 (×4): 10 mg via ORAL
  Filled 2020-06-05 (×3): qty 2
  Filled 2020-06-05: qty 1
  Filled 2020-06-05: qty 2

## 2020-06-05 MED ORDER — EZETIMIBE 10 MG PO TABS
10.0000 mg | ORAL_TABLET | Freq: Every evening | ORAL | Status: DC
  Administered 2020-06-06 – 2020-06-07 (×2): 10 mg via ORAL
  Filled 2020-06-05 (×4): qty 1

## 2020-06-05 MED ORDER — LACTATED RINGERS IV BOLUS
1000.0000 mL | Freq: Once | INTRAVENOUS | Status: AC
  Administered 2020-06-05: 1000 mL via INTRAVENOUS

## 2020-06-05 MED ORDER — ONDANSETRON HCL 4 MG/2ML IJ SOLN
4.0000 mg | Freq: Four times a day (QID) | INTRAMUSCULAR | Status: DC | PRN
  Administered 2020-06-05 – 2020-06-07 (×3): 4 mg via INTRAVENOUS
  Filled 2020-06-05 (×4): qty 2

## 2020-06-05 MED ORDER — ASPIRIN 325 MG PO TABS
325.0000 mg | ORAL_TABLET | Freq: Every day | ORAL | Status: DC
  Administered 2020-06-06 – 2020-06-08 (×4): 325 mg via ORAL
  Filled 2020-06-05 (×4): qty 1

## 2020-06-05 MED ORDER — ACETAMINOPHEN 325 MG PO TABS
650.0000 mg | ORAL_TABLET | ORAL | Status: DC | PRN
  Administered 2020-06-05: 650 mg via ORAL
  Filled 2020-06-05: qty 2

## 2020-06-05 MED ORDER — SODIUM CHLORIDE 0.9 % IV BOLUS
1000.0000 mL | Freq: Once | INTRAVENOUS | Status: AC
  Administered 2020-06-05: 1000 mL via INTRAVENOUS

## 2020-06-05 MED ORDER — LACTATED RINGERS IV SOLN: INTRAVENOUS | Status: DC

## 2020-06-05 MED ORDER — FISH OIL 1000 MG PO CAPS: 1000.0000 mg | ORAL_CAPSULE | Freq: Every day | ORAL | Status: DC

## 2020-06-05 MED ORDER — NOREPINEPHRINE 4 MG/250ML-% IV SOLN
0.0000 ug/min | INTRAVENOUS | Status: DC
  Administered 2020-06-05: 2 ug/min via INTRAVENOUS
  Administered 2020-06-05: 16 ug/min via INTRAVENOUS
  Administered 2020-06-06: 6 ug/min via INTRAVENOUS
  Administered 2020-06-06: 14 ug/min via INTRAVENOUS
  Filled 2020-06-05 (×5): qty 250

## 2020-06-05 MED ORDER — CO Q10 100 MG PO CAPS: 100.0000 mg | ORAL_CAPSULE | Freq: Every evening | ORAL | Status: DC

## 2020-06-05 MED ORDER — DEXTROSE 5 % IV SOLN
500.0000 mg | Freq: Two times a day (BID) | INTRAVENOUS | Status: DC
  Administered 2020-06-06: 500 mg via INTRAVENOUS
  Filled 2020-06-05 (×4): qty 10

## 2020-06-05 MED ORDER — OMEGA-3-ACID ETHYL ESTERS 1 G PO CAPS
1.0000 g | ORAL_CAPSULE | Freq: Every day | ORAL | Status: DC
  Administered 2020-06-06 – 2020-06-07 (×2): 1 g via ORAL
  Filled 2020-06-05 (×3): qty 1

## 2020-06-05 MED ORDER — VITAMIN B-12 1000 MCG PO TABS
1000.0000 ug | ORAL_TABLET | Freq: Every day | ORAL | Status: DC
  Administered 2020-06-07 – 2020-06-08 (×2): 1000 ug via ORAL
  Filled 2020-06-05 (×2): qty 1

## 2020-06-05 MED ORDER — DOCUSATE SODIUM 100 MG PO CAPS: 100.0000 mg | ORAL_CAPSULE | Freq: Two times a day (BID) | ORAL | Status: DC | PRN

## 2020-06-05 MED ORDER — ASPIRIN EC 81 MG PO TBEC: 81.0000 mg | DELAYED_RELEASE_TABLET | Freq: Every day | ORAL | Status: DC

## 2020-06-05 MED ORDER — IBUPROFEN 200 MG PO TABS: 200.0000 mg | ORAL_TABLET | Freq: Three times a day (TID) | ORAL | Status: DC | PRN

## 2020-06-05 MED ORDER — SODIUM CHLORIDE 0.9 % IV SOLN
2.0000 g | Freq: Once | INTRAVENOUS | Status: AC
  Administered 2020-06-05: 2 g via INTRAVENOUS
  Filled 2020-06-05: qty 2

## 2020-06-05 MED ORDER — LACTATED RINGERS IV BOLUS
1000.0000 mL | Freq: Once | INTRAVENOUS | Status: AC
  Administered 2020-06-08: 1000 mL via INTRAVENOUS

## 2020-06-05 MED ORDER — VANCOMYCIN HCL IN DEXTROSE 1-5 GM/200ML-% IV SOLN: 1000.0000 mg | INTRAVENOUS | Status: DC

## 2020-06-05 MED ORDER — SODIUM CHLORIDE 0.9 % IV SOLN
2.0000 g | Freq: Two times a day (BID) | INTRAVENOUS | Status: DC
  Administered 2020-06-06 – 2020-06-08 (×5): 2 g via INTRAVENOUS
  Filled 2020-06-05 (×6): qty 2

## 2020-06-05 MED ORDER — FLUOXETINE HCL 10 MG PO CAPS
10.0000 mg | ORAL_CAPSULE | Freq: Every day | ORAL | Status: DC
  Administered 2020-06-06 – 2020-06-08 (×3): 10 mg via ORAL
  Filled 2020-06-05 (×4): qty 1

## 2020-06-05 MED ORDER — VITAMIN D 25 MCG (1000 UNIT) PO TABS
3000.0000 [IU] | ORAL_TABLET | Freq: Every day | ORAL | Status: DC
  Administered 2020-06-07 – 2020-06-08 (×2): 3000 [IU] via ORAL
  Filled 2020-06-05 (×2): qty 3

## 2020-06-05 MED ORDER — DEXTROSE 5 % IV SOLN
500.0000 mg | Freq: Once | INTRAVENOUS | Status: AC
  Administered 2020-06-05: 500 mg via INTRAVENOUS
  Filled 2020-06-05: qty 10

## 2020-06-05 MED ORDER — POLYETHYLENE GLYCOL 3350 17 G PO PACK: 17.0000 g | PACK | Freq: Every day | ORAL | Status: DC | PRN

## 2020-06-05 MED ORDER — NOREPINEPHRINE 4 MG/250ML-% IV SOLN
INTRAVENOUS | Status: AC
  Filled 2020-06-05: qty 250

## 2020-06-05 MED ORDER — ADULT MULTIVITAMIN W/MINERALS CH
1.0000 | ORAL_TABLET | Freq: Every evening | ORAL | Status: DC
  Administered 2020-06-06 – 2020-06-07 (×2): 1 via ORAL
  Filled 2020-06-05 (×3): qty 1

## 2020-06-05 MED ORDER — HEPARIN SODIUM (PORCINE) 5000 UNIT/ML IJ SOLN: 5000.0000 [IU] | Freq: Three times a day (TID) | INTRAMUSCULAR | Status: DC

## 2020-06-05 MED ORDER — NIACIN 500 MG PO TABS
500.0000 mg | ORAL_TABLET | Freq: Three times a day (TID) | ORAL | Status: DC
  Filled 2020-06-05 (×11): qty 1

## 2020-06-05 MED ORDER — MAGNESIUM SULFATE 2 GM/50ML IV SOLN
2.0000 g | Freq: Once | INTRAVENOUS | Status: AC
  Administered 2020-06-05: 2 g via INTRAVENOUS
  Filled 2020-06-05: qty 50

## 2020-06-05 NOTE — H&P (Signed)
NAME:  Alexandria Wallace, MRN:  [id number], DOB:  Apr 19, 1941, LOS: 0 ADMISSION DATE:  06/05/2020, CONSULTATION DATE:  06/05/20 REFERRING MD:  Wilson Singer  CHIEF COMPLAINT:  Headache, nausea, vomiting   Brief History   Alexandria Wallace is a 79 y.o. female who was admitted 7/7 with septic shock and concern for meningitis.  History of present illness   Alexandria Wallace is a 79 y.o. female who has a PMH including but not limited to mood disorder, HLD (see "past medical history" for rest).  She presented to [facility] ED 7/7 with nausea, vomiting, headache.  She was in her usual state of health when she went to bed the night prior.  After waking up and experiencing symptoms, she later developed posterior headache with photophobia.  Denies any lightheadedness, nuchal rigidity, syncope, fevers/chills/sweats, chest pain, dyspnea, myalgias, rashes, exposures to known sick contacts.  In ED, she was hypotensive and somewhat altered.  Head CT was negative.  Due to constellation of symptoms, LP was performed in ED and she was started on empiric vanc / cefepime.  Due to persistent hypotension, CVL was placed and she was started on norepinephrine.  CT abd / pelv also ordered to r/o intraabdominal process.  Past Medical History  has Murmur; Mixed hyperlipidemia; Dyspnea; [place] coronary artery calcium score between 100 and 199; and Septic shock ([place]) on their problem list.  Significant Hospital Events   7/7 > admit.  Consults:  None.  Procedures:  CVL 7/7 >   Significant Diagnostic Tests:  CT head 7/7 > neg. CT A / P 7/7 >   Micro Data:  COVID 7/7 >  Blood 7/7 >  CSF 7/7 >  CSF HSV 7/7 >   Antimicrobials:  Vanc 7/7 >  Cefepime 7/7 >  Acyclovir 7/7 >   Interim history/subjective:  Feels slightly improved.  Still hypotensive on 5 of levo. Asking for fluids to drink.  Objective:  Blood pressure (!) 78/36, pulse (!) 106, temperature (!) 97.3 F (36.3 C), resp. rate 20, SpO2 100 %.         Intake/Output Summary (Last 24 hours) at 06/05/2020 1646 Last data filed at 06/05/2020 1618 Gross per 24 hour  Intake 1012.16 ml  Output --  Net 1012.16 ml   There were no vitals filed for this visit.  Examination: General: Adult female, in NAD. Neuro: A&O x 3, no deficits. HEENT: [place]/AT. Sclerae anicteric.  EOMI. Cardiovascular: RRR, no M/R/G.  Lungs: Respirations even and unlabored.  CTA bilaterally, No W/R/R.  Abdomen: BS x 4, soft, NT/ND.  Musculoskeletal: No gross deformities, no edema.  Skin: Intact, warm, no rashes.  Assessment & Plan:   Septic shock - with concern for meningitis given constellation of symptoms though neuro exam unremarkable.  She is s/p LP in ED. - Continue empiric abx and add acyclovir. - Follow up on cultures. - Consider empiric decadron, defer for now. - Continue levophed as needed for goal MAP > 65. - Assess CVP's. - Trend lactate.  AKI. AGMA - presumed 2/2 lactate. Hypomagnesemia - s/p repletion. - Continue fluids. - Follow BMP, lactate.  Mild anemia. - Transfuse for Hgb < 7.  Nausea with vomiting. - F/u on CT abd / pelv. - Zofran PRN.  Hx HLD. - Continue home ASA, CoQ10, niacin, zetia.  Hx mood disorder. - Continue home buspirone, fluoxetine.   Best Practice:  Diet: Heart healthy. Pain/Anxiety/Delirium protocol (if indicated): N/A. VAP protocol (if indicated): N/A. DVT prophylaxis: SCD's / Heparin. GI prophylaxis:  N/A. Glucose control: N/A. Mobility: Bedrest. Code Status: Full. Family Communication: None available. Disposition: ICU.  Labs   CBC: Recent Labs  Lab 06/05/20 1208  WBC 13.1*  NEUTROABS 10.9*  HGB 10.4*  HCT 32.6*  MCV 106.9*  PLT 433   Basic Metabolic Panel: Recent Labs  Lab 06/05/20 1208 06/05/20 1258  NA 143  --   K 3.8  --   CL 111  --   CO2 14*  --   GLUCOSE 93  --   BUN 14  --   CREATININE 1.66*  --   CALCIUM 7.7*  --   MG  --  1.3*   GFR: CrCl cannot be calculated (Unknown ideal  weight.). Recent Labs  Lab 06/05/20 1208 06/05/20 1258 06/05/20 1444  WBC 13.1*  --   --   LATICACIDVEN  --  9.2* 7.3*   Liver Function Tests: Recent Labs  Lab 06/05/20 1422  AST 46*  ALT 38  ALKPHOS 51  BILITOT 0.5  PROT 4.8*  ALBUMIN 2.5*   Recent Labs  Lab 06/05/20 1422  LIPASE 21   No results for input(s): AMMONIA in the last 168 hours. ABG No results found for: PHART, PCO2ART, PO2ART, HCO3, TCO2, ACIDBASEDEF, O2SAT  Coagulation Profile: No results for input(s): INR, PROTIME in the last 168 hours. Cardiac Enzymes: No results for input(s): CKTOTAL, CKMB, CKMBINDEX, TROPONINI in the last 168 hours. HbA1C: No results found for: HGBA1C CBG: No results for input(s): GLUCAP in the last 168 hours.  Review of Systems:   All negative; except for those that are bolded, which indicate positives.  Constitutional: weight loss, weight gain, night sweats, fevers, chills, fatigue, weakness.  HEENT: headaches, sore throat, sneezing, nasal congestion, post nasal drip, difficulty swallowing, tooth/dental problems, visual complaints, visual changes, ear aches. Neuro: difficulty with speech, weakness, numbness, ataxia, photophobia. CV:  chest pain, orthopnea, PND, swelling in lower extremities, dizziness, palpitations, syncope.  Resp: cough, hemoptysis, dyspnea, wheezing. GI: heartburn, indigestion, abdominal pain, nausea, vomiting, diarrhea, constipation, change in bowel habits, loss of appetite, hematemesis, melena, hematochezia.  GU: dysuria, change in color of urine, urgency or frequency, flank pain, hematuria. MSK: joint pain or swelling, decreased range of motion. Psych: change in mood or affect, depression, anxiety, suicidal ideations, homicidal ideations. Skin: rash, itching, bruising.   Past medical history  She,  has a past medical history of High cholesterol and Mood disorder ([place]).   Surgical History    Past Surgical History:  Procedure Laterality Date  .  COLECTOMY    . EUS N/A 11/29/2015   Procedure: LOWER ENDOSCOPIC ULTRASOUND (EUS);  Surgeon: Arta Silence, MD;  Location[facility] ENDOSCOPY;  Service: Endoscopy;  Laterality: N/A;  . FLEXIBLE SIGMOIDOSCOPY N/A 01/27/2018   Procedure: FLEXIBLE SIGMOIDOSCOPY;  Surgeon: Otis Brace, MD;  Location: [place];  Service: Gastroenterology;  Laterality: N/A;  . LEFT HEART CATH AND CORONARY ANGIOGRAPHY N/A 11/15/2018   Procedure: LEFT HEART CATH AND CORONARY ANGIOGRAPHY;  Surgeon: Troy Sine, MD;  Location: [place] CV LAB;  Service: Cardiovascular;  Laterality: N/A;  . POLYPECTOMY  02/19/2016     Social History   reports that she has never smoked. She has never used smokeless tobacco. She reports current alcohol use of about 2.0 standard drinks of alcohol per week. She reports that she does not use drugs.   Family history   Her family history includes Cancer in her father; Cancer (age of onset: 75) in her sister; Heart attack in her mother; Other (age of  onset: 10) in her brother.   Allergies No Known Allergies   Home meds  Prior to Admission medications   Medication Sig Start Date End Date Taking? Authorizing Provider  acetaminophen (TYLENOL) 500 MG tablet Take 500 mg by mouth every 6 (six) hours as needed (for pain.).    [provider]  Ascorbic Acid (VITAMIN C) 1000 MG tablet Take 1,000 mg by mouth every evening.    [provider]  aspirin EC 81 MG tablet Take 81 mg by mouth at bedtime.    [provider]  busPIRone (BUSPAR) 10 MG tablet Take 10 mg by mouth 2 (two) times daily.    [provider]  Cholecalciferol (VITAMIN D3) 75 MCG (3000 UT) TABS Take 3,000 Units by mouth daily.    [provider]  Coenzyme Q10 (CO Q10) 100 MG CAPS Take 100 mg by mouth every evening.     [provider]  FLUoxetine (PROZAC) 10 MG capsule Take 10 mg by mouth daily.    [provider]  ibuprofen (ADVIL,MOTRIN) 200 MG tablet Take 200  mg by mouth every 8 (eight) hours as needed (for pain.).    [provider]  Methylfol-Algae-B12-Acetylcyst (CEREFOLIN NAC) 6-90.314-2-600 MG TABS Take 1 tablet by mouth 2 (two) times daily.    [provider]  Multiple Vitamin (MULTIVITAMIN WITH MINERALS) TABS tablet Take 1 tablet by mouth every evening.    [provider]  niacin 500 MG tablet Take 500 mg by mouth 3 (three) times daily with meals.    [provider]  Omega-3 Fatty Acids (FISH OIL) 1000 MG CAPS Take 1,000 mg by mouth at bedtime.    [provider]  ondansetron (ZOFRAN ODT) 4 MG disintegrating tablet Take 1 tablet (4 mg total) by mouth every 8 (eight) hours as needed for nausea or vomiting. 05/07/20   Suzy Bouchard, PA-C  TURMERIC PO Take 448 mg by mouth every evening.    [provider]  vitamin B-12 (CYANOCOBALAMIN) 1000 MCG tablet Take 1,000 mcg by mouth daily.    [provider]  ZETIA 10 MG tablet Take 10 mg by mouth every evening.  10/19/15   [provider]    Critical care time: 45 min.    Montey Hora, [facility] 06/05/2020, 4:46 PM

## 2020-06-05 NOTE — Progress Notes (Signed)
CRITICAL VALUE ALERT  Critical Value:  Troponin 47  Date & Time Notied:  06/05/20 11:01 PM   Provider Notified: Lucile Shutters, MD  Orders Received/Actions taken: awaiting orders

## 2020-06-05 NOTE — Progress Notes (Signed)
eLink Physician-Brief Progress Note Patient Name: Alexandria Wallace DOB: 09/06/1941 MRN: [id number]   Date of Service  06/05/2020  HPI/Events of Note  Elevated Troponin-Acute Coronary Syndrome vs demand, Cardiac cath in 2019 did not show significant coronary artery disease.  eICU Interventions  Heparin + Aspirin, cycle enzymes, interval EKG, anti-lipid therapy, continue Rx for septic shock.        Kerry Kass Jacquez Sheetz 06/05/2020, 11:31 PM

## 2020-06-05 NOTE — Progress Notes (Signed)
ANTICOAGULATION CONSULT NOTE - Initial Consult  Pharmacy Consult for Heparin Indication: chest pain/ACS  No Known Allergies  Patient Measurements:    Ht 64 in Wt 68 kg   Vital Signs: Temp: 98.3 F (36.8 C) (07/07 2334) Temp Source: Oral (07/07 2334) BP: 114/52 (07/07 2145) Pulse Rate: 117 (07/07 2145)  Labs: Recent Labs    06/05/20 1208 06/05/20 1258 06/05/20 1444  HGB 10.4*  --   --   HCT 32.6*  --   --   PLT 214  --   --   CREATININE 1.66*  --   --   TROPONINIHS  --  14 47*    CrCl cannot be calculated (Unknown ideal weight.).   Medical History: Past Medical History:  Diagnosis Date  . High cholesterol   . Mood disorder (H[facility])     Medications:  Medications Prior to Admission  Medication Sig Dispense Refill Last Dose  . acetaminophen (TYLENOL) 500 MG tablet Take 500 mg by mouth every 6 (six) hours as needed (for pain.).   Past Week at Unknown time  . Ascorbic Acid (VITAMIN C) 1000 MG tablet Take 1,000 mg by mouth every evening.   Past Week at Unknown time  . busPIRone (BUSPAR) 10 MG tablet Take 10 mg by mouth daily.    06/04/2020 at Unknown time  . Cholecalciferol (VITAMIN D3) 75 MCG (3000 UT) TABS Take 3,000 Units by mouth daily.   Past Week at Unknown time  . Coenzyme Q10 (CO Q10) 100 MG CAPS Take 100 mg by mouth every evening.    06/04/2020 at Unknown time  . FLUoxetine (PROZAC) 10 MG capsule Take 10 mg by mouth daily.   06/04/2020 at Unknown time  . ibuprofen (ADVIL,MOTRIN) 200 MG tablet Take 200 mg by mouth every 8 (eight) hours as needed (for pain.).   Past Week at Unknown time  . Methylfol-Algae-B12-Acetylcyst (CEREFOLIN NAC) 6-90.314-2-600 MG TABS Take 1 tablet by mouth 2 (two) times daily.   06/04/2020 at Unknown time  . niacin 500 MG tablet Take 500 mg by mouth 3 (three) times daily with meals.   06/04/2020 at Unknown time  . Omega-3 Fatty Acids (FISH OIL) 1000 MG CAPS Take 1,000 mg by mouth at bedtime.   Past Week at Unknown time  . ondansetron (ZOFRAN ODT) 4  MG disintegrating tablet Take 1 tablet (4 mg total) by mouth every 8 (eight) hours as needed for nausea or vomiting. 20 tablet 0 06/05/2020 at Unknown time  . TURMERIC PO Take 448 mg by mouth every evening.   06/04/2020 at Unknown time  . vitamin B-12 (CYANOCOBALAMIN) 1000 MCG tablet Take 1,000 mcg by mouth daily.   06/04/2020 at Unknown time    Assessment: 79 y.o. F presented with possible meningitis. Pt with elevated troponin, to start heparin for NSTEMI. CBC ok on admission. No AC PTA.  Goal of Therapy:  Heparin level 0.3-0.7 units/ml Monitor platelets by anticoagulation protocol: Yes   Plan:  Heparin IV bolus 4000 units Heparin gtt at 850 units/hr Will f/u heparin level in 8 hours Daily heparin level and CBC  Sherlon Handing, PharmD, BCPS Please see amion for complete clinical pharmacist phone list 06/05/2020,11:57 PM

## 2020-06-05 NOTE — Progress Notes (Signed)
Pharmacy Antibiotic Note  Alexandria Wallace is a 79 y.o. female admitted on 06/05/2020 with r/o meningitis.  Pharmacy has been consulted for Cefepime, vancomycin and Acyclovir dosing. WBC 13.1. SCr 1.66.   Plan: -Start Cefepime 2 gm IV Q 12 hours -Vancomycin 1250 mg IV once then start vancomycin 1 gm IV Q 24 hours -Acyclovir 10 mg/kg IBW (500 mg) Q 12 hours -Monitor CBC, renal fx, LP and cultures -VT as indicated      Temp (24hrs), Avg:97.3 F (36.3 C), Min:97.3 F (36.3 C), Max:97.3 F (36.3 C)  Recent Labs  Lab 06/05/20 1208 06/05/20 1258 06/05/20 1444  WBC 13.1*  --   --   CREATININE 1.66*  --   --   LATICACIDVEN  --  9.2* 7.3*    CrCl cannot be calculated (Unknown ideal weight.).    No Known Allergies  Antimicrobials this admission: Vanc 7/7 >>  Cefepime 7/7 >>   Dose adjustments this admission:  Microbiology results:   Thank you for allowing pharmacy to be a part of this patient's care.  Albertina Parr, PharmD., BCPS, BCCCP Clinical Pharmacist Clinical phone for 06/05/20 until 11:30pm: [phone] If after 11:30pm, please refer to [facility] for unit-specific pharmacist

## 2020-06-05 NOTE — ED Provider Notes (Signed)
Alexandria Wallace Provider Note   CSN: [id number] Arrival date & time: 06/05/20  1129     History Chief Complaint  Patient presents with  . Hypotension  . Nausea    Alexandria Wallace is a 79 y.o. female.  HPI   79yF with n/v and headache. Woke up this morning with n/v. Went to bed in her usual state of health. Vomited multiple times. Non-bloody. Later developed a posterior headache. Associated photophobia. Denies any pain elsewhere. No abdominal or CP. No dyspnea. No fever or chills. Denies significant headache history. No diarrhea. No sick contacts that she is aware of.   Past Medical History:  Diagnosis Date  . High cholesterol   . Mood disorder [facility])     Patient Active Problem List   Diagnosis Date Noted  . Dyspnea   . 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 Murmur 11/11/2016  . Mixed hyperlipidemia 11/11/2016    Past Surgical History:  Procedure Laterality Date  . COLECTOMY    . EUS N/A 11/29/2015   Procedure: LOWER ENDOSCOPIC ULTRASOUND (EUS);  Surgeon: Arta Silence, MD;  Location: [facility] ENDOSCOPY;  Service: Endoscopy;  Laterality: N/A;  . FLEXIBLE SIGMOIDOSCOPY N/A 01/27/2018   Procedure: FLEXIBLE SIGMOIDOSCOPY;  Surgeon: Otis Brace, MD;  Location: [place];  Service: Gastroenterology;  Laterality: N/A;  . LEFT HEART CATH AND CORONARY ANGIOGRAPHY N/A 11/15/2018   Procedure: LEFT HEART CATH AND CORONARY ANGIOGRAPHY;  Surgeon: Troy Sine, MD;  Location: [place] CV LAB;  Service: Cardiovascular;  Laterality: N/A;  . POLYPECTOMY  02/19/2016     OB History   No obstetric history on file.     Family History  Problem Relation Age of Onset  . Heart attack Mother   . Cancer Father   . Cancer Sister 44  . Other Brother 10       meningitis     Social History   Tobacco Use  . Smoking status: Never Smoker  . Smokeless tobacco: Never Used  Substance Use Topics  . Alcohol use: Yes     Alcohol/week: 2.0 standard drinks    Types: 2 Glasses of wine per week  . Drug use: No    Home Medications Prior to Admission medications   Medication Sig Start Date End Date Taking? Authorizing Provider  acetaminophen (TYLENOL) 500 MG tablet Take 500 mg by mouth every 6 (six) hours as needed (for pain.).    [provider]  Ascorbic Acid (VITAMIN C) 1000 MG tablet Take 1,000 mg by mouth every evening.    [provider]  aspirin EC 81 MG tablet Take 81 mg by mouth at bedtime.    [provider]  busPIRone (BUSPAR) 10 MG tablet Take 10 mg by mouth 2 (two) times daily.    [provider]  Cholecalciferol (VITAMIN D3) 75 MCG (3000 UT) TABS Take 3,000 Units by mouth daily.    [provider]  Coenzyme Q10 (CO Q10) 100 MG CAPS Take 100 mg by mouth every evening.     [provider]  FLUoxetine (PROZAC) 10 MG capsule Take 10 mg by mouth daily.    [provider]  ibuprofen (ADVIL,MOTRIN) 200 MG tablet Take 200 mg by mouth every 8 (eight) hours as needed (for pain.).    [provider]  Methylfol-Algae-B12-Acetylcyst (CEREFOLIN NAC) 6-90.314-2-600 MG TABS Take 1 tablet by mouth 2 (two) times daily.    [provider]  Multiple Vitamin (  MULTIVITAMIN WITH MINERALS) TABS tablet Take 1 tablet by mouth every evening.    [provider]  niacin 500 MG tablet Take 500 mg by mouth 3 (three) times daily with meals.    [provider]  Omega-3 Fatty Acids (FISH OIL) 1000 MG CAPS Take 1,000 mg by mouth at bedtime.    [provider]  ondansetron (ZOFRAN ODT) 4 MG disintegrating tablet Take 1 tablet (4 mg total) by mouth every 8 (eight) hours as needed for nausea or vomiting. 05/07/20   Suzy Bouchard, PA-C  TURMERIC PO Take 448 mg by mouth every evening.    [provider]  vitamin B-12 (CYANOCOBALAMIN) 1000 MCG tablet Take 1,000 mcg by mouth daily.    [provider]  ZETIA 10 MG  tablet Take 10 mg by mouth every evening.  10/19/15   [provider]    Allergies    Patient has no known allergies.  Review of Systems   Review of Systems All systems reviewed and negative, other than as noted in HPI.  Physical Exam Updated Vital Signs BP (!) 55/47   Pulse 87   Temp (!) 97.3 F (36.3 C)   Resp 14   SpO2 100%   Physical Exam Vitals and nursing note reviewed.  Constitutional:      Appearance: She is well-developed.     Comments: Laying in bed. Appears tired but not toxic.   HENT:     Head: Normocephalic and atraumatic.  Eyes:     General:        Right eye: No discharge.        Left eye: No discharge.     Conjunctiva/sclera: Conjunctivae normal.  Neck:     Comments: No nuchal rigidity Cardiovascular:     Rate and Rhythm: Normal rate and regular rhythm.     Heart sounds: Normal heart sounds. No murmur heard.  No friction rub. No gallop.   Pulmonary:     Effort: Pulmonary effort is normal. No respiratory distress.     Breath sounds: Normal breath sounds.  Abdominal:     General: There is no distension.     Palpations: Abdomen is soft.     Tenderness: There is no abdominal tenderness.  Musculoskeletal:        General: No tenderness.     Cervical back: Neck supple. No rigidity.  Skin:    General: Skin is warm and dry.  Neurological:     General: No focal deficit present.     Mental Status: She is alert and oriented to person, place, and time.     Cranial Nerves: No cranial nerve deficit.     Sensory: No sensory deficit.     Motor: No weakness.  Psychiatric:        Behavior: Behavior normal.        Thought Content: Thought content normal.     ED Results / Procedures / Treatments   Labs (all labs ordered are listed, but only abnormal results are displayed) Labs Reviewed  CBC WITH DIFFERENTIAL/PLATELET - Abnormal; Notable for the following components:      Result Value   WBC 13.1 (*)    RBC 3.05 (*)    Hemoglobin 10.4 (*)    HCT  32.6 (*)    MCV 106.9 (*)    MCH 34.1 (*)    Neutro Abs 10.9 (*)    All other components within normal limits  BASIC METABOLIC PANEL - Abnormal; Notable for the following  components:   CO2 14 (*)    Creatinine, Ser 1.66 (*)    Calcium 7.7 (*)    GFR calc non Af Amer 29 (*)    GFR calc Af Amer 34 (*)    Anion gap 18 (*)    All other components within normal limits  LACTIC ACID, PLASMA - Abnormal; Notable for the following components:   Lactic Acid, Venous 9.2 (*)    All other components within normal limits  LACTIC ACID, PLASMA - Abnormal; Notable for the following components:   Lactic Acid, Venous 7.3 (*)    All other components within normal limits  MAGNESIUM - Abnormal; Notable for the following components:   Magnesium 1.3 (*)    All other components within normal limits  HEPATIC FUNCTION PANEL - Abnormal; Notable for the following components:   Total Protein 4.8 (*)    Albumin 2.5 (*)    AST 46 (*)    All other components within normal limits  TROPONIN I (HIGH SENSITIVITY) - Abnormal; Notable for the following components:   Troponin I (High Sensitivity) 47 (*)    All other components within normal limits  CULTURE, BLOOD (ROUTINE X 2)  CULTURE, BLOOD (ROUTINE X 2)  SARS CORONAVIRUS 2 BY RT PCR (HOSPITAL ORDER, [place] [facility])  CSF CULTURE  GRAM STAIN  LIPASE, BLOOD  URINALYSIS, ROUTINE W REFLEX MICROSCOPIC  OCCULT BLOOD X 1 CARD TO LAB, STOOL  CSF CELL COUNT WITH DIFFERENTIAL  CSF CELL COUNT WITH DIFFERENTIAL  GLUCOSE, CSF  PROTEIN, CSF  HERPES SIMPLEX VIRUS(HSV) DNA BY PCR  TYPE AND SCREEN  ABO/RH  TROPONIN I (HIGH SENSITIVITY)    EKG EKG Interpretation  Date/Time:  Wednesday June 05 2020 11:43:42 EDT Ventricular Rate:  95 PR Interval:  140 QRS Duration: 84 QT Interval:  382 QTC Calculation: 480 R Axis:   84 Text Interpretation: Normal sinus rhythm Biatrial enlargement Nonspecific ST abnormality Abnormal ECG Confirmed by Virgel Manifold [phone]) on 06/05/2020 12:16:16 PM   Radiology CT Head Wo Contrast  Result Date: 06/05/2020 CLINICAL DATA:  Headache and nausea. EXAM: CT HEAD WITHOUT CONTRAST TECHNIQUE: Contiguous axial images were obtained from the base of the skull through the vertex without intravenous contrast. COMPARISON:  None. FINDINGS: Brain: The ventricles are normal in size and configuration for age. No extra-axial fluid collections are identified. The gray-white differentiation is maintained. No CT findings for acute hemispheric infarction or intracranial hemorrhage. No mass lesions. The brainstem and cerebellum are normal. Vascular: Scattered vascular calcifications. No hyperdense vessels or obvious aneurysm. Skull: No acute skull fracture.  No bone lesion. Sinuses/Orbits: The paranasal sinuses and mastoid air cells are clear. The globes are intact. Other: No scalp lesions, laceration or hematoma. IMPRESSION: No acute intracranial findings or mass lesions. Electronically Signed   By: Marijo Sanes M.D.   On: 06/05/2020 13:15   DG Chest Portable 1 View  Result Date: 06/05/2020 CLINICAL DATA:  Central line placement, hypertension EXAM: PORTABLE CHEST 1 VIEW COMPARISON:  06/05/2020 at 1:20 p.m. FINDINGS: Right internal jugular central venous catheter has been placed with its tip overlying the expected superior vena cava. Lungs are clear. No pneumothorax or pleural effusion. Cardiac size within normal limits. Pulmonary vascularity is normal. IMPRESSION: Right internal jugular central venous catheter tip within the superior vena cava. No pneumothorax. Electronically Signed   By: Fidela Salisbury MD   On: 06/05/2020 15:07   DG Chest Portable 1 View  Result Date: 06/05/2020 CLINICAL DATA:  Abdominal pain and cramping. Hypotension. Possible sepsis. EXAM: PORTABLE CHEST 1 VIEW COMPARISON:  None. FINDINGS: Lungs are clear. Heart size is normal. Aortic atherosclerosis. No pneumothorax or pleural fluid. No acute or focal bony  abnormality. IMPRESSION: No acute disease. Aortic Atherosclerosis (ICD10-I70.0). Electronically Signed   By: Inge Rise M.D.   On: 06/05/2020 13:48    Procedures .Lumbar Puncture  Date/Time: 06/05/2020 3:45 PM Performed by: Virgel Manifold, MD Authorized by: Virgel Manifold, MD   Consent:    Consent obtained:  Emergent situation   Consent given by:  Patient   Risks discussed:  Bleeding, infection, pain and headache Pre-procedure details:    Procedure purpose:  Diagnostic   Preparation: Patient was prepped and draped in usual sterile fashion   Anesthesia (see MAR for exact dosages):    Anesthesia method:  Local infiltration   Local anesthetic:  Lidocaine 1% w/o epi Procedure details:    Lumbar space:  L3-L4 interspace   Patient position:  L lateral decubitus   Needle gauge:  20   Needle type:  Spinal needle - Quincke tip   Needle length (in):  3.5   Ultrasound guidance: no     Number of attempts:  1   Fluid appearance:  Clear   Tubes of fluid:  4   Total volume (ml):  5 Post-procedure:    Puncture site:  Adhesive bandage applied   Patient tolerance of procedure:  Tolerated well, no immediate complications   CENTRAL LINE Performed by: Virgel Manifold Consent: The procedure was performed in an emergent situation. Required items: required blood products, implants, devices, and special equipment available Patient identity confirmed: arm band and provided demographic data Time out: Immediately prior to procedure a "time out" was called to verify the correct patient, procedure, equipment, support staff and site/side marked as required. Indications: vascular access Anesthesia: local infiltration Local anesthetic: lidocaine 1% w/o epinephrine Anesthetic total: 3 ml Patient sedated: no Preparation: skin prepped with 2% chlorhexidine Skin prep agent dried: skin prep agent completely dried prior to procedure Sterile barriers: all five maximum sterile barriers used - cap, mask,  sterile gown, sterile gloves, and large sterile sheet Hand hygiene: hand hygiene performed prior to central venous catheter insertion  Location details: R IJ  Catheter type: triple lumen Catheter size: 8 Fr Pre-procedure: landmarks identified Ultrasound guidance: yes Successful placement: yes Post-procedure: line sutured and dressing applied Assessment: blood return through all parts, free fluid flow, placement verified by x-ray and no pneumothorax on x-ray Patient tolerance: Patient tolerated the procedure well with no immediate complications.  CRITICAL CARE Performed by: Virgel Manifold Total critical care time: 75 minutes Critical care time was exclusive of separately billable procedures and treating other patients. Critical care was necessary to treat or prevent imminent or life-threatening deterioration. Critical care was time spent personally by me on the following activities: development of treatment plan with patient and/or surrogate as well as nursing, discussions with consultants, evaluation of patient's response to treatment, examination of patient, obtaining history from patient or surrogate, ordering and performing treatments and interventions, ordering and review of laboratory studies, ordering and review of radiographic studies, pulse oximetry and re-evaluation of patient's condition.   (including critical care time)  Medications Ordered in ED Medications  ceFEPIme (MAXIPIME) 2 g in sodium chloride 0.9 % 100 mL IVPB (has no administration in time range)  vancomycin (VANCOREADY) IVPB 1250 mg/250 mL (has no administration in time range)  norepinephrine (LEVOPHED) 4-5 MG/250ML-% infusion SOLN (has no administration in time range)  norepinephrine (LEVOPHED) 4mg  in 290mL premix infusion (has no administration in time range)  lactated ringers bolus 1,000 mL (has no administration in time range)  magnesium sulfate IVPB 2 g 50 mL (has no administration in time range)  sodium  chloride 0.9 % bolus 1,000 mL (1,000 mLs Intravenous New Bag/Given 06/05/20 1159)  sodium chloride 0.9 % bolus 1,000 mL (0 mLs Intravenous Stopped 06/05/20 1215)  sodium chloride 0.9 % bolus 1,000 mL (1,000 mLs Intravenous New Bag/Given 06/05/20 1300)    ED Course  I have reviewed the triage vital signs and the nursing notes.  Pertinent labs & imaging results that were available during my care of the patient were reviewed by me and considered in my medical decision making (see chart for details).    MDM Rules/Calculators/A&P                          ,12:31 PM Still hypotensive. Persistent c/o HA. CT tech in room to take her over to scanner. Continues to say she feels "not good" but vague on specifics aside from headache. Not evening complaining of nausea currently. Additional studies ordered. More IVF.   1:33 PM BP not improved after 3L of IVF at this point. Still not sure of etiology. Empiric abx ordered for possible sepsis. Will place central line.   2:33 PM Central line placed. Start pressors. Lactic acid 9 and this was drawn after ~2L of IVF. If sepsis, I still don't have a clear source. UA still pending. Main complaint is headache. Doesn't have nuchal rigidity but needs LP to evaluate. Will also CT a/p for possible source although her abdominal exam is benign.    Final Clinical Impression(s) / ED Diagnoses Final diagnoses:  Septic shock [facility]    Rx / DC Orders ED Discharge Orders    None       Virgel Manifold, MD 06/06/20 [phone]

## 2020-06-06 ENCOUNTER — Inpatient Hospital Stay (HOSPITAL_COMMUNITY): Payer: Medicare PPO

## 2020-06-06 DIAGNOSIS — A419 Sepsis, unspecified organism: Secondary | ICD-10-CM

## 2020-06-06 DIAGNOSIS — R112 Nausea with vomiting, unspecified: Secondary | ICD-10-CM

## 2020-06-06 DIAGNOSIS — R778 Other specified abnormalities of plasma proteins: Secondary | ICD-10-CM

## 2020-06-06 DIAGNOSIS — I361 Nonrheumatic tricuspid (valve) insufficiency: Secondary | ICD-10-CM

## 2020-06-06 DIAGNOSIS — R110 Nausea: Secondary | ICD-10-CM

## 2020-06-06 DIAGNOSIS — R7989 Other specified abnormal findings of blood chemistry: Secondary | ICD-10-CM

## 2020-06-06 LAB — CBC
HCT: 33.9 % — ABNORMAL LOW (ref 36.0–46.0)
Hemoglobin: 11.3 g/dL — ABNORMAL LOW (ref 12.0–15.0)
MCH: 34.3 pg — ABNORMAL HIGH (ref 26.0–34.0)
MCHC: 33.3 g/dL (ref 30.0–36.0)
MCV: 103 fL — ABNORMAL HIGH (ref 80.0–100.0)
Platelets: 272 10*3/uL (ref 150–400)
RBC: 3.29 MIL/uL — ABNORMAL LOW (ref 3.87–5.11)
RDW: 13.9 % (ref 11.5–15.5)
WBC: 20.3 10*3/uL — ABNORMAL HIGH (ref 4.0–10.5)
nRBC: 0 % (ref 0.0–0.2)

## 2020-06-06 LAB — BASIC METABOLIC PANEL
Anion gap: 13 (ref 5–15)
BUN: 20 mg/dL (ref 8–23)
CO2: 17 mmol/L — ABNORMAL LOW (ref 22–32)
Calcium: 7.7 mg/dL — ABNORMAL LOW (ref 8.9–10.3)
Chloride: 110 mmol/L (ref 98–111)
Creatinine, Ser: 1.27 mg/dL — ABNORMAL HIGH (ref 0.44–1.00)
GFR calc Af Amer: 47 mL/min — ABNORMAL LOW (ref >60)
GFR calc non Af Amer: 41 mL/min — ABNORMAL LOW (ref >60)
Glucose, Bld: 223 mg/dL — ABNORMAL HIGH (ref 70–99)
Potassium: 4.4 mmol/L (ref 3.5–5.1)
Sodium: 140 mmol/L (ref 135–145)

## 2020-06-06 LAB — PROCALCITONIN: Procalcitonin: 0.53 ng/mL

## 2020-06-06 LAB — HSV DNA BY PCR (REFERENCE LAB)
HSV 1 DNA: NEGATIVE
HSV 2 DNA: NEGATIVE

## 2020-06-06 LAB — LACTIC ACID, PLASMA
Lactic Acid, Venous: 2.8 mmol/L (ref 0.5–1.9)
Lactic Acid, Venous: 2.1 mmol/L (ref 0.5–1.9)

## 2020-06-06 LAB — TROPONIN I (HIGH SENSITIVITY)
Troponin I (High Sensitivity): 1135 ng/L (ref <18)
Troponin I (High Sensitivity): 1391 ng/L (ref <18)
Troponin I (High Sensitivity): 1532 ng/L (ref <18)

## 2020-06-06 LAB — ECHOCARDIOGRAM COMPLETE: Weight: 2373.91 oz

## 2020-06-06 LAB — GLUCOSE, CAPILLARY
Glucose-Capillary: 172 mg/dL — ABNORMAL HIGH (ref 70–99)
Glucose-Capillary: 173 mg/dL — ABNORMAL HIGH (ref 70–99)
Glucose-Capillary: 155 mg/dL — ABNORMAL HIGH (ref 70–99)
Glucose-Capillary: 103 mg/dL — ABNORMAL HIGH (ref 70–99)

## 2020-06-06 LAB — MAGNESIUM: Magnesium: 1.8 mg/dL (ref 1.7–2.4)

## 2020-06-06 LAB — HEMOGLOBIN A1C
Hgb A1c MFr Bld: 5.9 % — ABNORMAL HIGH (ref 4.8–5.6)
Mean Plasma Glucose: 122.63 mg/dL

## 2020-06-06 LAB — HEPARIN LEVEL (UNFRACTIONATED): Heparin Unfractionated: 0.6 IU/mL (ref 0.30–0.70)

## 2020-06-06 LAB — CORTISOL: Cortisol, Plasma: 27.6 ug/dL

## 2020-06-06 LAB — PHOSPHORUS: Phosphorus: 3 mg/dL (ref 2.5–4.6)

## 2020-06-06 MED ORDER — LACTATED RINGERS IV BOLUS
500.0000 mL | Freq: Once | INTRAVENOUS | Status: AC
  Administered 2020-06-06: 500 mL via INTRAVENOUS

## 2020-06-06 MED ORDER — INSULIN ASPART 100 UNIT/ML ~~LOC~~ SOLN
0.0000 [IU] | SUBCUTANEOUS | Status: DC
  Administered 2020-06-06 (×3): 3 [IU] via SUBCUTANEOUS
  Administered 2020-06-07: 2 [IU] via SUBCUTANEOUS
  Administered 2020-06-07: 3 [IU] via SUBCUTANEOUS
  Administered 2020-06-07: 2 [IU] via SUBCUTANEOUS
  Administered 2020-06-08: 3 [IU] via SUBCUTANEOUS

## 2020-06-06 MED ORDER — HEPARIN (PORCINE) 25000 UT/250ML-% IV SOLN
850.0000 [IU]/h | INTRAVENOUS | Status: DC
  Administered 2020-06-06: 850 [IU]/h via INTRAVENOUS
  Filled 2020-06-06: qty 250

## 2020-06-06 MED ORDER — SODIUM CHLORIDE 0.9% FLUSH: 10.0000 mL | INTRAVENOUS | Status: DC | PRN

## 2020-06-06 MED ORDER — CHLORHEXIDINE GLUCONATE CLOTH 2 % EX PADS
6.0000 | MEDICATED_PAD | Freq: Every day | CUTANEOUS | Status: DC
  Administered 2020-06-06 – 2020-06-07 (×2): 6 via TOPICAL

## 2020-06-06 MED ORDER — METOCLOPRAMIDE HCL 5 MG/ML IJ SOLN
5.0000 mg | Freq: Three times a day (TID) | INTRAMUSCULAR | Status: DC
  Administered 2020-06-06 – 2020-06-08 (×8): 5 mg via INTRAVENOUS
  Filled 2020-06-06 (×8): qty 2

## 2020-06-06 MED ORDER — ALUM & MAG HYDROXIDE-SIMETH 200-200-20 MG/5ML PO SUSP
15.0000 mL | ORAL | Status: DC | PRN
  Administered 2020-06-06: 15 mL via ORAL
  Filled 2020-06-06: qty 30

## 2020-06-06 MED ORDER — HEPARIN BOLUS VIA INFUSION
4000.0000 [IU] | Freq: Once | INTRAVENOUS | Status: AC
  Administered 2020-06-06: 4000 [IU] via INTRAVENOUS
  Filled 2020-06-06: qty 4000

## 2020-06-06 MED ORDER — SODIUM CHLORIDE 0.9% FLUSH
10.0000 mL | Freq: Two times a day (BID) | INTRAVENOUS | Status: DC
  Administered 2020-06-06 – 2020-06-07 (×3): 10 mL

## 2020-06-06 NOTE — Progress Notes (Signed)
  Echocardiogram 2D Echocardiogram has been performed.  Alexandria Wallace G Keilen Kahl 06/06/2020, 3:59 PM

## 2020-06-06 NOTE — Progress Notes (Signed)
eLink Physician-Brief Progress Note Patient Name: NITI LEISURE DOB: July 21, 1941 MRN: [id number]   Date of Service  06/06/2020  HPI/Events of Note  Patient asking for PRN medications for indigestion.  eICU Interventions  PRN Mylanta ordered for indigestion.        Kerry Kass Shamyia Grandpre 06/06/2020, 4:16 AM

## 2020-06-06 NOTE — Progress Notes (Signed)
Per the patient, she believes the niacin tablet is what contributes to her N/V so she has refused the niacin all day for me.   Dewaine Oats, RN

## 2020-06-06 NOTE — Progress Notes (Signed)
NAME:  Alexandria Wallace, MRN:  [id number], DOB:  08-08-1941, LOS: 1 ADMISSION DATE:  06/05/2020, CONSULTATION DATE:  06/05/20 REFERRING MD:  Wilson Singer  CHIEF COMPLAINT:  Headache, nausea, vomiting   Brief History   Alexandria Wallace is a 79 y.o. female who was admitted 7/7 with septic shock and concern for meningitis.  History of present illness   Alexandria Wallace is a 79 y.o. female who has a PMH including but not limited to mood disorder, HLD (see "past medical history" for rest).  She presented to [facility] ED 7/7 with nausea, vomiting, headache.  She was in her usual state of health when she went to bed the night prior.  After waking up and experiencing symptoms, she later developed posterior headache with photophobia.  Denies any lightheadedness, nuchal rigidity, syncope, fevers/chills/sweats, chest pain, dyspnea, myalgias, rashes, exposures to known sick contacts.  In ED, she was hypotensive and somewhat altered.  Head CT was negative.  Due to constellation of symptoms, LP was performed in ED and she was started on empiric vanc / cefepime.  Due to persistent hypotension, CVL was placed and she was started on norepinephrine.  CT abd / pelv also ordered to r/o intraabdominal process.  Past Medical History  has Murmur; Mixed hyperlipidemia; Dyspnea; Agatston coronary artery calcium score between 100 and 199; Septic shock ([place]); AKI (acute kidney injury) ([place]); and Lactic acidosis on their problem list.  Significant Hospital Events   7/7 > admit.  Consults:  None.  Procedures:  CVL 7/7 >   Significant Diagnostic Tests:  CT head 7/7 > neg. CT A / P 7/7 > neg. Echo 7/8 >   Micro Data:  COVID 7/7 > neg. Blood 7/7 >  CSF 7/7 >  CSF HSV 7/7 >   Antimicrobials:  Vanc 7/7 > 7/8. Cefepime 7/7 >  Acyclovir 7/7 >   Interim history/subjective:  Feels a little better.  Remains on levo at 8. Checking BP in contralateral arm.  Still having some nausea and vomiting, feels unable to keep  anything down.  CT abd / pelv unrevealing.  Objective:  Blood pressure (!) 93/51, pulse 92, temperature 98.8 F (37.1 C), temperature source Oral, resp. rate 13, weight 67.3 kg, SpO2 100 %. CVP:  [9 mmHg-11 mmHg] 9 mmHg      Intake/Output Summary (Last 24 hours) at 06/06/2020 0938 Last data filed at 06/06/2020 0700 Gross per 24 hour  Intake 3825.21 ml  Output 225 ml  Net 3600.21 ml   Filed Weights   06/06/20 0428  Weight: 67.3 kg    Examination: General: Adult female, in NAD. Neuro: A&O x 3, no deficits. HEENT: [place]/AT. Sclerae anicteric.  EOMI. Cardiovascular: RRR, no M/R/G.  Lungs: Respirations even and unlabored.  CTA bilaterally, No W/R/R.  Abdomen: BS x 4, soft, NT/ND.  Musculoskeletal: No gross deformities, no edema.  Skin: Intact, warm, no rashes.  Assessment & Plan:   Shock - unclear etiology, on presentation there was initial concern for septic 2/2 meningitis; however, I highly doubt this to be the case (Bacterial meningitis at least. Viral / HSV still possible but also doubtful given CSF composition).  CSF unrevealing and cultures and gram stain still pending.  Favor hypovolemic shock at this point. - Continue cefepime for now but d/c vanc. - Continue acyclovir empirically until HSV results. - Follow cultures through completion. - Add PCT for completeness and to help guide therapy. - Continue levophed as needed but try to wean to off.  Will  change goal MAP from 65 to 55 given that it appears as if her BP normally runs on the low side. - Assess cortisol. - Follow CVP's, goal 10 - 12.  AKI - gradually improving. AGMA - presumed 2/2 lactate. Hypomagnesemia - s/p repletion. - Continue fluids. - Follow BMP.  Troponin bump - suspect demand ischemia.  She remains asymptomatic. - Continue empiric heparin for now. - Repeat troponin now. - Assess echo for completeness and given ongoing soft pressures.  Hyperglycemia - no hx DM. - Start SSI. - Assess Hgb A1c.  Nausea  with vomiting - CT unrevealing.  Slightly improved with zofran but still feels she is unable to keep anything down.  ? Hypomotility / gastroparesis. - Continue zofran. - Add reglan.  Mild anemia. - Transfuse for Hgb < 7.  Hx HLD. - Continue home ASA, CoQ10, niacin, zetia.  Hx mood disorder. - Continue home buspirone, fluoxetine.  Best Practice:  Diet: Heart healthy. Pain/Anxiety/Delirium protocol (if indicated): N/A. VAP protocol (if indicated): N/A. DVT prophylaxis: SCD's / Heparin. GI prophylaxis: N/A. Glucose control: N/A. Mobility: Bedrest. Code Status: Full. Family Communication: None available. Disposition: ICU.   Critical care time: 35 min.    Montey Hora, [facility] 06/06/2020, 9:38 AM

## 2020-06-06 NOTE — Progress Notes (Signed)
[place] for Heparin Indication: chest pain/ACS  No Known Allergies  Patient Measurements: Weight: 67.3 kg (148 lb 5.9 oz)  Ht 64 in Wt 68 kg  Vital Signs: Temp: 98.8 F (37.1 C) (07/08 0300) Temp Source: Oral (07/08 0300) BP: 93/51 (07/08 0900) Pulse Rate: 92 (07/08 0900)  Labs: Recent Labs    06/05/20 1208 06/05/20 1258 06/05/20 1444 06/06/20 0032 06/06/20 0228 06/06/20 0429 06/06/20 0804  HGB 10.4*  --   --   --   --  11.3*  --   HCT 32.6*  --   --   --   --  33.9*  --   PLT 214  --   --   --   --  272  --   HEPARINUNFRC  --   --   --   --   --   --  0.60  CREATININE 1.66*  --   --   --  1.27*  --   --   TROPONINIHS  --    < > 47* 1,135* 1,391*  --   --    < > = values in this interval not displayed.    Estimated Creatinine Clearance: 35 mL/min (A) (by C-G formula based on SCr of 1.27 mg/dL (H)).   Assessment: 79 y.o. F presented with possible meningitis. Patient with elevated troponin to continue on IV heparin for NSTEMI.  Heparin level is therapeutic; no bleeding reported.  Goal of Therapy:  Heparin level 0.3-0.7 units/ml Monitor platelets by anticoagulation protocol: Yes   Plan:  Continue heparin gtt at 850 units/hr Check confirmatory heparin level Daily heparin level and CBC  Eleri Ruben D. Mina Marble, PharmD, BCPS, [place] 06/06/2020, 9:28 AM

## 2020-06-06 NOTE — Progress Notes (Signed)
eLink Physician-Brief Progress Note Patient Name: Alexandria Wallace DOB: 24-Apr-1941 MRN: [id number]   Date of Service  06/06/2020  HPI/Events of Note  Patient with elevated Troponin with benign  EKG, likely demand ischemia.  eICU Interventions  No new interventions.        Kerry Kass Milanya Sunderland 06/06/2020, 2:44 AM

## 2020-06-07 DIAGNOSIS — E872 Acidosis: Secondary | ICD-10-CM

## 2020-06-07 DIAGNOSIS — I959 Hypotension, unspecified: Secondary | ICD-10-CM

## 2020-06-07 LAB — GLUCOSE, CAPILLARY
Glucose-Capillary: 103 mg/dL — ABNORMAL HIGH (ref 70–99)
Glucose-Capillary: 124 mg/dL — ABNORMAL HIGH (ref 70–99)
Glucose-Capillary: 142 mg/dL — ABNORMAL HIGH (ref 70–99)
Glucose-Capillary: 150 mg/dL — ABNORMAL HIGH (ref 70–99)
Glucose-Capillary: 159 mg/dL — ABNORMAL HIGH (ref 70–99)
Glucose-Capillary: 89 mg/dL (ref 70–99)

## 2020-06-07 LAB — BASIC METABOLIC PANEL
Anion gap: 6 (ref 5–15)
BUN: 13 mg/dL (ref 8–23)
CO2: 24 mmol/L (ref 22–32)
Calcium: 7.9 mg/dL — ABNORMAL LOW (ref 8.9–10.3)
Chloride: 111 mmol/L (ref 98–111)
Creatinine, Ser: 0.9 mg/dL (ref 0.44–1.00)
GFR calc Af Amer: >60 mL/min (ref >60)
GFR calc non Af Amer: >60 mL/min (ref >60)
Glucose, Bld: 107 mg/dL — ABNORMAL HIGH (ref 70–99)
Potassium: 3.8 mmol/L (ref 3.5–5.1)
Sodium: 141 mmol/L (ref 135–145)

## 2020-06-07 LAB — MAGNESIUM: Magnesium: 1.9 mg/dL (ref 1.7–2.4)

## 2020-06-07 LAB — CBC
HCT: 30.4 % — ABNORMAL LOW (ref 36.0–46.0)
Hemoglobin: 10.2 g/dL — ABNORMAL LOW (ref 12.0–15.0)
MCH: 33.4 pg (ref 26.0–34.0)
MCHC: 33.6 g/dL (ref 30.0–36.0)
MCV: 99.7 fL (ref 80.0–100.0)
Platelets: 183 10*3/uL (ref 150–400)
RBC: 3.05 MIL/uL — ABNORMAL LOW (ref 3.87–5.11)
RDW: 14.1 % (ref 11.5–15.5)
WBC: 6 10*3/uL (ref 4.0–10.5)
nRBC: 0 % (ref 0.0–0.2)

## 2020-06-07 LAB — PHOSPHORUS: Phosphorus: 2 mg/dL — ABNORMAL LOW (ref 2.5–4.6)

## 2020-06-07 MED ORDER — ENOXAPARIN SODIUM 40 MG/0.4ML ~~LOC~~ SOLN
40.0000 mg | Freq: Every day | SUBCUTANEOUS | Status: DC
  Administered 2020-06-07 – 2020-06-08 (×2): 40 mg via SUBCUTANEOUS
  Filled 2020-06-07 (×2): qty 0.4

## 2020-06-07 MED ORDER — SODIUM PHOSPHATES 45 MMOLE/15ML IV SOLN
25.0000 mmol | Freq: Once | INTRAVENOUS | Status: AC
  Administered 2020-06-07: 25 mmol via INTRAVENOUS
  Filled 2020-06-07: qty 8.33

## 2020-06-07 MED ORDER — PANTOPRAZOLE SODIUM 40 MG IV SOLR
40.0000 mg | Freq: Two times a day (BID) | INTRAVENOUS | Status: DC
  Administered 2020-06-07 – 2020-06-08 (×3): 40 mg via INTRAVENOUS
  Filled 2020-06-07 (×3): qty 40

## 2020-06-07 MED ORDER — ENSURE ENLIVE PO LIQD
237.0000 mL | Freq: Two times a day (BID) | ORAL | Status: DC
  Administered 2020-06-08: 237 mL via ORAL

## 2020-06-07 NOTE — Progress Notes (Signed)
NAME:  Alexandria Wallace, MRN:  [id number], DOB:  02-11-1941, LOS: 2 ADMISSION DATE:  06/05/2020, CONSULTATION DATE:  06/05/20 REFERRING MD:  Wilson Singer  CHIEF COMPLAINT:  Headache, nausea, vomiting   Brief History   Alexandria Wallace is a 79 y.o. female who was admitted 7/7 with septic shock and concern for meningitis.  History of present illness   Alexandria Wallace is a 79 y.o. female who has a PMH including but not limited to mood disorder, HLD (see "past medical history" for rest).  She presented to [facility] ED 7/7 with nausea, vomiting, headache.  She was in her usual state of health when she went to bed the night prior.  After waking up and experiencing symptoms, she later developed posterior headache with photophobia.  Denies any lightheadedness, nuchal rigidity, syncope, fevers/chills/sweats, chest pain, dyspnea, myalgias, rashes, exposures to known sick contacts.  In ED, she was hypotensive and somewhat altered.  Head CT was negative.  Due to constellation of symptoms, LP was performed in ED and she was started on empiric vanc / cefepime.  Due to persistent hypotension, CVL was placed and she was started on norepinephrine.  CT abd / pelv also ordered to r/o intraabdominal process.  Past Medical History  has Murmur; Mixed hyperlipidemia; Dyspnea; [place]at[place] coronary artery calcium score between 100 and 199; Septic shock ([place]); AKI (acute kidney injury) ([place]); Metabolic acidosis, increased anion gap; Elevated troponin; Nausea; and Nausea and vomiting on their problem list.  Significant Hospital Events   7/7 > admit.  Consults:  None.  Procedures:  CVL 7/7 >   Significant Diagnostic Tests:  CT head 7/7 > neg. CT A / P 7/7 > neg. Echo 7/8 >   Micro Data:  COVID 7/7 > neg. Blood 7/7 >  CSF 7/7 >  CSF HSV 7/7 >   Antimicrobials:  Vanc 7/7 > 7/8. Cefepime 7/7 >  Acyclovir 7/7 >   Interim history/subjective:   Still having some nausea and vomiting, feels unable to keep anything down.   CT abd / pelv unrevealing.  Objective:  Blood pressure 103/60, pulse 60, temperature (!) 97.5 F (36.4 C), temperature source Axillary, resp. rate 15, weight 71.6 kg, SpO2 100 %. CVP:  [6 mmHg-12 mmHg] 8 mmHg      Intake/Output Summary (Last 24 hours) at 06/07/2020 0904 Last data filed at 06/07/2020 0800 Gross per 24 hour  Intake 4228.21 ml  Output 1950 ml  Net 2278.21 ml   Filed Weights   06/06/20 0428 06/07/20 0500  Weight: 67.3 kg 71.6 kg    Examination: General: Adult female, in NAD. Neuro: A&O x 3, no deficits. HEENT: [place]/AT. Sclerae anicteric.  EOMI. Cardiovascular: RRR, no M/R/G.  Lungs: Respirations even and unlabored.  CTA bilaterally, No W/R/R.  Abdomen: BS x 4, soft, NT/ND.  Musculoskeletal: No gross deformities, no edema.  Skin: Intact, warm, no rashes.  Assessment & Plan:   Shock - unclear etiology, on presentation there was initial concern for septic 2/2 meningitis; however, I highly doubt this to be the case (Bacterial meningitis at least. Viral / HSV still possible but also doubtful given CSF composition).  CSF unrevealing and cultures and gram stain still pending.  Favor hypovolemic shock at this point. - Stop all antibiotics if cultures remain negative  -Remove central line now that off pressors.   Troponin bump - suspect demand ischemia.  She remains asymptomatic. Demand-related ischemia - Cardiology consultation, may require repeat risk stratification.   Nausea with vomiting - CT  unrevealing.  Slightly improved with zofran but still feels she is unable to keep anything down.  ? Hypomotility / gastroparesis. - Continue zofran. - Add reglan. - GI consultation for motility disorder - May be due to anti-lipid medications.   Mild anemia. - Transfuse for Hgb < 7.  Hx HLD. - Continue home ASA, CoQ10, niacin, zetia.  Hx mood disorder. - Continue home buspirone, fluoxetine.  Best Practice:  Diet: Heart healthy. Pain/Anxiety/Delirium protocol (if  indicated): N/A. VAP protocol (if indicated): N/A. DVT prophylaxis: SCD's / Heparin. GI prophylaxis: N/A. Glucose control: N/A. Mobility: Bedrest. Code Status: Full. Family Communication: None available. Disposition: ready to transfer to telemetry.  Orders reconciliation performed and [facility] contacted.    Kipp Brood, MD [facility] ICU Physician [place]  Pager: [phone] Mobile: [phone] After hours: [phone].  06/07/2020, 9:10 AM      06/07/2020, 9:04 AM

## 2020-06-07 NOTE — Consult Note (Signed)
Referring Provider: Dr, Kipp Brood Primary Care Physician:  Cari Caraway, MD Primary Gastroenterologist:  Dr. Alessandra Bevels [facility])  Reason for Consultation:  Intractable nausea and vomiting  HPI: Alexandria Wallace is a 79 y.o. 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  Patient reports she had an episode of nausea and vomiting last month on 6/8, which resolved.  She felt fine overall until yesterday when she presented to the ED with nausea, vomiting, headache, and neck pain.  She was hypotensive and was found to have lactic acidosis.  Today, she reports continued nausea and states she is unable to eat or drink anything due to nausea with vomiting.  She has not had any vomiting per her report since admission, though she did have several episodes of emesis prior to admission.  Denies any hematemesis.  Has had progressive nausea that has been worse in the morning.  She states she believes she has lost some weight but cannot quantify the amount of weight or over what period of time.  She has been having acid reflux as well, stating she had severe acid reflux after taking an aspirin.  She denies any abdominal pain or dysphagia.  She states her stools have been formed recently and denies any melena or hematochezia.  She further denies any new medications.  She states that she has been taking niacin for 6 months and it is possible that that has caused nausea, but she has not taken it since 7/6.  Reports a history of an uncle and grandfather with colon cancer.    Patient's last colonoscopy was in 05/2017 which showed normal rectal anastomosis and normal colon with no polyps.  She also had a flexible sigmoidoscopy on 12/2017 which showed patent end-to-end colorectal anastomosis with erosion, erythema, friability, and a hemorrhagic appearance (this was treated with  APC).  She has never had an EGD.   Past Medical History:  Diagnosis Date  . High cholesterol   . Mood disorder [facility])     Past Surgical History:  Procedure Laterality Date  . COLECTOMY    . EUS N/A 11/29/2015   Procedure: LOWER ENDOSCOPIC ULTRASOUND (EUS);  Surgeon: Arta Silence, MD;  Location: [facility];  Service: Endoscopy;  Laterality: N/A;  . FLEXIBLE SIGMOIDOSCOPY N/A 01/27/2018   Procedure: FLEXIBLE SIGMOIDOSCOPY;  Surgeon: Otis Brace, MD;  Location: [place];  Service: Gastroenterology;  Laterality: N/A;  . LEFT HEART CATH AND CORONARY ANGIOGRAPHY N/A 11/15/2018   Procedure: LEFT HEART CATH AND CORONARY ANGIOGRAPHY;  Surgeon: Troy Sine, MD;  Location: [place] [facility];  Service: Cardiovascular;  Laterality: N/A;  . POLYPECTOMY  02/19/2016    Prior to Admission medications   Medication Sig Start Date End Date Taking? Authorizing Provider  acetaminophen (TYLENOL) 500 MG tablet Take 500 mg by mouth every 6 (six) hours as needed (for pain.).   Yes [provider]  Ascorbic Acid (VITAMIN C) 1000 MG tablet Take 1,000 mg by mouth every evening.   Yes [provider]  busPIRone (BUSPAR) 10 MG tablet Take 10 mg by mouth daily.    Yes [provider]  Cholecalciferol (VITAMIN D3) 75 MCG (3000 UT) TABS Take 3,000 Units by mouth daily.   Yes [provider]  Coenzyme Q10 (CO Q10) 100 MG CAPS Take 100 mg by mouth every evening.    Yes [provider]  FLUoxetine (PROZAC) 10  MG capsule Take 10 mg by mouth daily.   Yes [provider]  ibuprofen (ADVIL,MOTRIN) 200 MG tablet Take 200 mg by mouth every 8 (eight) hours as needed (for pain.).   Yes [provider]  Methylfol-Algae-B12-Acetylcyst (CEREFOLIN NAC) 6-90.314-2-600 MG TABS Take 1 tablet by mouth 2 (two) times daily.   Yes [provider]  niacin 500 MG tablet Take 500 mg by mouth 3 (three) times daily with meals.   Yes [provider]   Omega-3 Fatty Acids (FISH OIL) 1000 MG CAPS Take 1,000 mg by mouth at bedtime.   Yes [provider]  ondansetron (ZOFRAN ODT) 4 MG disintegrating tablet Take 1 tablet (4 mg total) by mouth every 8 (eight) hours as needed for nausea or vomiting. 05/07/20  Yes Aberman, Druscilla Brownie, PA-C  TURMERIC PO Take 448 mg by mouth every evening.   Yes [provider]  vitamin B-12 (CYANOCOBALAMIN) 1000 MCG tablet Take 1,000 mcg by mouth daily.   Yes [provider]    Scheduled Meds: . vitamin C  1,000 mg Oral QPM  . aspirin  325 mg Oral Daily  . busPIRone  10 mg Oral BID  . Chlorhexidine Gluconate Cloth  6 each Topical Daily  . cholecalciferol  3,000 Units Oral Daily  . enoxaparin (LOVENOX) injection  40 mg Subcutaneous Daily  . ezetimibe  10 mg Oral QPM  . FLUoxetine  10 mg Oral Daily  . insulin aspart  0-15 Units Subcutaneous Q4H  . metoCLOPramide (REGLAN) injection  5 mg Intravenous Q8H  . multivitamin with minerals  1 tablet Oral QPM  . niacin  500 mg Oral TID WC  . omega-3 acid ethyl esters  1 g Oral QHS  . sodium chloride flush  10-40 mL Intracatheter Q12H  . vitamin B-12  1,000 mcg Oral Daily   Continuous Infusions: . ceFEPime (MAXIPIME) IV Stopped (06/07/20 0317)  . lactated ringers    . lactated ringers 75 mL/hr at 06/07/20 1000  . sodium phosphate  Dextrose 5% IVPB 25 mmol (06/07/20 1112)   PRN Meds:.acetaminophen, alum & mag hydroxide-simeth, docusate sodium, ondansetron (ZOFRAN) IV, polyethylene glycol, sodium chloride flush  Allergies as of 06/05/2020  . (No Known Allergies)    Family History  Problem Relation Age of Onset  . Heart attack Mother   . Cancer Father   . Cancer Sister 62  . Other Brother 10       meningitis     Social History   Socioeconomic History  . Marital status: Legally Separated    Spouse name: Not on file  . Number of children: 2  . Years of education: master's  . Highest education level: Not on file  Occupational  History  . Occupation: retired Pharmacist, hospital  Tobacco Use  . Smoking status: Never Smoker  . Smokeless tobacco: Never Used  Substance and Sexual Activity  . Alcohol use: Yes    Alcohol/week: 2.0 standard drinks    Types: 2 Glasses of wine per week  . Drug use: No  . Sexual activity: Not on file  Other Topics Concern  . Not on file  Social History Narrative   Exercise - low impact aerobics, weight class, walking 4-5x/week for 1 hour sessions   Epworth Sleepiness Scale = 6 (11/11/16)   Social Determinants of Health   Financial Resource Strain:   . Difficulty of Paying Living Expenses:   Food Insecurity:   . Worried About Charity fundraiser in the Last Year:   .  Ran Out of Food in the Last Year:   Transportation Needs:   . Film/video editor (Medical):   Marland Kitchen Lack of Transportation (Non-Medical):   Physical Activity:   . Days of Exercise per Week:   . Minutes of Exercise per Session:   Stress:   . Feeling of Stress :   Social Connections:   . Frequency of Communication with Friends and Family:   . Frequency of Social Gatherings with Friends and Family:   . Attends Religious Services:   . Active Member of Clubs or Organizations:   . Attends Archivist Meetings:   Marland Kitchen Marital Status:   Intimate Partner Violence:   . Fear of Current or Ex-Partner:   . Emotionally Abused:   Marland Kitchen Physically Abused:   . Sexually Abused:     Review of Systems: Review of Systems  Constitutional: Positive for weight loss. Negative for chills and fever.  HENT: Negative for sore throat.   Eyes: Negative for pain and redness.  Respiratory: Negative for cough, shortness of breath and stridor.   Cardiovascular: Negative for chest pain and palpitations.  Gastrointestinal: Positive for heartburn, nausea and vomiting. Negative for abdominal pain, blood in stool, constipation, diarrhea and melena.  Genitourinary: Negative for dysuria and flank pain.  Musculoskeletal: Negative for back pain and joint  pain.  Skin: Negative for itching and rash.  Neurological: Negative for seizures and loss of consciousness.  Endo/Heme/Allergies: Negative for polydipsia. Does not bruise/bleed easily.  Psychiatric/Behavioral: Negative for substance abuse. The patient is not nervous/anxious.     Physical Exam: Vital signs: Vitals:   06/07/20 0930 06/07/20 0945  BP: 115/60 106/64  Pulse: 63 66  Resp: 14 15  Temp:    SpO2: 100% 100%   Last BM Date: 06/07/20 Physical Exam Constitutional:      General: She is not in acute distress.    Appearance: Normal appearance.  HENT:     Head: Normocephalic and atraumatic.     Nose: Nose normal.     Mouth/Throat:     Mouth: Mucous membranes are moist.     Pharynx: Oropharynx is clear.  Eyes:     General: No scleral icterus.    Extraocular Movements: Extraocular movements intact.     Conjunctiva/sclera: Conjunctivae normal.  Cardiovascular:     Rate and Rhythm: Normal rate and regular rhythm.     Pulses: Normal pulses.     Heart sounds: Normal heart sounds.  Pulmonary:     Effort: Pulmonary effort is normal. No respiratory distress.     Breath sounds: Normal breath sounds.  Abdominal:     General: Bowel sounds are decreased. There is no distension.     Palpations: Abdomen is soft. There is no mass.     Tenderness: There is no abdominal tenderness. There is no guarding or rebound.     Hernia: No hernia is present.  Musculoskeletal:        General: No swelling or tenderness.     Cervical back: Normal range of motion and neck supple.  Skin:    General: Skin is warm and dry.  Neurological:     General: No focal deficit present.     Mental Status: She is alert and oriented to person, place, and time.     Comments: Patient appears oriented, though responses are somewhat slowed.  Initially was confused about date but corrected herself.  Psychiatric:        Mood and Affect: Mood normal.  Behavior: Behavior normal.      GI:  Lab  Results: Recent Labs    06/05/20 1208 06/06/20 0429 06/07/20 0454  WBC 13.1* 20.3* 6.0  HGB 10.4* 11.3* 10.2*  HCT 32.6* 33.9* 30.4*  PLT 214 272 183   BMET Recent Labs    06/05/20 1208 06/06/20 0228 06/07/20 0454  NA 143 140 141  K 3.8 4.4 3.8  CL 111 110 111  CO2 14* 17* 24  GLUCOSE 93 223* 107*  BUN 14 20 13   CREATININE 1.66* 1.27* 0.90  CALCIUM 7.7* 7.7* 7.9*   LFT Recent Labs    06/05/20 1422  PROT 4.8*  ALBUMIN 2.5*  AST 46*  ALT 38  ALKPHOS 51  BILITOT 0.5  BILIDIR 0.2  IBILI 0.3   PT/INR No results for input(s): LABPROT, INR in the last 72 hours.   Studies/Results: CT ABDOMEN PELVIS WO CONTRAST  Result Date: 06/05/2020 CLINICAL DATA:  Sepsis, hypotension EXAM: CT ABDOMEN AND PELVIS WITHOUT CONTRAST TECHNIQUE: Multidetector CT imaging of the abdomen and pelvis was performed following the standard protocol without IV contrast. COMPARISON:  None. FINDINGS: Lower chest: Lung bases demonstrate no acute consolidation or pleural effusion. Coronary vascular calcification. Small hiatal hernia. Hepatobiliary: No focal liver abnormality is seen. No gallstones, gallbladder wall thickening, or biliary dilatation. Pancreas: Unremarkable. No pancreatic ductal dilatation or surrounding inflammatory changes. Spleen: Normal in size without focal abnormality. Adrenals/Urinary Tract: Adrenal glands are within normal limits. Kidneys show no hydronephrosis. No ureteral stone. The bladder is unremarkable. Stomach/Bowel: Stomach is nondistended. No dilated small bowel. Negative appendix. 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 Postsurgical changes at the rectosigmoid colon. Large retained feces at the rectum. Vascular/Lymphatic: Mild aortic atherosclerosis without aneurysm. No suspicious nodes. Reproductive: Uterus and bilateral adnexa are unremarkable. Other: No free air or free fluid. Musculoskeletal: No acute or significant osseous findings.  IMPRESSION: 1. No CT evidence for acute intra-abdominal or pelvic abnormality. 2. Sigmoid colon diverticular disease without acute inflammatory change. Large retained feces at the rectum. Aortic Atherosclerosis (ICD10-I70.0). Electronically Signed   By: Donavan Foil M.D.   On: 06/05/2020 17:43   CT Head Wo Contrast  Result Date: 06/05/2020 CLINICAL DATA:  Headache and nausea. EXAM: CT HEAD WITHOUT CONTRAST TECHNIQUE: Contiguous axial images were obtained from the base of the skull through the vertex without intravenous contrast. COMPARISON:  None. FINDINGS: Brain: The ventricles are normal in size and configuration for age. No extra-axial fluid collections are identified. The gray-white differentiation is maintained. No CT findings for acute hemispheric infarction or intracranial hemorrhage. No mass lesions. The brainstem and cerebellum are normal. Vascular: Scattered vascular calcifications. No hyperdense vessels or obvious aneurysm. Skull: No acute skull fracture.  No bone lesion. Sinuses/Orbits: The paranasal sinuses and mastoid air cells are clear. The globes are intact. Other: No scalp lesions, laceration or hematoma. IMPRESSION: No acute intracranial findings or mass lesions. Electronically Signed   By: Marijo Sanes M.D.   On: 06/05/2020 13:15   DG Chest Port 1 View  Result Date: 06/06/2020 CLINICAL DATA:  Respiratory failure. EXAM: PORTABLE CHEST 1 VIEW COMPARISON:  Chest x-ray 06/05/2020. FINDINGS: Right IJ line stable position. Heart size stable. Mild bibasilar subsegmental atelectasis/infiltrates. No pleural effusion or pneumothorax. Degenerative changes scoliosis thoracic spine. IMPRESSION: 1.  Right IJ line stable position. 2.  Mild bibasilar subsegmental atelectasis/infiltrates. Electronically Signed   By: Marcello Moores  Register   On: 06/06/2020 06:49   DG Chest Portable 1 View  Result Date: 06/05/2020 CLINICAL  DATA:  Central line placement, hypertension EXAM: PORTABLE CHEST 1 VIEW COMPARISON:   06/05/2020 at 1:20 p.m. FINDINGS: Right internal jugular central venous catheter has been placed with its tip overlying the expected superior vena cava. Lungs are clear. No pneumothorax or pleural effusion. Cardiac size within normal limits. Pulmonary vascularity is normal. IMPRESSION: Right internal jugular central venous catheter tip within the superior vena cava. No pneumothorax. Electronically Signed   By: Fidela Salisbury MD   On: 06/05/2020 15:07   DG Chest Portable 1 View  Result Date: 06/05/2020 CLINICAL DATA:  Abdominal pain and cramping. Hypotension. Possible sepsis. EXAM: PORTABLE CHEST 1 VIEW COMPARISON:  None. FINDINGS: Lungs are clear. Heart size is normal. Aortic atherosclerosis. No pneumothorax or pleural fluid. No acute or focal bony abnormality. IMPRESSION: No acute disease. Aortic Atherosclerosis (ICD10-I70.0). Electronically Signed   By: Inge Rise M.D.   On: 06/05/2020 13:48   ECHOCARDIOGRAM COMPLETE  Result Date: 06/06/2020    ECHOCARDIOGRAM REPORT   Patient Name:   Alexandria Wallace Date of Exam: 06/06/2020 Medical Rec #:  [id number]          Height:       64.0 in Accession #:    [id number]         Weight:       148.4 lb Date of Birth:  May 19, 1941           BSA:          1.723 m Patient Age:    24 years           BP:           110/63 mmHg Patient Gender: F                  HR:           79 bpm. Exam Location:  Inpatient Procedure: 2D Echo, Cardiac Doppler and Color Doppler Indications:    Shock [id number]  History:        Patient has prior history of Echocardiogram examinations, most                 recent 12/07/2016. Risk Factors:Dyslipidemia.  Sonographer:    Jonelle Sidle Dance Referring Phys: [id number] RAHUL P DESAI IMPRESSIONS  1. Left ventricular ejection fraction, by estimation, is 60 to 65%. The left ventricle has normal function. The left ventricle has no regional wall motion abnormalities. There is mild left ventricular hypertrophy. Left ventricular diastolic parameters are consistent with  Grade I diastolic dysfunction (impaired relaxation).  2. Right ventricular systolic function is normal. The right ventricular size is normal. There is mildly elevated pulmonary artery systolic pressure. The estimated right ventricular systolic pressure is 77.8 mmHg.  3. The mitral valve is degenerative. No evidence of mitral valve regurgitation. No evidence of mitral stenosis.  4. The aortic valve is tricuspid. Aortic valve regurgitation is not visualized. Mild aortic valve sclerosis is present, with no evidence of aortic valve stenosis.  5. The inferior vena cava is dilated in size with <50% respiratory variability, suggesting right atrial pressure of 15 mmHg. FINDINGS  Left Ventricle: Left ventricular ejection fraction, by estimation, is 60 to 65%. The left ventricle has normal function. The left ventricle has no regional wall motion abnormalities. The left ventricular internal cavity size was normal in size. There is  mild left ventricular hypertrophy. Left ventricular diastolic parameters are consistent with Grade I diastolic dysfunction (impaired relaxation). Right Ventricle: The right ventricular size is normal. No increase in right ventricular  wall thickness. Right ventricular systolic function is normal. There is mildly elevated pulmonary artery systolic pressure. The tricuspid regurgitant velocity is 2.50  m/s, and with an assumed right atrial pressure of 15 mmHg, the estimated right ventricular systolic pressure is 75.9 mmHg. Left Atrium: Left atrial size was normal in size. Right Atrium: Right atrial size was normal in size. Pericardium: There is no evidence of pericardial effusion. Mitral Valve: The mitral valve is degenerative in appearance. There is mild calcification of the mitral valve leaflet(s). Moderate mitral annular calcification. No evidence of mitral valve regurgitation. No evidence of mitral valve stenosis. Tricuspid Valve: The tricuspid valve is normal in structure. Tricuspid valve  regurgitation is mild. Aortic Valve: The aortic valve is tricuspid. Aortic valve regurgitation is not visualized. Mild aortic valve sclerosis is present, with no evidence of aortic valve stenosis. Pulmonic Valve: The pulmonic valve was normal in structure. Pulmonic valve regurgitation is not visualized. Aorta: The aortic root is normal in size and structure. Venous: The inferior vena cava is dilated in size with less than 50% respiratory variability, suggesting right atrial pressure of 15 mmHg. IAS/Shunts: No atrial level shunt detected by color flow Doppler.  LEFT VENTRICLE PLAX 2D LVIDd:         3.10 cm  Diastology LVIDs:         2.20 cm  LV e' lateral:   8.38 cm/s LV PW:         1.10 cm  LV E/e' lateral: 15.2 LV IVS:        0.90 cm  LV e' medial:    8.16 cm/s LVOT diam:     2.10 cm  LV E/e' medial:  15.6 LV SV:         97 LV SV Index:   56 LVOT Area:     3.46 cm  RIGHT VENTRICLE             IVC RV Basal diam:  3.10 cm     IVC diam: 2.40 cm RV Mid diam:    2.30 cm RV S prime:     21.30 cm/s TAPSE (M-mode): 2.4 cm LEFT ATRIUM             Index       RIGHT ATRIUM           Index LA diam:        3.50 cm 2.03 cm/m  RA Area:     13.90 cm LA Vol (A2C):   28.5 ml 16.54 ml/m RA Volume:   29.70 ml  17.24 ml/m LA Vol (A4C):   34.3 ml 19.90 ml/m LA Biplane Vol: 32.8 ml 19.03 ml/m  AORTIC VALVE LVOT Vmax:   141.00 cm/s LVOT Vmean:  83.800 cm/s LVOT VTI:    0.281 m  AORTA Ao Root diam: 3.10 cm Ao Asc diam:  3.10 cm MITRAL VALVE                TRICUSPID VALVE MV Area (PHT): 3.91 cm     TR Peak grad:   25.0 mmHg MV Decel Time: 194 msec     TR Vmax:        250.00 cm/s MV E velocity: 127.00 cm/s MV A velocity: 111.00 cm/s  SHUNTS MV E/A ratio:  1.14         Systemic VTI:  0.28 m  Systemic Diam: 2.10 cm Loralie Champagne MD Electronically signed by Loralie Champagne MD Signature Date/Time: 06/06/2020/4:45:26 PM    Final     Impression: Intractable nausea and vomiting.  GERD versus gastroparesis. -CT  06/05/2020 did not show any acute intra-abdominal findings. Large retained feces at the rectum.  Septic shock of unknown origin.  Hypotension has resolved, as has leukocytosis.  Today, WBC 6.0.  Mild normocytic anemia.  No signs of GI bleeding.  Plan: Recommend Protonix 40 mg IV twice daily, can change to PO dosing once patient is tolerating PO intake.  Recommend scheduled antiemetics in the morning with as needed doses in the afternoon and evening.    Recommend scheduled Reglan in the morning with as needed doses and the afternoon and evening.  Continue bowel regimen to avoid constipation, as CT scan 7/7 showed large retained feces at the rectum.  If symptoms persist or worsen despite conservative therapy, we will consider EGD.  [facility] will follow.   LOS: 2 days   Salley Slaughter  PA-C 06/07/2020, 11:38 AM  Contact #  [phone]

## 2020-06-07 NOTE — Consult Note (Signed)
Cardiology Consultation:   Patient ID: Alexandria Wallace MRN: [id number]; DOB: Apr 27, 1941  Admit date: 06/05/2020 Date of Consult: 06/07/2020  Primary Care Provider: Cari Wallace, [facility] Cardiologist: Alexandria Casino, MD   Patient Profile:   Alexandria Wallace is a 79 y.o. female with a hx of mild nonobstructive CAD, hyperlipidemia, familial hyperlipidemia and mood disorder who is being seen today for the evaluation of elevated troponin at the request of Dr. Lynetta Wallace.   Patient underwent coronary CT in 2019 for dyspnea on exertion.  It was revealed mild  nonobstructive disease and small RCA.  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  Due to ongoing symptoms patient underwent cardiac catheterization by Alexandria Wallace as below:  Cath 10/2018 There is evidence for very mild coronary calcification without evidence for coronary obstructive disease.  The patient has a very short left main which immediately gives off a large LAD which wraps around the apex, a ramus intermediate vessel, and a very large dominant left circumflex coronary artery which gives off the PDA and PLA vessels and also extends to becomes the right coronary artery reaching almost the sinus of Valsalva.  An RV marginal branch is seen arising from this circumflex extension vessel;  in addition there is a very proximal left atrial circumflex branch which also extends and supplies a small RCA like branch.  Hyperdynamic LV function with suggestion of left ventricular hypertrophy.  EF estimate is 60 to 65%.  LVEDP is 14 mmHg.  RECOMMENDATION: Medical therapy.  Optimal blood pressure control with target blood pressure less than 130/80.  If patient cannot tolerate statin therapy or is unwilling to try, consider PCSK9 inhibition with familial hyperlipidemia.  Last seen by Alexandria Sharp, PA 04/23/2020.  She did not wanted to take statin.  She was on niacin.  History of Present Illness:   Alexandria Wallace work-up 7/7  morning with headache, nausea and vomiting.  She was in usual state of health when went to bed night prior.  She denies chest pain, shortness of breath, palpitation, dizziness, sick contact, rash, lightheadedness, fever, chills or syncope.  CT of head was negative.  She was noted hypotensive and tachycardic in ER.  Suspected shock due to initial concern for meningitis.  Empirically on vancomycin and cefepime.  Nonconclusive CSF study.  Pending culture and Gram study.  Central line was placed.  She was started on norepinephrine).  Unrevealing CT of abdominal pelvis.  Suspected possible hypovolemic shock.  Off pressors.  High-sensitivity troponin peaked at 1532 yesterday.  Suspected demand ischemia.  Cardiology is asked for further evaluation.  Patient had sinus tachycardia in ER but then resolved.  No evidence of arrhythmia.  Patient denies any chest pain or shortness of breath.  Vital signs stable.   Past Medical History:  Diagnosis Date  . High cholesterol   . Mood disorder [facility])     Past Surgical History:  Procedure Laterality Date  . COLECTOMY    . EUS N/A 11/29/2015   Procedure: LOWER ENDOSCOPIC ULTRASOUND (EUS);  Surgeon: Alexandria Silence, MD;  Location: [facility] ENDOSCOPY;  Service: Endoscopy;  Laterality: N/A;  . FLEXIBLE SIGMOIDOSCOPY N/A 01/27/2018   Procedure: FLEXIBLE SIGMOIDOSCOPY;  Surgeon: Alexandria Brace, MD;  Location: [facility];  Service: Gastroenterology;  Laterality: N/A;  . LEFT HEART CATH AND CORONARY ANGIOGRAPHY N/A 11/15/2018   Procedure: LEFT HEART CATH AND CORONARY ANGIOGRAPHY;  Surgeon: Alexandria Sine, MD;  Location: [facility] CV LAB;  Service: Cardiovascular;  Laterality: N/A;  . POLYPECTOMY  02/19/2016    Inpatient Medications: Scheduled Meds: . vitamin C  1,000 mg Oral QPM  . aspirin  325 mg Oral Daily  . busPIRone  10 mg Oral BID  . Chlorhexidine Gluconate Cloth  6 each Topical Daily  . cholecalciferol  3,000 Units Oral Daily  . enoxaparin (LOVENOX) injection   40 mg Subcutaneous Daily  . ezetimibe  10 mg Oral QPM  . FLUoxetine  10 mg Oral Daily  . insulin aspart  0-15 Units Subcutaneous Q4H  . metoCLOPramide (REGLAN) injection  5 mg Intravenous Q8H  . multivitamin with minerals  1 tablet Oral QPM  . niacin  500 mg Oral TID WC  . omega-3 acid ethyl esters  1 g Oral QHS  . sodium chloride flush  10-40 mL Intracatheter Q12H  . vitamin B-12  1,000 mcg Oral Daily   Continuous Infusions: . ceFEPime (MAXIPIME) IV Stopped (06/07/20 0317)  . lactated ringers    . lactated ringers 75 mL/hr at 06/07/20 1000  . sodium phosphate  Dextrose 5% IVPB 25 mmol (06/07/20 1112)   PRN Meds: acetaminophen, alum & mag hydroxide-simeth, docusate sodium, ondansetron (ZOFRAN) IV, polyethylene glycol, sodium chloride flush  Allergies:    Allergies  Allergen Reactions  . Chocolate     Stated by patient from blood work    Social History:   Social History   Socioeconomic History  . Marital status: Legally Separated    Spouse name: Not on file  . Number of children: 2  . Years of education: master's  . Highest education level: Not on file  Occupational History  . Occupation: retired Pharmacist, hospital  Tobacco Use  . Smoking status: Never Smoker  . Smokeless tobacco: Never Used  Substance and Sexual Activity  . Alcohol use: Yes    Alcohol/week: 2.0 standard drinks    Types: 2 Glasses of wine per week  . Drug use: No  . Sexual activity: Not on file  Other Topics Concern  . Not on file  Social History Narrative   Exercise - low impact aerobics, weight class, walking 4-5x/week for 1 hour sessions   Epworth Sleepiness Scale = 6 (11/11/16)   Social Determinants of Health   Financial Resource Strain:   . Difficulty of Paying Living Expenses:   Food Insecurity:   . Worried About Charity fundraiser in the Last Year:   . Arboriculturist in the Last Year:   Transportation Needs:   . Film/video editor (Medical):   Marland Kitchen Lack of Transportation (Non-Medical):    Physical Activity:   . Days of Exercise per Week:   . Minutes of Exercise per Session:   Stress:   . Feeling of Stress :   Social Connections:   . Frequency of Communication with Friends and Family:   . Frequency of Social Gatherings with Friends and Family:   . Attends Religious Services:   . Active Member of Clubs or Organizations:   . Attends Archivist Meetings:   Marland Kitchen Marital Status:   Intimate Partner Violence:   . Fear of Current or Ex-Partner:   . Emotionally Abused:   Marland Kitchen Physically Abused:   . Sexually Abused:     Family History:   Family History  Problem Relation Age of Onset  . Heart attack Mother   . Cancer Father   . Cancer Sister 54  . Other Brother 10       meningitis      ROS:  Please see the  history of present illness.  All other ROS reviewed and negative.     Physical Exam/Data:   Vitals:   06/07/20 0915 06/07/20 0930 06/07/20 0945 06/07/20 1100  BP: 110/64 115/60 106/64   Pulse: 74 63 66   Resp: 17 14 15    Temp:    99 F (37.2 C)  TempSrc:    Oral  SpO2: 90% 100% 100%   Weight:        Intake/Output Summary (Last 24 hours) at 06/07/2020 1231 Last data filed at 06/07/2020 1000 Gross per 24 hour  Intake 3180.1 ml  Output 1800 ml  Net 1380.1 ml   Last 3 Weights 06/07/2020 06/06/2020 05/07/2020  Weight (lbs) 157 lb 13.6 oz 148 lb 5.9 oz 150 lb  Weight (kg) 71.6 kg 67.3 kg 68.04 kg     Body mass index is 27.09 kg/m.  General:  Well nourished, well developed, in no acute distress HEENT: normal Lymph: no adenopathy Neck: no JVD Endocrine:  No thryomegaly Vascular: No carotid bruits; FA pulses 2+ bilaterally without bruits  Cardiac:  normal S1, S2; RRR; no murmur  Lungs:  clear to auscultation bilaterally, no wheezing, rhonchi or rales  Abd: soft, nontender, no hepatomegaly  Ext: no edema Musculoskeletal:  No deformities, BUE and BLE strength normal and equal Skin: warm and dry  Neuro:  CNs 2-12 intact, no focal abnormalities  noted Psych:  Normal affect   EKG:  The EKG was personally reviewed and demonstrates: Normal sinus rhythm Telemetry:  Telemetry was personally reviewed and demonstrates: Normal sinus rhythm at controlled rate  Relevant CV Studies:   Coronary CT 08/2018 IMPRESSION: 1. The patient's coronary artery calcium score is 130, which places the patient in the 65th percentile. This is greater than expected when compared to age and gender matched peers. Calcium seen in the left circumflex artery.  2. 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 severe proximal RCA stenosis in a nondominant, diminutive vessel. Left dominant with collateral flow from left to right. Diminutive proximal RCA with reconstitution at the mid RCA by an anomalous RV branch and left sided collaterals.  3. Mild CAD in the left circumflex artery (25-49% stenosis), CADRADS = 2.  4. 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  Laboratory Data:  High Sensitivity Troponin:   Recent Labs  Lab 06/05/20 1258 06/05/20 1444 06/06/20 0032 06/06/20 0228 06/06/20 0938  TROPONINIHS 14 47* 1,135* 1,391* 1,532*     Chemistry Recent Labs  Lab 06/05/20 1208 06/06/20 0228 06/07/20 0454  NA 143 140 141  K 3.8 4.4 3.8  CL 111 110 111  CO2 14* 17* 24  GLUCOSE 93 223* 107*  BUN 14 20 13   CREATININE 1.66* 1.27* 0.90  CALCIUM 7.7* 7.7* 7.9*  GFRNONAA 29* 41* >60  GFRAA 34* 47* >60  ANIONGAP 18* 13 6    Recent Labs  Lab 06/05/20 1422  PROT 4.8*  ALBUMIN 2.5*  AST 46*  ALT 38  ALKPHOS 51  BILITOT 0.5   Hematology Recent Labs  Lab 06/05/20 1208 06/06/20 0429 06/07/20 0454  WBC 13.1* 20.3* 6.0  RBC 3.05* 3.29* 3.05*  HGB 10.4* 11.3* 10.2*  HCT 32.6* 33.9* 30.4*  MCV 106.9* 103.0* 99.7  MCH 34.1* 34.3* 33.4  MCHC 31.9 33.3 33.6  RDW 13.5 13.9 14.1  PLT 214 272 183   Radiology/Studies:  CT ABDOMEN PELVIS WO CONTRAST  Result Date: 06/05/2020 CLINICAL DATA:  Sepsis,  hypotension EXAM: CT ABDOMEN AND PELVIS WITHOUT  CONTRAST TECHNIQUE: Multidetector CT imaging of the abdomen and pelvis was performed following the standard protocol without IV contrast. COMPARISON:  None. FINDINGS: Lower chest: Lung bases demonstrate no acute consolidation or pleural effusion. Coronary vascular calcification. Small hiatal hernia. Hepatobiliary: No focal liver abnormality is seen. No gallstones, gallbladder wall thickening, or biliary dilatation. Pancreas: Unremarkable. No pancreatic ductal dilatation or surrounding inflammatory changes. Spleen: Normal in size without focal abnormality. Adrenals/Urinary Tract: Adrenal glands are within normal limits. Kidneys show no hydronephrosis. No ureteral stone. The bladder is unremarkable. Stomach/Bowel: Stomach is nondistended. No dilated small bowel. Negative appendix. 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 Postsurgical changes at the rectosigmoid colon. Large retained feces at the rectum. Vascular/Lymphatic: Mild aortic atherosclerosis without aneurysm. No suspicious nodes. Reproductive: Uterus and bilateral adnexa are unremarkable. Other: No free air or free fluid. Musculoskeletal: No acute or significant osseous findings. IMPRESSION: 1. No CT evidence for acute intra-abdominal or pelvic abnormality. 2. Sigmoid colon diverticular disease without acute inflammatory change. Large retained feces at the rectum. Aortic Atherosclerosis (ICD10-I70.0). Electronically Signed   By: Donavan Foil M.D.   On: 06/05/2020 17:43   CT Head Wo Contrast  Result Date: 06/05/2020 CLINICAL DATA:  Headache and nausea. EXAM: CT HEAD WITHOUT CONTRAST TECHNIQUE: Contiguous axial images were obtained from the base of the skull through the vertex without intravenous contrast. COMPARISON:  None. FINDINGS: Brain: The ventricles are normal in size and configuration for age. No extra-axial fluid collections are identified. The gray-white  differentiation is maintained. No CT findings for acute hemispheric infarction or intracranial hemorrhage. No mass lesions. The brainstem and cerebellum are normal. Vascular: Scattered vascular calcifications. No hyperdense vessels or obvious aneurysm. Skull: No acute skull fracture.  No bone lesion. Sinuses/Orbits: The paranasal sinuses and mastoid air cells are clear. The globes are intact. Other: No scalp lesions, laceration or hematoma. IMPRESSION: No acute intracranial findings or mass lesions. Electronically Signed   By: Marijo Sanes M.D.   On: 06/05/2020 13:15   DG Chest Port 1 View  Result Date: 06/06/2020 CLINICAL DATA:  Respiratory failure. EXAM: PORTABLE CHEST 1 VIEW COMPARISON:  Chest x-ray 06/05/2020. FINDINGS: Right IJ line stable position. Heart size stable. Mild bibasilar subsegmental atelectasis/infiltrates. No pleural effusion or pneumothorax. Degenerative changes scoliosis thoracic spine. IMPRESSION: 1.  Right IJ line stable position. 2.  Mild bibasilar subsegmental atelectasis/infiltrates. Electronically Signed   By: Marcello Moores  Register   On: 06/06/2020 06:49   DG Chest Portable 1 View  Result Date: 06/05/2020 CLINICAL DATA:  Central line placement, hypertension EXAM: PORTABLE CHEST 1 VIEW COMPARISON:  06/05/2020 at 1:20 p.m. FINDINGS: Right internal jugular central venous catheter has been placed with its tip overlying the expected superior vena cava. Lungs are clear. No pneumothorax or pleural effusion. Cardiac size within normal limits. Pulmonary vascularity is normal. IMPRESSION: Right internal jugular central venous catheter tip within the superior vena cava. No pneumothorax. Electronically Signed   By: Fidela Salisbury MD   On: 06/05/2020 15:07   DG Chest Portable 1 View  Result Date: 06/05/2020 CLINICAL DATA:  Abdominal pain and cramping. Hypotension. Possible sepsis. EXAM: PORTABLE CHEST 1 VIEW COMPARISON:  None. FINDINGS: Lungs are clear. Heart size is normal. Aortic  atherosclerosis. No pneumothorax or pleural fluid. No acute or focal bony abnormality. IMPRESSION: No acute disease. Aortic Atherosclerosis (ICD10-I70.0). Electronically Signed   By: Inge Rise M.D.   On: 06/05/2020 13:48   ECHOCARDIOGRAM COMPLETE  Result Date: 06/06/2020    ECHOCARDIOGRAM REPORT  Patient Name:   CANDIACE WEST Date of Exam: 06/06/2020 Medical Rec #:  [id number]          Height:       64.0 in Accession #:    [id number]         Weight:       148.4 lb Date of Birth:  07-Jul-1941           BSA:          1.723 m Patient Age:    55 years           BP:           110/63 mmHg Patient Gender: F                  HR:           79 bpm. Exam Location:  Inpatient Procedure: 2D Echo, Cardiac Doppler and Color Doppler Indications:    Shock [id number]  History:        Patient has prior history of Echocardiogram examinations, most                 recent 12/07/2016. Risk Factors:Dyslipidemia.  Sonographer:    Jonelle Sidle Dance Referring Phys: [id number] RAHUL P DESAI IMPRESSIONS  1. Left ventricular ejection fraction, by estimation, is 60 to 65%. The left ventricle has normal function. The left ventricle has no regional wall motion abnormalities. There is mild left ventricular hypertrophy. Left ventricular diastolic parameters are consistent with Grade I diastolic dysfunction (impaired relaxation).  2. Right ventricular systolic function is normal. The right ventricular size is normal. There is mildly elevated pulmonary artery systolic pressure. The estimated right ventricular systolic pressure is 22.9 mmHg.  3. The mitral valve is degenerative. No evidence of mitral valve regurgitation. No evidence of mitral stenosis.  4. The aortic valve is tricuspid. Aortic valve regurgitation is not visualized. Mild aortic valve sclerosis is present, with no evidence of aortic valve stenosis.  5. The inferior vena cava is dilated in size with <50% respiratory variability, suggesting right atrial pressure of 15 mmHg. FINDINGS  Left  Ventricle: Left ventricular ejection fraction, by estimation, is 60 to 65%. The left ventricle has normal function. The left ventricle has no regional wall motion abnormalities. The left ventricular internal cavity size was normal in size. There is  mild left ventricular hypertrophy. Left ventricular diastolic parameters are consistent with Grade I diastolic dysfunction (impaired relaxation). Right Ventricle: The right ventricular size is normal. No increase in right ventricular wall thickness. Right ventricular systolic function is normal. There is mildly elevated pulmonary artery systolic pressure. The tricuspid regurgitant velocity is 2.50  m/s, and with an assumed right atrial pressure of 15 mmHg, the estimated right ventricular systolic pressure is 79.8 mmHg. Left Atrium: Left atrial size was normal in size. Right Atrium: Right atrial size was normal in size. Pericardium: There is no evidence of pericardial effusion. Mitral Valve: The mitral valve is degenerative in appearance. There is mild calcification of the mitral valve leaflet(s). Moderate mitral annular calcification. No evidence of mitral valve regurgitation. No evidence of mitral valve stenosis. Tricuspid Valve: The tricuspid valve is normal in structure. Tricuspid valve regurgitation is mild. Aortic Valve: The aortic valve is tricuspid. Aortic valve regurgitation is not visualized. Mild aortic valve sclerosis is present, with no evidence of aortic valve stenosis. Pulmonic Valve: The pulmonic valve was normal in structure. Pulmonic valve regurgitation is not visualized. Aorta: The aortic root is normal in size and structure. Venous:  The inferior vena cava is dilated in size with less than 50% respiratory variability, suggesting right atrial pressure of 15 mmHg. IAS/Shunts: No atrial level shunt detected by color flow Doppler.  LEFT VENTRICLE PLAX 2D LVIDd:         3.10 cm  Diastology LVIDs:         2.20 cm  LV e' lateral:   8.38 cm/s LV PW:          1.10 cm  LV E/e' lateral: 15.2 LV IVS:        0.90 cm  LV e' medial:    8.16 cm/s LVOT diam:     2.10 cm  LV E/e' medial:  15.6 LV SV:         97 LV SV Index:   56 LVOT Area:     3.46 cm  RIGHT VENTRICLE             IVC RV Basal diam:  3.10 cm     IVC diam: 2.40 cm RV Mid diam:    2.30 cm RV S prime:     21.30 cm/s TAPSE (M-mode): 2.4 cm LEFT ATRIUM             Index       RIGHT ATRIUM           Index LA diam:        3.50 cm 2.03 cm/m  RA Area:     13.90 cm LA Vol (A2C):   28.5 ml 16.54 ml/m RA Volume:   29.70 ml  17.24 ml/m LA Vol (A4C):   34.3 ml 19.90 ml/m LA Biplane Vol: 32.8 ml 19.03 ml/m  AORTIC VALVE LVOT Vmax:   141.00 cm/s LVOT Vmean:  83.800 cm/s LVOT VTI:    0.281 m  AORTA Ao Root diam: 3.10 cm Ao Asc diam:  3.10 cm MITRAL VALVE                TRICUSPID VALVE MV Area (PHT): 3.91 cm     TR Peak grad:   25.0 mmHg MV Decel Time: 194 msec     TR Vmax:        250.00 cm/s MV E velocity: 127.00 cm/s MV A velocity: 111.00 cm/s  SHUNTS MV E/A ratio:  1.14         Systemic VTI:  0.28 m                             Systemic Diam: 2.10 cm Loralie Champagne MD Electronically signed by Loralie Champagne MD Signature Date/Time: 06/06/2020/4:45:26 PM    Final     Assessment and Plan:   1. Elevated troponin - Hs troponin 14>>47>>1135>>1391>>1532 in setting of shock of unspecified source -Mild nonobstructive CAD by cardiac catheterization December 2019 -Suspected demand ischemia given asymptomatic, nonischemic EKG (brief nonspecific ST depression upon arrival) and no arrhythmia on telemetry.  Likely due to hypovolemic shock  2.  Shock -Per primary team  3.  Hyperlipidemia No results found for requested labs within last 8760 hours.  - Patient does not want to take niacin    For questions or updates, please contact [place] Please consult [email or web address] for contact info under    Jarrett Soho, PA  06/07/2020 12:31 PM

## 2020-06-07 NOTE — Progress Notes (Signed)
OK to pause lactated ringers to give sodium phosphates per Dr. Lynetta Mare.  Failed to get PIV x 3.  Will place IV team consult.  Wanting to D/C central line, will hold off on sodium phosphates until we establish PIV.

## 2020-06-08 DIAGNOSIS — R778 Other specified abnormalities of plasma proteins: Secondary | ICD-10-CM

## 2020-06-08 LAB — CBC WITH DIFFERENTIAL/PLATELET
Abs Immature Granulocytes: 0.01 10*3/uL (ref 0.00–0.07)
Basophils Absolute: 0 10*3/uL (ref 0.0–0.1)
Basophils Relative: 1 %
Eosinophils Absolute: 0.1 10*3/uL (ref 0.0–0.5)
Eosinophils Relative: 2 %
HCT: 31.5 % — ABNORMAL LOW (ref 36.0–46.0)
Hemoglobin: 10.4 g/dL — ABNORMAL LOW (ref 12.0–15.0)
Immature Granulocytes: 0 %
Lymphocytes Relative: 20 %
Lymphs Abs: 1 10*3/uL (ref 0.7–4.0)
MCH: 33.4 pg (ref 26.0–34.0)
MCHC: 33 g/dL (ref 30.0–36.0)
MCV: 101.3 fL — ABNORMAL HIGH (ref 80.0–100.0)
Monocytes Absolute: 0.5 10*3/uL (ref 0.1–1.0)
Monocytes Relative: 10 %
Neutro Abs: 3.2 10*3/uL (ref 1.7–7.7)
Neutrophils Relative %: 67 %
Platelets: 165 10*3/uL (ref 150–400)
RBC: 3.11 MIL/uL — ABNORMAL LOW (ref 3.87–5.11)
RDW: 14 % (ref 11.5–15.5)
WBC: 4.8 10*3/uL (ref 4.0–10.5)
nRBC: 0 % (ref 0.0–0.2)

## 2020-06-08 LAB — BASIC METABOLIC PANEL
Anion gap: 7 (ref 5–15)
BUN: 9 mg/dL (ref 8–23)
CO2: 24 mmol/L (ref 22–32)
Calcium: 8.8 mg/dL — ABNORMAL LOW (ref 8.9–10.3)
Chloride: 109 mmol/L (ref 98–111)
Creatinine, Ser: 0.88 mg/dL (ref 0.44–1.00)
GFR calc Af Amer: >60 mL/min (ref >60)
GFR calc non Af Amer: >60 mL/min (ref >60)
Glucose, Bld: 149 mg/dL — ABNORMAL HIGH (ref 70–99)
Potassium: 4 mmol/L (ref 3.5–5.1)
Sodium: 140 mmol/L (ref 135–145)

## 2020-06-08 LAB — GLUCOSE, CAPILLARY
Glucose-Capillary: 86 mg/dL (ref 70–99)
Glucose-Capillary: 164 mg/dL — ABNORMAL HIGH (ref 70–99)
Glucose-Capillary: 96 mg/dL (ref 70–99)

## 2020-06-08 LAB — PROCALCITONIN: Procalcitonin: 0.13 ng/mL

## 2020-06-08 LAB — LACTIC ACID, PLASMA: Lactic Acid, Venous: 2.5 mmol/L (ref 0.5–1.9)

## 2020-06-08 LAB — MAGNESIUM: Magnesium: 1.7 mg/dL (ref 1.7–2.4)

## 2020-06-08 LAB — PHOSPHORUS: Phosphorus: 2.2 mg/dL — ABNORMAL LOW (ref 2.5–4.6)

## 2020-06-08 MED ORDER — SODIUM PHOSPHATES 45 MMOLE/15ML IV SOLN
30.0000 mmol | Freq: Once | INTRAVENOUS | Status: AC
  Administered 2020-06-08: 30 mmol via INTRAVENOUS
  Filled 2020-06-08: qty 10

## 2020-06-08 MED ORDER — POLYETHYLENE GLYCOL 3350 17 G PO PACK: 17.0000 g | PACK | Freq: Every day | ORAL | 0 refills | Status: DC | PRN

## 2020-06-08 MED ORDER — ONDANSETRON 4 MG PO TBDP
4.0000 mg | ORAL_TABLET | Freq: Three times a day (TID) | ORAL | 0 refills | Status: DC | PRN
Start: 2020-06-08 — End: 2022-07-16

## 2020-06-08 MED ORDER — MAGNESIUM OXIDE 400 (241.3 MG) MG PO TABS: 400.0000 mg | ORAL_TABLET | Freq: Every day | ORAL | 0 refills | Status: DC

## 2020-06-08 MED ORDER — POLYETHYLENE GLYCOL 3350 17 G PO PACK: 17.0000 g | PACK | Freq: Every day | ORAL | 0 refills | Status: DC

## 2020-06-08 MED ORDER — PANTOPRAZOLE SODIUM 40 MG PO TBEC: 40.0000 mg | DELAYED_RELEASE_TABLET | Freq: Two times a day (BID) | ORAL | 0 refills | Status: DC

## 2020-06-08 MED ORDER — ASPIRIN 325 MG PO TABS: 325.0000 mg | ORAL_TABLET | Freq: Every day | ORAL | 0 refills | Status: DC

## 2020-06-08 MED ORDER — MAGNESIUM OXIDE 400 (241.3 MG) MG PO TABS
400.0000 mg | ORAL_TABLET | Freq: Every day | ORAL | Status: DC
  Administered 2020-06-08: 400 mg via ORAL
  Filled 2020-06-08: qty 1

## 2020-06-08 MED ORDER — ADULT MULTIVITAMIN W/MINERALS CH: 1.0000 | ORAL_TABLET | Freq: Every evening | ORAL | 0 refills | Status: DC

## 2020-06-08 MED ORDER — ENSURE ENLIVE PO LIQD: 237.0000 mL | Freq: Two times a day (BID) | ORAL | 0 refills | Status: AC

## 2020-06-08 MED ORDER — PANTOPRAZOLE SODIUM 40 MG PO TBEC: 40.0000 mg | DELAYED_RELEASE_TABLET | Freq: Two times a day (BID) | ORAL | Status: DC

## 2020-06-08 MED ORDER — ZETIA 10 MG PO TABS: 10.0000 mg | ORAL_TABLET | Freq: Every evening | ORAL | 0 refills | Status: DC

## 2020-06-08 NOTE — Care Management (Signed)
RW ordered for delivery of room prior to d/c.

## 2020-06-08 NOTE — Progress Notes (Signed)
Reviewed home d/c instructions with patient. Son will be picking her up. She will d/c via Wheelchair. Stable at discharge.

## 2020-06-08 NOTE — Plan of Care (Signed)
Patient ready for d/c home.

## 2020-06-08 NOTE — Evaluation (Signed)
Physical Therapy Evaluation Patient Details Name: Alexandria Wallace MRN: [id number] DOB: 08-14-1941 Today's Date: 06/08/2020   History of Present Illness  79 year old female was brought to the emergency department with complaint of nausea vomiting headache, photophobia and neck pain since this morning associated with chills.  She was found to have lactic acidosis and was hypotensive requiring vasopressors after IV fluid resuscitation.    Clinical Impression  Pt admitted with above. Pt very motivated to return home alone. Pt amb with short, shuffled steps, with trunk flexed and guarded posture. Pt given RW and demod significant improvement in step height, length, upright trunk and fluidity. Although hesitant to use RW pt did agree to feeling more comfortable walking with a RW. Educated pt that it would be good to build up endurance to walk longer distances with RW not necessarily in the home. Pt agreed and appreciated PT services.    Follow Up Recommendations No PT follow up;Supervision/Assistance - 24 hour (inititally)    Equipment Recommendations  Rolling walker with 5" wheels    Recommendations for Other Services       Precautions / Restrictions Precautions Precautions: Fall Restrictions Weight Bearing Restrictions: No      Mobility  Bed Mobility               General bed mobility comments: pt up in chair upon PT arrival  Transfers Overall transfer level: Needs assistance Equipment used: None Transfers: Sit to/from Stand Sit to Stand: Min guard         General transfer comment: slow and guarded but good technique, no LOB  Ambulation/Gait Ambulation/Gait assistance: Min guard;Supervision Gait Distance (Feet): 225 Feet (x1, 60x1) Assistive device: None;1 person hand held assist;Rolling walker (2 wheeled) Gait Pattern/deviations: Shuffle;Decreased stride length;Narrow base of support Gait velocity: dec   General Gait Details: pt initially amb without AD, pt with  shuffled gait pattern and flexed trunk very guarded, pt given HHA on L UE , pt with improved upright trunk but minimal foot clearance despite verbal cues. Pt then given RW, pt with upright trunk and fluid gait pattern with bilat foot clearance with ultimately improved stability and pace. Pt agreed that amb with RW was more comfortable however remains very reluctant to use it  Stairs Stairs: Yes Stairs assistance: Min guard Stair Management: One rail Left Number of Stairs: 13 General stair comments: min guard via HHA, no difficulty, completely reciprocally  Wheelchair Mobility    Modified Rankin (Stroke Patients Only)       Balance Overall balance assessment: Mild deficits observed, not formally tested                                           Pertinent Vitals/Pain Pain Assessment: No/denies pain    Home Living Family/patient expects to be discharged to:: Private residence Living Arrangements: Alone Available Help at Discharge: Family;Available 24 hours/day (does have daughter coming in 7/10 to stay a few days) Type of Home:  (condo) Home Access: Stairs to enter Entrance Stairs-Rails: Left Entrance Stairs-Number of Steps: 14 Home Layout: One level Home Equipment: None      Prior Function Level of Independence: Independent         Comments: does her grocery shopping, drives, cooks, cleans, Chiropodist Dominance   Dominant Hand: Right    Extremity/Trunk Assessment   Upper Extremity Assessment Upper Extremity Assessment: Generalized  weakness    Lower Extremity Assessment Lower Extremity Assessment: Generalized weakness    Cervical / Trunk Assessment Cervical / Trunk Assessment: Kyphotic  Communication   Communication: No difficulties  Cognition Arousal/Alertness: Awake/alert Behavior During Therapy: WFL for tasks assessed/performed Overall Cognitive Status: Within Functional Limits for tasks assessed                                         General Comments General comments (skin integrity, edema, etc.): VSS    Exercises     Assessment/Plan    PT Assessment Patient needs continued PT services  PT Problem List Decreased strength;Decreased activity tolerance;Decreased balance;Decreased mobility       PT Treatment Interventions DME instruction;Gait training;Stair training;Functional mobility training;Therapeutic activities;Therapeutic exercise;Balance training;Neuromuscular re-education    PT Goals (Current goals can be found in the Care Plan section)  Acute Rehab PT Goals Patient Stated Goal: home asap PT Goal Formulation: With patient Time For Goal Achievement: 06/22/20 Potential to Achieve Goals: Good    Frequency Min 2X/week   Barriers to discharge        Co-evaluation               AM-PAC PT "6 Clicks" Mobility  Outcome Measure Help needed turning from your back to your side while in a flat bed without using bedrails?: None Help needed moving from lying on your back to sitting on the side of a flat bed without using bedrails?: None Help needed moving to and from a bed to a chair (including a wheelchair)?: None Help needed standing up from a chair using your arms (e.g., wheelchair or bedside chair)?: A Little Help needed to walk in hospital room?: A Little Help needed climbing 3-5 steps with a railing? : A Little 6 Click Score: 21    End of Session   Activity Tolerance: Patient tolerated treatment well Patient left: in chair;with call bell/phone within reach Nurse Communication: Mobility status PT Visit Diagnosis: Unsteadiness on feet (R26.81)    Time: 8832-5498 PT Time Calculation (min) (ACUTE ONLY): 24 min   Charges:   PT Evaluation $PT Eval Moderate Complexity: 1 Mod PT Treatments $Gait Training: 8-22 mins        Kittie Plater, PT, DPT Acute Rehabilitation Services Pager #: [phone] Office #: [phone]   Berline Lopes 06/08/2020, 2:09 PM

## 2020-06-08 NOTE — Progress Notes (Signed)
[facility]enterology Progress Note  Alexandria Wallace 79 y.o. June 24, 1941   Subjective: Nausea has resolved. Denies vomiting or heartburn. Tolerating solid food. Occasional epigastric pain.   Objective: Vital signs: Vitals:   06/08/20 0339 06/08/20 0829  BP: 120/78 105/70  Pulse: 75 68  Resp: 18 17  Temp: 98.6 F (37 C) (!) 97.5 F (36.4 C)  SpO2: 94% 96%    Physical Exam: Gen: alert, no acute distress, elderly  HEENT: anicteric sclera CV: RRR Chest: CTA B Abd: soft, nontender, nondistended, +BS Ext: no edema  Lab Results: Recent Labs    06/07/20 0454 06/08/20 0842  NA 141 140  K 3.8 4.0  CL 111 109  CO2 24 24  GLUCOSE 107* 149*  BUN 13 9  CREATININE 0.90 0.88  CALCIUM 7.9* 8.8*  MG 1.9 1.7  PHOS 2.0* 2.2*   Recent Labs    06/05/20 1422  AST 46*  ALT 38  ALKPHOS 51  BILITOT 0.5  PROT 4.8*  ALBUMIN 2.5*   Recent Labs    06/07/20 0454 06/08/20 0842  WBC 6.0 4.8  NEUTROABS  --  3.2  HGB 10.2* 10.4*  HCT 30.4* 31.5*  MCV 99.7 101.3*  PLT 183 165      Assessment/Plan: Dyspepsia improved with PPI and antiemetics. EGD not needed at this time. Ok to go home on PPI PO BID and antiemetics and f/u with Dr. Alessandra Wallace (her primary GI) in 3-4 weeks. Will sign off. Call if questions. Dr. [place] Wallace aware.   Alexandria Wallace 06/08/2020, 1:56 PM  Questions please call [phone] ID: Alexandria Wallace, female   DOB: [birth year], 79 y.o.   MRN: [id number]

## 2020-06-08 NOTE — Progress Notes (Signed)
PROGRESS NOTE  SOLASH TULLO QIW:[id number] DOB: [birth year] DOA: 06/05/2020 PCP: Cari Caraway, MD  HPI/Recap of past 24 hours: Alexandria Wallace is a 79 y.o. female who has a PMH including but not limited to mood disorder, HLD.  She presented to [facility] ED 7/7 with nausea, vomiting, headache.  She was in her usual state of health when she went to bed the night prior.  After waking up and experiencing symptoms, she later developed posterior headache with photophobia.  Denies any lightheadedness, nuchal rigidity, syncope, fevers/chills/sweats, chest pain, dyspnea, myalgias, rashes, exposures to known sick contacts.  In ED, she was hypotensive and somewhat altered.  Head CT was negative.  Due to constellation of symptoms, LP was performed in ED and she was started on empiric vanc / cefepime.  Due to persistent hypotension, CVL was placed and she was started on norepinephrine.  CT abd / pelv also ordered to r/o intraabdominal process.  06/08/20: Seen and examined.  States her abdominal pain is improved.  Denies any nausea at the time of this exam.  She would like to go home soon.  Assessment/Plan: Active Problems:   Septic shock (HCC)   Metabolic acidosis, increased anion gap   Elevated troponin   Nausea   Nausea and vomiting   Hypotension  Shock, resolved, of unclear etiology Presented with initial concern for septic 2/2 meningitis but doubtful.  CSF unrevealing and cultures and gram stain no growth to date.  Favor hypovolemic shock at this point. Vital signs are stable this morning, not on pressor or IV fluid.  Elevated troponin likely demand ischemia  Seen by cardiology, suspected due to hypotension.  No further work-up planned Denies any anginal symptoms at the time of this exam Troponin S peaked at 1500  Nausea with vomiting - CT unrevealing.  No nausea or vomiting this morning. Improved with IV antiemetics  Continue Zofran and Reglan Seen by GI with further  recommendation Appreciate GIs assistance  GERD Continue PPI Currently on IV Protonix 40 mg twice daily Switch to oral Protonix 40 mg twice daily.  Mild macrocytic anemia. Hemoglobin is stable and trending up to 10.4 with MCV of 101.3. No overt bleeding  HLD. - Continue home ASA, CoQ10, niacin, zetia.  Mood disorder. - Continue home buspirone, fluoxetine.  Constipation Had a bowel movement yesterday Continue bowel regimen  Best Practice:  Diet: Heart healthy. DVT prophylaxis: SCD's /subcu Lovenox daily GI prophylaxis: IV Protonix 40 mg twice daily, switch to oral Glucose control: Insulin sliding scale Mobility: Bedrest.  PT to assess Code Status: Full. Family Communication: None at bedside.    Status is: Inpatient    Dispo:  Patient From: Home  Planned Disposition: [place]  Expected discharge date: 06/10/20  Medically stable for discharge: No.  Ongoing management directed by GI.         Objective: Vitals:   06/07/20 2136 06/08/20 0339 06/08/20 0500 06/08/20 0829  BP: 116/61 120/78  105/70  Pulse: 70 75  68  Resp: 16 18  17   Temp: 98.1 F (36.7 C) 98.6 F (37 C)  (!) 97.5 F (36.4 C)  TempSrc: Oral Oral  Oral  SpO2: 96% 94%  96%  Weight:   72.2 kg     Intake/Output Summary (Last 24 hours) at 06/08/2020 1236 Last data filed at 06/08/2020 0958 Gross per 24 hour  Intake 1000.59 ml  Output --  Net 1000.59 ml   Filed Weights   06/06/20 0428 06/07/20 0500 06/08/20 0500  Weight: 67.3 kg 71.6 kg 72.2 kg    Exam:  . General: 79 y.o. year-old female well developed well nourished in no acute distress.  Alert and oriented x3. . Cardiovascular: Regular rate and rhythm with no rubs or gallops.  No thyromegaly or JVD noted.   Marland Kitchen Respiratory: Clear to auscultation with no wheezes or rales. Good inspiratory effort. . Abdomen: Soft nontender nondistended with normal bowel sounds x4 quadrants. . Musculoskeletal: No lower extremity edema  bilaterally . Psychiatry: Mood is appropriate for condition and setting   Data Reviewed: CBC: Recent Labs  Lab 06/05/20 1208 06/06/20 0429 06/07/20 0454 06/08/20 0842  WBC 13.1* 20.3* 6.0 4.8  NEUTROABS 10.9*  --   --  3.2  HGB 10.4* 11.3* 10.2* 10.4*  HCT 32.6* 33.9* 30.4* 31.5*  MCV 106.9* 103.0* 99.7 101.3*  PLT 214 272 183 622   Basic Metabolic Panel: Recent Labs  Lab 06/05/20 1208 06/05/20 1258 06/06/20 0228 06/07/20 0454 06/08/20 0842  NA 143  --  140 141 140  K 3.8  --  4.4 3.8 4.0  CL 111  --  110 111 109  CO2 14*  --  17* 24 24  GLUCOSE 93  --  223* 107* 149*  BUN 14  --  20 13 9   CREATININE 1.66*  --  1.27* 0.90 0.88  CALCIUM 7.7*  --  7.7* 7.9* 8.8*  MG  --  1.3* 1.8 1.9 1.7  PHOS  --   --  3.0 2.0* 2.2*   GFR: Estimated Creatinine Clearance: 52.1 mL/min (by C-G formula based on SCr of 0.88 mg/dL). Liver Function Tests: Recent Labs  Lab 06/05/20 1422  AST 46*  ALT 38  ALKPHOS 51  BILITOT 0.5  PROT 4.8*  ALBUMIN 2.5*   Recent Labs  Lab 06/05/20 1422  LIPASE 21   No results for input(s): AMMONIA in the last 168 hours. Coagulation Profile: No results for input(s): INR, PROTIME in the last 168 hours. Cardiac Enzymes: No results for input(s): CKTOTAL, CKMB, CKMBINDEX, TROPONINI in the last 168 hours. BNP (last 3 results) No results for input(s): PROBNP in the last 8760 hours. HbA1C: Recent Labs    06/06/20 0429  HGBA1C 5.9*   CBG: Recent Labs  Lab 06/07/20 1953 06/07/20 2331 06/08/20 0342 06/08/20 0759 06/08/20 1156  GLUCAP 150* 124* 86 164* 96   Lipid Profile: No results for input(s): CHOL, HDL, LDLCALC, TRIG, CHOLHDL, LDLDIRECT in the last 72 hours. Thyroid Function Tests: No results for input(s): TSH, T4TOTAL, FREET4, T3FREE, THYROIDAB in the last 72 hours. Anemia Panel: No results for input(s): VITAMINB12, FOLATE, FERRITIN, TIBC, IRON, RETICCTPCT in the last 72 hours. Urine analysis:    Component Value Date/Time    COLORURINE YELLOW 06/05/2020 1444   APPEARANCEUR CLEAR 06/05/2020 1444   LABSPEC 1.016 06/05/2020 1444   PHURINE 5.0 06/05/2020 1444   GLUCOSEU NEGATIVE 06/05/2020 1444   HGBUR SMALL (A) 06/05/2020 1444   BILIRUBINUR NEGATIVE 06/05/2020 1444   KETONESUR 20 (A) 06/05/2020 1444   PROTEINUR 30 (A) 06/05/2020 1444   NITRITE NEGATIVE 06/05/2020 1444   LEUKOCYTESUR SMALL (A) 06/05/2020 1444   Sepsis Labs: @LABRCNTIP (procalcitonin:4,lacticidven:4)  ) Recent Results (from the past 240 hour(s))  Blood culture (routine x 2)     Status: None (Preliminary result)   Collection Time: 06/05/20 12:59 PM   Specimen: BLOOD  Result Value Ref Range Status   Specimen Description BLOOD SITE NOT SPECIFIED  Final   Special Requests   Final  BOTTLES DRAWN AEROBIC ONLY Blood Culture adequate volume   Culture   Final    NO GROWTH 2 DAYS Performed at [facility], [place] [address], [place], [place] [address]    Report Status PENDING  Incomplete  Blood culture (routine x 2)     Status: None (Preliminary result)   Collection Time: 06/05/20  2:44 PM   Specimen: BLOOD  Result Value Ref Range Status   Specimen Description BLOOD  Final   Special Requests BOTTLES DRAWN AEROBIC AND ANAEROBIC RT IJ BCAV  Final   Culture   Final    NO GROWTH 2 DAYS Performed at [facility], [place] [address] [place], [place] [address]    Report Status PENDING  Incomplete  SARS Coronavirus 2 by RT PCR (hospital order, performed in [facility] hospital lab) Nasopharyngeal Nasopharyngeal Swab     Status: None   Collection Time: 06/05/20  2:44 PM   Specimen: Nasopharyngeal Swab  Result Value Ref Range Status   SARS Coronavirus 2 NEGATIVE NEGATIVE Final    Comment: (NOTE) SARS-CoV-2 target nucleic acids are NOT DETECTED.  The SARS-CoV-2 RNA is generally detectable in upper and lower respiratory specimens during the acute phase of infection. The lowest concentration of SARS-CoV-2 viral copies this assay can detect  is 250 copies / mL. A negative result does not preclude SARS-CoV-2 infection and should not be used as the sole basis for treatment or other patient management decisions.  A negative result may occur with improper specimen collection / handling, submission of specimen other than nasopharyngeal swab, presence of viral mutation(s) within the areas targeted by this assay, and inadequate number of viral copies (<250 copies / mL). A negative result must be combined with clinical observations, patient history, and epidemiological information.  Fact Sheet for Patients:   StrictlyIdeas.no  Fact Sheet for Healthcare Providers: BankingDealers.co.za  This test is not yet approved or  cleared by the [place] FDA and has been authorized for detection and/or diagnosis of SARS-CoV-2 by FDA under an Emergency Use Authorization (EUA).  This EUA will remain in effect (meaning this test can be used) for the duration of the COVID-19 declaration under Section 564(b)(1) of the Act, 21 U.S.C. section 360bbb-3(b)(1), unless the authorization is terminated or revoked sooner.  Performed at [facility], [place] [address], [place], [place] [address]   CSF culture     Status: None (Preliminary result)   Collection Time: 06/05/20  3:58 PM   Specimen: CSF; Cerebrospinal Fluid  Result Value Ref Range Status   Specimen Description CSF  Final   Special Requests NONE  Final   Gram Stain NO WBC SEEN NO ORGANISMS SEEN CYTOSPIN SMEAR   Final   Culture   Final    NO GROWTH 3 DAYS Performed at [facility], [place] [address], [place], [place] [address]    Report Status PENDING  Incomplete  MRSA PCR Screening     Status: None   Collection Time: 06/05/20  6:48 PM   Specimen: Nasal Mucosa; Nasopharyngeal  Result Value Ref Range Status   MRSA by PCR NEGATIVE NEGATIVE Final    Comment:        The GeneXpert MRSA Assay (FDA approved for NASAL  specimens only), is one component of a comprehensive MRSA colonization surveillance program. It is not intended to diagnose MRSA infection nor to guide or monitor treatment for MRSA infections. Performed at [facility], [place] [address] [place], [place]  [address]       Studies: No results found.  Scheduled Meds: . vitamin C  1,000 mg Oral QPM  . aspirin  325 mg Oral Daily  . busPIRone  10 mg Oral BID  . Chlorhexidine Gluconate Cloth  6 each Topical Daily  . cholecalciferol  3,000 Units Oral Daily  . enoxaparin (LOVENOX) injection  40 mg Subcutaneous Daily  . ezetimibe  10 mg Oral QPM  . feeding supplement (ENSURE ENLIVE)  237 mL Oral BID BM  . FLUoxetine  10 mg Oral Daily  . insulin aspart  0-15 Units Subcutaneous Q4H  . metoCLOPramide (REGLAN) injection  5 mg Intravenous Q8H  . multivitamin with minerals  1 tablet Oral QPM  . niacin  500 mg Oral TID WC  . omega-3 acid ethyl esters  1 g Oral QHS  . pantoprazole (PROTONIX) IV  40 mg Intravenous Q12H  . vitamin B-12  1,000 mcg Oral Daily    Continuous Infusions: . ceFEPime (MAXIPIME) IV 200 mL/hr at 06/08/20 0302     LOS: 3 days     Kayleen Memos, MD [facility] Pager [phone]  If 7PM-7AM, please contact night-coverage [email or web address] Password [facility] 06/08/2020, 12:36 PM

## 2020-06-08 NOTE — Discharge Instructions (Signed)
Dehydration, Adult Dehydration is condition in which there is not enough water or other fluids in the body. This happens when a person loses more fluids than he or she takes in. Important body parts cannot work right without the right amount of fluids. Any loss of fluids from the body can cause dehydration. Dehydration can be mild, worse, or very bad. It should be treated right away to keep it from getting very bad. What are the causes? This condition may be caused by:  Conditions that cause loss of water or other fluids, such as: ? Watery poop (diarrhea). ? Vomiting. ? Sweating a lot. ? Peeing (urinating) a lot.  Not drinking enough fluids, especially when you: ? Are ill. ? Are doing things that take a lot of energy to do.  Other illnesses and conditions, such as fever or infection.  Certain medicines, such as medicines that take extra fluid out of the body (diuretics).  Lack of safe drinking water.  Not being able to get enough water and food. What increases the risk? The following factors may make you more likely to develop this condition:  Having a long-term (chronic) illness that has not been treated the right way, such as: ? Diabetes. ? Heart disease. ? Kidney disease.  Being 65 years of age or older.  Having a disability.  Living in a place that is high above the ground or sea (high in altitude). The thinner, dried air causes more fluid loss.  Doing exercises that put stress on your body for a long time. What are the signs or symptoms? Symptoms of dehydration depend on how bad it is. Mild or worse dehydration  Thirst.  Dry lips or dry mouth.  Feeling dizzy or light-headed, especially when you stand up from sitting.  Muscle cramps.  Your body making: ? Dark pee (urine). Pee may be the color of tea. ? Less pee than normal. ? Less tears than normal.  Headache. Very bad dehydration  Changes in skin. Skin may: ? Be cold to the touch (clammy). ? Be blotchy  or pale. ? Not go back to normal right after you lightly pinch it and let it go.  Little or no tears, pee, or sweat.  Changes in vital signs, such as: ? Fast breathing. ? Low blood pressure. ? Weak pulse. ? Pulse that is more than 100 beats a minute when you are sitting still.  Other changes, such as: ? Feeling very thirsty. ? Eyes that look hollow (sunken). ? Cold hands and feet. ? Being mixed up (confused). ? Being very tired (lethargic) or having trouble waking from sleep. ? Short-term weight loss. ? Loss of consciousness. How is this treated? Treatment for this condition depends on how bad it is. Treatment should start right away. Do not wait until your condition gets very bad. Very bad dehydration is an emergency. You will need to go to a hospital.  Mild or worse dehydration can be treated at home. You may be asked to: ? Drink more fluids. ? Drink an oral rehydration solution (ORS). This drink helps get the right amounts of fluids and salts and minerals in the blood (electrolytes).  Very bad dehydration can be treated: ? With fluids through an IV tube. ? By getting normal levels of salts and minerals in your blood. This is often done by giving salts and minerals through a tube. The tube is passed through your nose and into your stomach. ? By treating the root cause. Follow these instructions at   home: Oral rehydration solution If told by your doctor, drink an ORS:  Make an ORS. Use instructions on the package.  Start by drinking small amounts, about  cup (120 mL) every 5-10 minutes.  Slowly drink more until you have had the amount that your doctor said to have. Eating and drinking         Drink enough clear fluid to keep your pee pale yellow. If you were told to drink an ORS, finish the ORS first. Then, start slowly drinking other clear fluids. Drink fluids such as: ? Water. Do not drink only water. Doing that can make the salt (sodium) level in your body get too  low. ? Water from ice chips you suck on. ? Fruit juice that you have added water to (diluted). ? Low-calorie sports drinks.  Eat foods that have the right amounts of salts and minerals, such as: ? Bananas. ? Oranges. ? Potatoes. ? Tomatoes. ? Spinach.  Do not drink alcohol.  Avoid: ? Drinks that have a lot of sugar. These include:  High-calorie sports drinks.  Fruit juice that you did not add water to.  Soda.  Caffeine. ? Foods that are greasy or have a lot of fat or sugar. General instructions  Take over-the-counter and prescription medicines only as told by your doctor.  Do not take salt tablets. Doing that can make the salt level in your body get too high.  Return to your normal activities as told by your doctor. Ask your doctor what activities are safe for you.  Keep all follow-up visits as told by your doctor. This is important. Contact a doctor if:  You have pain in your belly (abdomen) and the pain: ? Gets worse. ? Stays in one place.  You have a rash.  You have a stiff neck.  You get angry or annoyed (irritable) more easily than normal.  You are more tired or have a harder time waking than normal.  You feel: ? Weak or dizzy. ? Very thirsty. Get help right away if you have:  Any symptoms of very bad dehydration.  Symptoms of vomiting, such as: ? You cannot eat or drink without vomiting. ? Your vomiting gets worse or does not go away. ? Your vomit has blood or green stuff in it.  Symptoms that get worse with treatment.  A fever.  A very bad headache.  Problems with peeing or pooping (having a bowel movement), such as: ? Watery poop that gets worse or does not go away. ? Blood in your poop (stool). This may cause poop to look black and tarry. ? Not peeing in 6-8 hours. ? Peeing only a small amount of very dark pee in 6-8 hours.  Trouble breathing. These symptoms may be an emergency. Do not wait to see if the symptoms will go away. Get  medical help right away. Call your local emergency services (911 in the [place] Do not drive yourself to the hospital. Summary  Dehydration is a condition in which there is not enough water or other fluids in the body. This happens when a person loses more fluids than he or she takes in.  Treatment for this condition depends on how bad it is. Treatment should be started right away. Do not wait until your condition gets very bad.  Drink enough clear fluid to keep your pee pale yellow. If you were told to drink an oral rehydration solution (ORS), finish the ORS first. Then, start slowly drinking other clear fluids.  Take over-the-counter and prescription medicines only as told by your doctor.  Get help right away if you have any symptoms of very bad dehydration. This information is not intended to replace advice given to you by your health care provider. Make sure you discuss any questions you have with your health care provider. Document Revised: 06/29/2019 Document Reviewed: 06/29/2019 Else[employer] Patient Education  [place]. Nausea, Adult Nausea is feeling sick to your stomach or feeling that you are about to throw up (vomit). Feeling sick to your stomach is usually not serious, but it may be an early sign of a more serious medical problem. As you feel sicker to your stomach, you may throw up. If you throw up, or if you are not able to drink enough fluids, there is a risk that you may lose too much water in your body (get dehydrated). If you lose too much water in your body, you may:  Feel tired.  Feel thirsty.  Have a dry mouth.  Have cracked lips.  Go pee (urinate) less often. Older adults and people who have other diseases or a weak body defense system (immune system) have a higher risk of losing too much water in the body. The main goals of treating this condition are:  To relieve your nausea.  To ensure your nausea occurs less often.  To prevent throwing up and losing  too much fluid. Follow these instructions at home: Watch your symptoms for any changes. Tell your doctor about them. Follow these instructions as told by your doctor. Eating and drinking      Take an ORS (oral rehydration solution). This is a drink that is sold at pharmacies and stores.  Drink clear fluids in small amounts as you are able. These include: ? Water. ? Ice chips. ? Fruit juice that has water added (diluted fruit juice). ? Low-calorie sports drinks.  Eat bland, easy-to-digest foods in small amounts as you are able, such as: ? Bananas. ? Applesauce. ? Rice. ? Low-fat (lean) meats. ? Toast. ? Crackers.  Avoid drinking fluids that have a lot of sugar or caffeine in them. This includes energy drinks, sports drinks, and soda.  Avoid alcohol.  Avoid spicy or fatty foods. General instructions  Take over-the-counter and prescription medicines only as told by your doctor.  Rest at home while you get better.  Drink enough fluid to keep your pee (urine) pale yellow.  Take slow and deep breaths when you feel sick to your stomach.  Avoid food or things that have strong smells.  Wash your hands often with soap and water. If you cannot use soap and water, use hand sanitizer.  Make sure that all people in your home wash their hands well and often.  Keep all follow-up visits as told by your doctor. This is important. Contact a doctor if:  You feel sicker to your stomach.  You feel sick to your stomach for more than 2 days.  You throw up.  You are not able to drink fluids without throwing up.  You have new symptoms.  You have a fever.  You have a headache.  You have muscle cramps.  You have a rash.  You have pain while peeing.  You feel light-headed or dizzy. Get help right away if:  You have pain in your chest, neck, arm, or jaw.  You feel very weak or you pass out (faint).  You have throw up that is bright red or looks like coffee grounds.  You  have bloody or black poop (stools) or poop that looks like tar.  You have a very bad headache, a stiff neck, or both.  You have very bad pain, cramping, or bloating in your belly (abdomen).  You have trouble breathing or you are breathing very quickly.  Your heart is beating very quickly.  Your skin feels cold and clammy.  You feel confused.  You have signs of losing too much water in your body, such as: ? Dark pee, very little pee, or no pee. ? Cracked lips. ? Dry mouth. ? Sunken eyes. ? Sleepiness. ? Weakness. These symptoms may be an emergency. Do not wait to see if the symptoms will go away. Get medical help right away. Call your local emergency services (911 in the [place] Do not drive yourself to the hospital. Summary  Nausea is feeling sick to your stomach or feeling that you are about to throw up (vomit).  If you throw up, or if you are not able to drink enough fluids, there is a risk that you may lose too much water in your body (get dehydrated).  Eat and drink what your doctor tells you. Take over-the-counter and prescription medicines only as told by your doctor.  Contact a doctor right away if your symptoms get worse or you have new symptoms.  Keep all follow-up visits as told by your doctor. This is important. This information is not intended to replace advice given to you by your health care provider. Make sure you discuss any questions you have with your health care provider. Document Revised: 04/26/2018 Document Reviewed: 04/26/2018 Else[employer] Patient Education  [place].

## 2020-06-08 NOTE — Discharge Summary (Addendum)
Discharge Summary  Alexandria Wallace YSA:[id number] DOB: [birth year]  PCP: Cari Caraway, MD  Admit date: 06/05/2020 Discharge date: 06/08/2020  Time spent: 35 minutes  Recommendations for Outpatient Follow-up:  1. Follow-up with your gastroenterologist Dr. Alessandra Bevels (her primary GI) in 3-4 weeks. 2. Follow-up with cardiology within a week 3. Follow-up with your PCP within a week 4. Take your medications as prescribed 5. Continue fall precautions  Discharge Diagnoses:  Active Hospital Problems   Diagnosis Date Noted  . Hypotension   . Elevated troponin   . Nausea   . Nausea and vomiting   . Septic shock ([place]) 06/05/2020  . Metabolic acidosis, increased anion gap     Resolved Hospital Problems  No resolved problems to display.    Discharge Condition: Stable  Diet recommendation: Resume previous diet  Vitals:   06/08/20 0339 06/08/20 0829  BP: 120/78 105/70  Pulse: 75 68  Resp: 18 17  Temp: 98.6 F (37 C) (!) 97.5 F (36.4 C)  SpO2: 94% 96%    History of present illness:  Alexandria Cervantez Honeycuttis a 79 y.o.femalewho has a PMH including but not limited to mood disorder, HLD. Shepresented to [facility] ED 7/7 with nausea, vomiting, headache. She was in her usual state of health when she went to bed the night prior. After waking up and experiencing symptoms, she later developed posterior headache with photophobia. Denies any lightheadedness, nuchal rigidity, syncope, fevers/chills/sweats, chest pain, dyspnea, myalgias, rashes, exposures to known sick contacts.  In ED, she was hypotensive and somewhat altered. Head CT was negative. Due to constellation of symptoms, LP was performed in ED and she was started on empiric vanc / cefepime. Due to persistent hypotension, CVL was placed and she was started on norepinephrine. CT abd / pelv also ordered to r/o intraabdominal process.  06/08/20: Seen and examined.  States her abdominal pain and nausea are improved.  Denies any  nausea or abdominal pain at the time of this exam.    Also denies any chest pain or dyspnea.  She is adamant about going home today.  Seen by GI, Dr. Michail Sermon, cleared to be discharged from a GI standpoint.    Hospital Course:  Active Problems:   Septic shock (HCC)   Metabolic acidosis, increased anion gap   Elevated troponin   Nausea   Nausea and vomiting   Hypotension  Shock, resolved, of unclear etiology Presented with initial concern for septic 2/2 meningitis but doubtful with negative cultures. CSF unrevealing and cultures and gram stain no growth to date. Favor hypovolemic shock at this point.  Retrospectively, it was thought that significant vomiting with fluid loss caused her hypotension. Vital signs are stable this morning, not on vasopressors or IV fluid. Denies any nausea, vomiting, abdominal pain, chest pain, dyspnea, or neurological symptoms.  She is adamant about going home.  Elevated troponin likely demand ischemia  Seen by cardiology, suspected due to her hypotension, now resolved.  No further work-up planned Denies any anginal symptoms at the time of this exam Troponin S peaked at 1500. Follow-up with cardiology outpatient  Resolved nausea with vomiting - CT unrevealing except for constipation.  No nausea or vomiting this morning. Seen by GI, Dr. Michail Sermon, recommended to discharge with p.o. Protonix 40 mg twice daily and oral antiemetics as needed. She will follow up with her gastroenterologist Dr.Brahmbhatt (her primary GI) in 3-4 weeks.   GERD Stable Continue PPI twice daily Received IV Protonix 40 mg twice daily Switched to oral Protonix 40 mg twice  daily.  Mild macrocytic anemia. Hemoglobin is stable and trending up to 10.4 with MCV of 101.3. No overt bleeding Follow-up with your PCP  HLD. - Continue home regimen Follow-up with your PCP  Mood disorder. - Continue home buspirone, fluoxetine. Follow-up with your PCP  Constipation Had a  bowel movement yesterday Continue bowel regimen Follow-up with your PCP and GI.  Physical debility PT assessment recommended no further PT follow-up Recommended rolling walker with 5 inch wheels Continue fall precautions     Discharge Exam: BP 105/70 (BP Location: Right Arm)   Pulse 68   Temp (!) 97.5 F (36.4 C) (Oral)   Resp 17   Wt 72.2 kg   SpO2 96%   BMI 27.32 kg/m  . General: 79 y.o. year-old female well developed well nourished in no acute distress.  Alert and oriented x3. . Cardiovascular: Regular rate and rhythm with no rubs or gallops.  No thyromegaly or JVD noted.   Marland Kitchen Respiratory: Clear to auscultation with no wheezes or rales. Good inspiratory effort. . Abdomen: Soft nontender nondistended with normal bowel sounds x4 quadrants. . Musculoskeletal: No lower extremity edema. 2/4 pulses in all 4 extremities. Marland Kitchen Psychiatry: Mood is appropriate for condition and setting  Discharge Instructions You were cared for by a hospitalist during your hospital stay. If you have any questions about your discharge medications or the care you received while you were in the hospital after you are discharged, you can call the unit and asked to speak with the hospitalist on call if the hospitalist that took care of you is not available. Once you are discharged, your primary care physician will handle any further medical issues. Please note that NO REFILLS for any discharge medications will be authorized once you are discharged, as it is imperative that you return to your primary care physician (or establish a relationship with a primary care physician if you do not have one) for your aftercare needs so that they can reassess your need for medications and monitor your lab values.   Allergies as of 06/08/2020      Reactions   Chocolate    Stated by patient from blood work      Medication List    STOP taking these medications   ibuprofen 200 MG tablet Commonly known as: ADVIL     TAKE  these medications   acetaminophen 500 MG tablet Commonly known as: TYLENOL Take 500 mg by mouth every 6 (six) hours as needed (for pain.).   aspirin 325 MG tablet Take 1 tablet (325 mg total) by mouth daily. Start taking on: June 09, 2020   busPIRone 10 MG tablet Commonly known as: BUSPAR Take 10 mg by mouth daily.   Cerefolin NAC 6-90.314-2-600 MG Tabs Take 1 tablet by mouth 2 (two) times daily.   Co Q10 100 MG Caps Take 100 mg by mouth every evening.   feeding supplement (ENSURE ENLIVE) Liqd Take 237 mLs by mouth 2 (two) times daily between meals for 7 days. Start taking on: June 09, 2020   Fish Oil 1000 MG Caps Take 1,000 mg by mouth at bedtime.   FLUoxetine 10 MG capsule Commonly known as: PROZAC Take 10 mg by mouth daily.   magnesium oxide 400 (241.3 Mg) MG tablet Commonly known as: MAG-OX Take 1 tablet (400 mg total) by mouth daily.   multivitamin with minerals Tabs tablet Take 1 tablet by mouth every evening.   niacin 500 MG tablet Take 500 mg by mouth 3 (three)  times daily with meals.   ondansetron 4 MG disintegrating tablet Commonly known as: Zofran ODT Take 1 tablet (4 mg total) by mouth every 8 (eight) hours as needed for nausea or vomiting.   pantoprazole 40 MG tablet Commonly known as: PROTONIX Take 1 tablet (40 mg total) by mouth 2 (two) times daily.   polyethylene glycol 17 g packet Commonly known as: MiraLax Take 17 g by mouth daily as needed.   TURMERIC PO Take 448 mg by mouth every evening.   vitamin B-12 1000 MCG tablet Commonly known as: CYANOCOBALAMIN Take 1,000 mcg by mouth daily.   vitamin C 1000 MG tablet Take 1,000 mg by mouth every evening.   Vitamin D3 75 MCG (3000 UT) Tabs Take 3,000 Units by mouth daily.   Zetia 10 MG tablet Generic drug: ezetimibe Take 1 tablet (10 mg total) by mouth every evening.            Durable Medical Equipment  (From admission, onward)         Start     Ordered   06/08/20 1456  For  home use only DME Walker rolling  Once       Question Answer Comment  Walker: With 5 Inch Wheels   Patient needs a walker to treat with the following condition Ambulatory dysfunction      06/08/20 1456         Allergies  Allergen Reactions  . Chocolate     Stated by patient from blood work    Follow-up Information    Cari Caraway, MD. Call in 1 day(s).   Specialty: Family Medicine Why: Please call for a post hospital follow-up appointment. Contact information: [place] [address] [phone]        Pixie Casino, MD .   Specialty: Cardiology Contact information: [place] [address] [phone]        Otis Brace, MD. Call in 1 day(s).   Specialty: Gastroenterology Why: Please call for a post hospital follow-up appointment. Contact information: [place] [address] [phone]                The results of significant diagnostics from this hospitalization (including imaging, microbiology, ancillary and laboratory) are listed below for reference.    Significant Diagnostic Studies: CT ABDOMEN PELVIS WO CONTRAST  Result Date: 06/05/2020 CLINICAL DATA:  Sepsis, hypotension EXAM: CT ABDOMEN AND PELVIS WITHOUT CONTRAST TECHNIQUE: Multidetector CT imaging of the abdomen and pelvis was performed following the standard protocol without IV contrast. COMPARISON:  None. FINDINGS: Lower chest: Lung bases demonstrate no acute consolidation or pleural effusion. Coronary vascular calcification. Small hiatal hernia. Hepatobiliary: No focal liver abnormality is seen. No gallstones, gallbladder wall thickening, or biliary dilatation. Pancreas: Unremarkable. No pancreatic ductal dilatation or surrounding inflammatory changes. Spleen: Normal in size without focal abnormality. Adrenals/Urinary Tract: Adrenal glands are within normal limits. Kidneys show no hydronephrosis. No ureteral stone.  The bladder is unremarkable. Stomach/Bowel: Stomach is nondistended. No dilated small bowel. Negative appendix. 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 Postsurgical changes at the rectosigmoid colon. Large retained feces at the rectum. Vascular/Lymphatic: Mild aortic atherosclerosis without aneurysm. No suspicious nodes. Reproductive: Uterus and bilateral adnexa are unremarkable. Other: No free air or free fluid. Musculoskeletal: No acute or significant osseous findings. IMPRESSION: 1. No CT evidence for acute intra-abdominal or pelvic abnormality. 2. Sigmoid colon diverticular disease without acute inflammatory change. Large retained  feces at the rectum. Aortic Atherosclerosis (ICD10-I70.0). Electronically Signed   By: Donavan Foil M.D.   On: 06/05/2020 17:43   CT Head Wo Contrast  Result Date: 06/05/2020 CLINICAL DATA:  Headache and nausea. EXAM: CT HEAD WITHOUT CONTRAST TECHNIQUE: Contiguous axial images were obtained from the base of the skull through the vertex without intravenous contrast. COMPARISON:  None. FINDINGS: Brain: The ventricles are normal in size and configuration for age. No extra-axial fluid collections are identified. The gray-white differentiation is maintained. No CT findings for acute hemispheric infarction or intracranial hemorrhage. No mass lesions. The brainstem and cerebellum are normal. Vascular: Scattered vascular calcifications. No hyperdense vessels or obvious aneurysm. Skull: No acute skull fracture.  No bone lesion. Sinuses/Orbits: The paranasal sinuses and mastoid air cells are clear. The globes are intact. Other: No scalp lesions, laceration or hematoma. IMPRESSION: No acute intracranial findings or mass lesions. Electronically Signed   By: Marijo Sanes M.D.   On: 06/05/2020 13:15   DG Chest Port 1 View  Result Date: 06/06/2020 CLINICAL DATA:  Respiratory failure. EXAM: PORTABLE CHEST 1 VIEW COMPARISON:  Chest x-ray 06/05/2020.  FINDINGS: Right IJ line stable position. Heart size stable. Mild bibasilar subsegmental atelectasis/infiltrates. No pleural effusion or pneumothorax. Degenerative changes scoliosis thoracic spine. IMPRESSION: 1.  Right IJ line stable position. 2.  Mild bibasilar subsegmental atelectasis/infiltrates. Electronically Signed   By: Marcello Moores  Register   On: 06/06/2020 06:49   DG Chest Portable 1 View  Result Date: 06/05/2020 CLINICAL DATA:  Central line placement, hypertension EXAM: PORTABLE CHEST 1 VIEW COMPARISON:  06/05/2020 at 1:20 p.m. FINDINGS: Right internal jugular central venous catheter has been placed with its tip overlying the expected superior vena cava. Lungs are clear. No pneumothorax or pleural effusion. Cardiac size within normal limits. Pulmonary vascularity is normal. IMPRESSION: Right internal jugular central venous catheter tip within the superior vena cava. No pneumothorax. Electronically Signed   By: Fidela Salisbury MD   On: 06/05/2020 15:07   DG Chest Portable 1 View  Result Date: 06/05/2020 CLINICAL DATA:  Abdominal pain and cramping. Hypotension. Possible sepsis. EXAM: PORTABLE CHEST 1 VIEW COMPARISON:  None. FINDINGS: Lungs are clear. Heart size is normal. Aortic atherosclerosis. No pneumothorax or pleural fluid. No acute or focal bony abnormality. IMPRESSION: No acute disease. Aortic Atherosclerosis (ICD10-I70.0). Electronically Signed   By: Inge Rise M.D.   On: 06/05/2020 13:48   ECHOCARDIOGRAM COMPLETE  Result Date: 06/06/2020    ECHOCARDIOGRAM REPORT   Patient Name:   Alexandria Wallace Date of Exam: 06/06/2020 Medical Rec #:  [id number]          Height:       64.0 in Accession #:    [id number]         Weight:       148.4 lb Date of Birth:  1941/08/09           BSA:          1.723 m Patient Age:    10 years           BP:           110/63 mmHg Patient Gender: F                  HR:           79 bpm. Exam Location:  Inpatient Procedure: 2D Echo, Cardiac Doppler and Color Doppler  Indications:    Shock [id number]  History:        Patient  has prior history of Echocardiogram examinations, most                 recent 12/07/2016. Risk Factors:Dyslipidemia.  Sonographer:    Jonelle Sidle Dance Referring Phys: [id number] RAHUL P DESAI IMPRESSIONS  1. Left ventricular ejection fraction, by estimation, is 60 to 65%. The left ventricle has normal function. The left ventricle has no regional wall motion abnormalities. There is mild left ventricular hypertrophy. Left ventricular diastolic parameters are consistent with Grade I diastolic dysfunction (impaired relaxation).  2. Right ventricular systolic function is normal. The right ventricular size is normal. There is mildly elevated pulmonary artery systolic pressure. The estimated right ventricular systolic pressure is 92.3 mmHg.  3. The mitral valve is degenerative. No evidence of mitral valve regurgitation. No evidence of mitral stenosis.  4. The aortic valve is tricuspid. Aortic valve regurgitation is not visualized. Mild aortic valve sclerosis is present, with no evidence of aortic valve stenosis.  5. The inferior vena cava is dilated in size with <50% respiratory variability, suggesting right atrial pressure of 15 mmHg. FINDINGS  Left Ventricle: Left ventricular ejection fraction, by estimation, is 60 to 65%. The left ventricle has normal function. The left ventricle has no regional wall motion abnormalities. The left ventricular internal cavity size was normal in size. There is  mild left ventricular hypertrophy. Left ventricular diastolic parameters are consistent with Grade I diastolic dysfunction (impaired relaxation). Right Ventricle: The right ventricular size is normal. No increase in right ventricular wall thickness. Right ventricular systolic function is normal. There is mildly elevated pulmonary artery systolic pressure. The tricuspid regurgitant velocity is 2.50  m/s, and with an assumed right atrial pressure of 15 mmHg, the estimated right  ventricular systolic pressure is 30.0 mmHg. Left Atrium: Left atrial size was normal in size. Right Atrium: Right atrial size was normal in size. Pericardium: There is no evidence of pericardial effusion. Mitral Valve: The mitral valve is degenerative in appearance. There is mild calcification of the mitral valve leaflet(s). Moderate mitral annular calcification. No evidence of mitral valve regurgitation. No evidence of mitral valve stenosis. Tricuspid Valve: The tricuspid valve is normal in structure. Tricuspid valve regurgitation is mild. Aortic Valve: The aortic valve is tricuspid. Aortic valve regurgitation is not visualized. Mild aortic valve sclerosis is present, with no evidence of aortic valve stenosis. Pulmonic Valve: The pulmonic valve was normal in structure. Pulmonic valve regurgitation is not visualized. Aorta: The aortic root is normal in size and structure. Venous: The inferior vena cava is dilated in size with less than 50% respiratory variability, suggesting right atrial pressure of 15 mmHg. IAS/Shunts: No atrial level shunt detected by color flow Doppler.  LEFT VENTRICLE PLAX 2D LVIDd:         3.10 cm  Diastology LVIDs:         2.20 cm  LV e' lateral:   8.38 cm/s LV PW:         1.10 cm  LV E/e' lateral: 15.2 LV IVS:        0.90 cm  LV e' medial:    8.16 cm/s LVOT diam:     2.10 cm  LV E/e' medial:  15.6 LV SV:         97 LV SV Index:   56 LVOT Area:     3.46 cm  RIGHT VENTRICLE             IVC RV Basal diam:  3.10 cm     IVC diam: 2.40 cm RV  Mid diam:    2.30 cm RV S prime:     21.30 cm/s TAPSE (M-mode): 2.4 cm LEFT ATRIUM             Index       RIGHT ATRIUM           Index LA diam:        3.50 cm 2.03 cm/m  RA Area:     13.90 cm LA Vol (A2C):   28.5 ml 16.54 ml/m RA Volume:   29.70 ml  17.24 ml/m LA Vol (A4C):   34.3 ml 19.90 ml/m LA Biplane Vol: 32.8 ml 19.03 ml/m  AORTIC VALVE LVOT Vmax:   141.00 cm/s LVOT Vmean:  83.800 cm/s LVOT VTI:    0.281 m  AORTA Ao Root diam: 3.10 cm Ao Asc diam:   3.10 cm MITRAL VALVE                TRICUSPID VALVE MV Area (PHT): 3.91 cm     TR Peak grad:   25.0 mmHg MV Decel Time: 194 msec     TR Vmax:        250.00 cm/s MV E velocity: 127.00 cm/s MV A velocity: 111.00 cm/s  SHUNTS MV E/A ratio:  1.14         Systemic VTI:  0.28 m                             Systemic Diam: 2.10 cm Loralie Champagne MD Electronically signed by Loralie Champagne MD Signature Date/Time: 06/06/2020/4:45:26 PM    Final     Microbiology: Recent Results (from the past 240 hour(s))  Blood culture (routine x 2)     Status: None (Preliminary result)   Collection Time: 06/05/20 12:59 PM   Specimen: BLOOD  Result Value Ref Range Status   Specimen Description BLOOD SITE NOT SPECIFIED  Final   Special Requests   Final    BOTTLES DRAWN AEROBIC ONLY Blood Culture adequate volume   Culture   Final    NO GROWTH 3 DAYS Performed at [facility], [address] [place], [place] [address]    Report Status PENDING  Incomplete  Blood culture (routine x 2)     Status: None (Preliminary result)   Collection Time: 06/05/20  2:44 PM   Specimen: BLOOD  Result Value Ref Range Status   Specimen Description BLOOD  Final   Special Requests BOTTLES DRAWN AEROBIC AND ANAEROBIC RT IJ BCAV  Final   Culture   Final    NO GROWTH 3 DAYS Performed at [facility], [place] [address] [place], [place] [address]    Report Status PENDING  Incomplete  SARS Coronavirus 2 by RT PCR (hospital order, [facility]) Nasopharyngeal Nasopharyngeal Swab     Status: None   Collection Time: 06/05/20  2:44 PM   Specimen: Nasopharyngeal Swab  Result Value Ref Range Status   SARS Coronavirus 2 NEGATIVE NEGATIVE Final    Comment: (NOTE) SARS-CoV-2 target nucleic acids are NOT DETECTED.  The SARS-CoV-2 RNA is generally detectable in upper and lower respiratory specimens during the acute phase of infection. The lowest concentration of SARS-CoV-2 viral copies this assay can detect is  250 copies / mL. A negative result does not preclude SARS-CoV-2 infection and should not be used as the sole basis for treatment or other patient management decisions.  A negative result may occur  with improper specimen collection / handling, submission of specimen other than nasopharyngeal swab, presence of viral mutation(s) within the areas targeted by this assay, and inadequate number of viral copies (<250 copies / mL). A negative result must be combined with clinical observations, patient history, and epidemiological information.  Fact Sheet for Patients:   StrictlyIdeas.no  Fact Sheet for Healthcare Providers: BankingDealers.co.za  This test is not yet approved or  cleared by the [place] FDA and has been authorized for detection and/or diagnosis of SARS-CoV-2 by FDA under an Emergency Use Authorization (EUA).  This EUA will remain in effect (meaning this test can be used) for the duration of the COVID-19 declaration under Section 564(b)(1) of the Act, 21 U.S.C. section 360bbb-3(b)(1), unless the authorization is terminated or revoked sooner.  Performed at [facility], [place] [address] [place], [place] [address]   CSF culture     Status: None (Preliminary result)   Collection Time: 06/05/20  3:58 PM   Specimen: CSF; Cerebrospinal Fluid  Result Value Ref Range Status   Specimen Description CSF  Final   Special Requests NONE  Final   Gram Stain NO WBC SEEN NO ORGANISMS SEEN CYTOSPIN SMEAR   Final   Culture   Final    NO GROWTH 3 DAYS Performed at [facility], [place] [address] [place], [place] [address]    Report Status PENDING  Incomplete  MRSA PCR Screening     Status: None   Collection Time: 06/05/20  6:48 PM   Specimen: Nasal Mucosa; Nasopharyngeal  Result Value Ref Range Status   MRSA by PCR NEGATIVE NEGATIVE Final    Comment:        The GeneXpert MRSA Assay (FDA approved for NASAL specimens only),  is one component of a comprehensive MRSA colonization surveillance program. It is not intended to diagnose MRSA infection nor to guide or monitor treatment for MRSA infections. Performed at [facility], [place] [address], [place], [place] [address]      Labs: Basic Metabolic Panel: Recent Labs  Lab 06/05/20 1208 06/05/20 1258 06/06/20 0228 06/07/20 0454 06/08/20 0842  NA 143  --  140 141 140  K 3.8  --  4.4 3.8 4.0  CL 111  --  110 111 109  CO2 14*  --  17* 24 24  GLUCOSE 93  --  223* 107* 149*  BUN 14  --  20 13 9   CREATININE 1.66*  --  1.27* 0.90 0.88  CALCIUM 7.7*  --  7.7* 7.9* 8.8*  MG  --  1.3* 1.8 1.9 1.7  PHOS  --   --  3.0 2.0* 2.2*   Liver Function Tests: Recent Labs  Lab 06/05/20 1422  AST 46*  ALT 38  ALKPHOS 51  BILITOT 0.5  PROT 4.8*  ALBUMIN 2.5*   Recent Labs  Lab 06/05/20 1422  LIPASE 21   No results for input(s): AMMONIA in the last 168 hours. CBC: Recent Labs  Lab 06/05/20 1208 06/06/20 0429 06/07/20 0454 06/08/20 0842  WBC 13.1* 20.3* 6.0 4.8  NEUTROABS 10.9*  --   --  3.2  HGB 10.4* 11.3* 10.2* 10.4*  HCT 32.6* 33.9* 30.4* 31.5*  MCV 106.9* 103.0* 99.7 101.3*  PLT 214 272 183 165   Cardiac Enzymes: No results for input(s): CKTOTAL, CKMB, CKMBINDEX, TROPONINI in the last 168 hours. BNP: BNP (last 3 results) No results for input(s): BNP in the last 8760 hours.  ProBNP (last 3 results)  No results for input(s): PROBNP in the last 8760 hours.  CBG: Recent Labs  Lab 06/07/20 1953 06/07/20 2331 06/08/20 0342 06/08/20 0759 06/08/20 1156  GLUCAP 150* 124* 86 164* 96       Signed:  Kayleen Memos, MD [facility] 06/08/2020, 3:56 PM

## 2020-06-08 NOTE — Plan of Care (Signed)

## 2020-06-09 LAB — CSF CULTURE W GRAM STAIN
Culture: NO GROWTH
Gram Stain: NONE SEEN

## 2020-06-10 LAB — CULTURE, BLOOD (ROUTINE X 2)
Culture: NO GROWTH
Culture: NO GROWTH
Special Requests: ADEQUATE

## 2020-06-11 ENCOUNTER — Ambulatory Visit: Payer: Medicare PPO | Admitting: Physical Therapy

## 2020-06-12 ENCOUNTER — Other Ambulatory Visit: Payer: Self-pay

## 2020-06-12 ENCOUNTER — Ambulatory Visit: Payer: Medicare PPO | Admitting: Internal Medicine

## 2020-06-12 ENCOUNTER — Encounter: Payer: Self-pay | Admitting: Internal Medicine

## 2020-06-12 VITALS — BP 100/60 | HR 72 | Ht 64.0 in | Wt 143.0 lb

## 2020-06-12 DIAGNOSIS — E782 Mixed hyperlipidemia: Secondary | ICD-10-CM

## 2020-06-12 DIAGNOSIS — M791 Myalgia, unspecified site: Secondary | ICD-10-CM

## 2020-06-12 DIAGNOSIS — I2510 Atherosclerotic heart disease of native coronary artery without angina pectoris: Secondary | ICD-10-CM

## 2020-06-12 DIAGNOSIS — E7849 Other hyperlipidemia: Secondary | ICD-10-CM

## 2020-06-12 DIAGNOSIS — T466X5A Adverse effect of antihyperlipidemic and antiarteriosclerotic drugs, initial encounter: Secondary | ICD-10-CM | POA: Diagnosis not present

## 2020-06-12 NOTE — Progress Notes (Signed)
OFFICE NOTE  Chief Complaint:  Follow-up hospitalization  Primary Care Physician: Cari Caraway, MD  HPI:  Alexandria Wallace is a 79 y.o. female who I previously saw in 2012 for evaluation of heart murmur. She underwent an echocardiogram at that time which showed an EF of greater than 67%, mild diastolic dysfunction, mild to moderate TR with an RVSP of 40 mmHg, and mild aortic sclerosis without stenosis. She also underwent a stress test which showed no reversible ischemia. She denies any chest pain or worsening shortness of breath. Recently she saw her primary care provider who noted a heart murmur which was not previously appreciated. She was referred for reevaluation. Alexandria Wallace reports doing well. She is quite active physically. Although she takes medications for mood disorder and has some mildly elevated cholesterol, she has no other cardiac issues that were aware of. Cholesterol is treated by her primary care provider and she is on zetia. Her mother died at age 51 of an MI but there is no other coronary disease in the family.  09/01/2018  Alexandria Wallace returns today for follow-up.  I have only seen her twice in the last 7 years.  Initially in 2012 for evaluation of heart murmur.  Echo showed some TR and mild aortic sclerosis.  I last saw her in 2017 and a repeat echo in 2018 showed mild aortic sclerosis with normal systolic function and no significant valvular abnormalities.  She mentioned that she had a history of elevated cholesterol in the past but was hesitant to take statins.  She denied any history of heart disease in her family and noted that her mother died at age 36 and did have an MRI.  Her father actually died in his 35s from cancer and is unclear whether he had any coronary disease.  I did receive lab work from her PCP at [facility], Dr. Addison Lank that indicated a severe dyslipidemia.  In fact in June 2019 her total cholesterol was 321, triglycerides 144, HDL 56 and LDL-C of 236.   Non-HDL cholesterol 265.  This is in the setting of ezetimibe.  When I press her more on this she says that actually she was given the diagnosis of a genetic dyslipidemia when she spent some time in [place].  Her daughter apparently is an Systems developer and also practices herbal medications and has convinced her that statin therapy is potentially harmful.  Alexandria Wallace was on Crestor for short period of time and apparently tolerated it with an appropriate decrease in her cholesterol.  These findings are highly suggestive of FH and certainly raise my concern that her symptoms may be due to significant coronary artery disease.  She had last had stress testing back in 2012 which was negative for ischemia.  Currently she gets short of breath when doing moderate to significant exertion, such as walking upstairs or doing water aerobics.  She also notes an associated drop in blood pressure with these episodes.  She denies specific chest pain or pressure.  10/04/2018  Alexandria Wallace returns today for follow-up of her CT angiogram.  As noted above she has a significant history of dyslipidemia with likely FH and has recently had shortness of breath with moderate to significant exertion.  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  There is evidence of collaterals from the distal LAD.  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  The LAD is noted to be a large vessel that wraps around the apex.  A PFO is suspected with left-to-right shunt.  Overall the findings could explain her symptoms however currently she is free of angina.  Her dyspnea is fairly stable and tolerated.  She is started taking Crestor but does feel that she has some side effects including cramping which is been intermittent.  She would  like to lower her dose.  She understands that she is way above her goal LDL less than 70 and will likely need additional medication.  Because of the abnormal CT findings, we discussed possible heart catheterization for definitive evaluation of her symptoms.  Although the right coronary could be occluded this appears to be a small, nondominant vessel and intervention would not likely be beneficial.  11/10/2018  Alexandria Wallace is seen today in follow-up.  This is an unscheduled visit for chest pain.  Recently as described above we had performed a CT coronary angiogram which did show either anomalous right coronary artery or possibly obstruction with collaterals that seem to come from the distal LAD.  It was unclear whether there was significant obstruction.  At that time she had been having fatigue and shortness of breath.  Now she reports her shortness of breath is worsened and she developed chest pain.  This is described as sharp and lasted for several minutes across her back and shoulders, while exerting and exercising.  Apparently she was doing some lap pull downs suggesting this could be musculoskeletal, but she has had some other episodes with exertion which were concerning.  Overall she feels unwell and thinks that her symptoms are progressively worsening.  Her EKG however is normal today.  12/16/2018  Alexandria Wallace is seen today in follow-up.  She underwent left heart catheterization for persistent chest discomfort, fatigue and shortness of breath.  Her CT coronary angiogram suggested possible diminutive right coronary artery versus ostial obstruction.  Fortunately the cath showed a diminutive right coronary with a large left circulation and minimal, nonobstructive coronary disease with normal LV function.  She had significant dyslipidemia but has been on both ezetimibe and rosuvastatin 40 mg.  She recently had a repeat lipid profile on December 08, 2018.  Total cholesterol is 146, triglycerides 98, HDL 52  and LDL 74.  This represents a marked improvement and is very near goal LDL less than 70.  06/12/2020  Ms. Witter returns today for follow-up of recent hospitalization.  She was seen in consultation on July 9 by Dr. Martinique for elevated troponin.  She was initially admitted for hypotension and elevated lactate concerning for sepsis.  There was some vomiting but not protracted.  Ultimately cultures were negative and the source of her illness was not apparent.  She had complained of headache however CSF studies were negative for meningitis.  There was significant increase in troponin to 1500 however the echo was normal with normal wall motion.  There was some mild diffuse ST depression suggesting this may have been subendocardial ischemia.  Based on her recent cath in 2019 there was minimal nonobstructive coronary disease.  It was not felt that this was a true non-STEMI.  On follow-up today she denies any chest pain or worsening shortness of breath.  She reports recent auto diuresis of fluids that she was given during her hospitalization.  She is not on a diuretic.  She has stopped her statins and had previously had good LDL at 74  but prior to that in the past has had an LDL well over 200 apparently in [place].  She has been working with an integrative medicine physician there.  She stopped the statins because of significant myalgias.  Given the high LDL cholesterol, begs the question is whether she may have a familial hyperlipidemia however given her minimal coronary disease and her age I think that is unlikely.  PMHx:  Past Medical History:  Diagnosis Date  . High cholesterol   . Mood disorder [facility])     Past Surgical History:  Procedure Laterality Date  . COLECTOMY    . EUS N/A 11/29/2015   Procedure: LOWER ENDOSCOPIC ULTRASOUND (EUS);  Surgeon: Arta Silence, MD;  Location: [facility] ENDOSCOPY;  Service: Endoscopy;  Laterality: N/A;  . FLEXIBLE SIGMOIDOSCOPY N/A 01/27/2018   Procedure: FLEXIBLE  SIGMOIDOSCOPY;  Surgeon: Otis Brace, MD;  Location: [place];  Service: Gastroenterology;  Laterality: N/A;  . LEFT HEART CATH AND CORONARY ANGIOGRAPHY N/A 11/15/2018   Procedure: LEFT HEART CATH AND CORONARY ANGIOGRAPHY;  Surgeon: Troy Sine, MD;  Location: [facility] CV LAB;  Service: Cardiovascular;  Laterality: N/A;  . POLYPECTOMY  02/19/2016    FAMHx:  Family History  Problem Relation Age of Onset  . Heart attack Mother   . Cancer Father   . Cancer Sister 59  . Other Brother 10       meningitis     SOCHx:   reports that she has never smoked. She has never used smokeless tobacco. She reports current alcohol use of about 2.0 standard drinks of alcohol per week. She reports that she does not use drugs.  ALLERGIES:  Allergies  Allergen Reactions  . Chocolate     Stated by patient from blood work    ROS: Pertinent items noted in HPI and remainder of comprehensive ROS otherwise negative.  HOME MEDS: Current Outpatient Medications on File Prior to Visit  Medication Sig Dispense Refill  . acetaminophen (TYLENOL) 500 MG tablet Take 500 mg by mouth every 6 (six) hours as needed (for pain.).    Marland Kitchen Ascorbic Acid (VITAMIN C) 1000 MG tablet Take 1,000 mg by mouth every evening.    . busPIRone (BUSPAR) 10 MG tablet Take 10 mg by mouth daily.     . Cholecalciferol (VITAMIN D3) 75 MCG (3000 UT) TABS Take 3,000 Units by mouth daily.    . Coenzyme Q10 (CO Q10) 100 MG CAPS Take 100 mg by mouth every evening.     . feeding supplement, ENSURE ENLIVE, (ENSURE ENLIVE) LIQD Take 237 mLs by mouth 2 (two) times daily between meals for 7 days. 3318 mL 0  . FLUoxetine (PROZAC) 10 MG capsule Take 10 mg by mouth daily.    . magnesium oxide (MAG-OX) 400 (241.3 Mg) MG tablet Take 1 tablet (400 mg total) by mouth daily. 3 tablet 0  . Methylfol-Algae-B12-Acetylcyst (CEREFOLIN NAC) 6-90.314-2-600 MG TABS Take 1 tablet by mouth 2 (two) times daily.    . Multiple Vitamin (MULTIVITAMIN WITH  MINERALS) TABS tablet Take 1 tablet by mouth every evening. 90 tablet 0  . niacin 500 MG tablet Take 500 mg by mouth 3 (three) times daily with meals.    . Omega-3 Fatty Acids (FISH OIL) 1000 MG CAPS Take 1,000 mg by mouth at bedtime.    . ondansetron (ZOFRAN ODT) 4 MG disintegrating tablet Take 1 tablet (4 mg total) by mouth every 8 (eight) hours as needed for nausea or vomiting. 20 tablet 0  . pantoprazole (PROTONIX)  40 MG tablet Take 1 tablet (40 mg total) by mouth 2 (two) times daily. 120 tablet 0  . polyethylene glycol (MIRALAX) 17 g packet Take 17 g by mouth daily as needed. 14 each 0  . TURMERIC PO Take 448 mg by mouth every evening.    . vitamin B-12 (CYANOCOBALAMIN) 1000 MCG tablet Take 1,000 mcg by mouth daily.    Marland Kitchen ZETIA 10 MG tablet Take 1 tablet (10 mg total) by mouth every evening. 30 tablet 0   No current facility-administered medications on file prior to visit.    LABS/IMAGING: No results found for this or any previous visit (from the past 48 hour(s)). No results found.  WEIGHTS: Wt Readings from Last 3 Encounters:  06/12/20 143 lb (64.9 kg)  06/08/20 159 lb 2.8 oz (72.2 kg)  05/07/20 150 lb (68 kg)    VITALS: Ht 5\' 4"  (1.626 m)   Wt 143 lb (64.9 kg)   BMI 24.55 kg/m   EXAM: General appearance: alert and no distress Neck: no carotid bruit, no JVD and thyroid not enlarged, symmetric, no tenderness/mass/nodules Lungs: clear to auscultation bilaterally Heart: regular rate and rhythm Abdomen: soft, non-tender; bowel sounds normal; no masses,  no organomegaly Extremities: extremities normal, atraumatic, no cyanosis or edema Pulses: 2+ and symmetric Skin: Skin color, texture, turgor normal. No rashes or lesions Neurologic: Grossly normal Psych: Pleasant  EKG: Deferred  ASSESSMENT: 1. Recent admission for possible sepsis with elevated troponin and normal echo findings (05/2020) 2. Unstable angina -mild nonobstructive coronary disease with a large left system and  diminutive right coronary artery by cath (11/2018) 3. Fatigue, DOE, hypotension -abnormal cardiac CT with mild to moderate coronary disease and possibly occluded right coronary artery with distal collaterals (08/2018) 4. 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 Aortic sclerosis 6. Possible familial hyperlipidemia 7. Statin intolerance-myalgia  PLAN: 1.   Mrs. Woolford was recently hospitalized with the significant hypotension, headache, lactic acidosis and signs of possible sepsis.  There was an elevated troponin up to 1500 however echo was normal without wall motion abnormalities and normal LV function.  It was felt that this was not likely due to an acute coronary syndrome.  Especially given the fact that she had some mild nonobstructive coronary disease by cath in 2019.  The etiology of her recent episode is unclear.  Cultures are all negative.  CSF was negative.  Could have been a viral sepsis.  Nonetheless she has for the most part resolved.  We will plan to check a fasting lipid profile to reestablish her baseline.  She may be a candidate for PCSK9 inhibitor since she is intolerant to statins with recent myalgia.  Pixie Casino, MD, [facility], [place] Director of the Advanced Lipid Disorders &  Cardiovascular Risk Reduction Clinic Diplomate of the American Board of Clinical Lipidology Attending Cardiologist  Direct Dial: [phone]  Fax: [phone]  Website:  [email or web address] Espiridion Supinski 06/12/2020, 9:35 AM

## 2020-06-12 NOTE — Patient Instructions (Signed)
Medication Instructions:  Your physician recommends that you continue on your current medications as directed. Please refer to the Current Medication list given to you today.  *If you need a refill on your cardiac medications before your next appointment, please call your pharmacy*   Lab Work: FASTING lipid panel to check cholesterol   If you have labs (blood work) drawn today and your tests are completely normal, you will receive your results only by: Marland Kitchen MyChart Message (if you have MyChart) OR . A paper copy in the mail If you have any lab test that is abnormal or we need to change your treatment, we will call you to review the results.   Testing/Procedures: NONE   Follow-Up: At [facility], you and your health needs are our priority.  As part of our continuing mission to provide you with exceptional heart care, we have created designated Provider Care Teams.  These Care Teams include your primary Cardiologist (physician) and Advanced Practice Providers (APPs -  Physician Assistants and Nurse Practitioners) who all work together to provide you with the care you need, when you need it.  We recommend signing up for the patient portal called "MyChart".  Sign up information is provided on this After Visit Summary.  MyChart is used to connect with patients for Virtual Visits (Telemedicine).  Patients are able to view lab/test results, encounter notes, upcoming appointments, etc.  Non-urgent messages can be sent to your provider as well.   To learn more about what you can do with MyChart, go to [email or web address].    Your next appointment:   6 month(s)  The format for your next appointment:   In Person  Provider:   You may see Pixie Casino, MD or one of the following Advanced Practice Providers on your designated Care Team:    Almyra Deforest, PA-C  Fabian Sharp, PA-C or   Roby Lofts, [place]    Other Instructions

## 2020-06-13 DIAGNOSIS — E7849 Other hyperlipidemia: Secondary | ICD-10-CM | POA: Diagnosis not present

## 2020-06-13 DIAGNOSIS — D649 Anemia, unspecified: Secondary | ICD-10-CM | POA: Diagnosis not present

## 2020-06-13 DIAGNOSIS — I700 Atherosclerosis of aorta: Secondary | ICD-10-CM | POA: Diagnosis not present

## 2020-06-13 DIAGNOSIS — R6521 Severe sepsis with septic shock: Secondary | ICD-10-CM | POA: Diagnosis not present

## 2020-06-13 DIAGNOSIS — A419 Sepsis, unspecified organism: Secondary | ICD-10-CM | POA: Diagnosis not present

## 2020-06-13 DIAGNOSIS — R1013 Epigastric pain: Secondary | ICD-10-CM | POA: Diagnosis not present

## 2020-06-13 DIAGNOSIS — M810 Age-related osteoporosis without current pathological fracture: Secondary | ICD-10-CM | POA: Diagnosis not present

## 2020-06-14 DIAGNOSIS — E538 Deficiency of other specified B group vitamins: Secondary | ICD-10-CM | POA: Diagnosis not present

## 2020-06-14 DIAGNOSIS — K5900 Constipation, unspecified: Secondary | ICD-10-CM | POA: Diagnosis not present

## 2020-06-14 DIAGNOSIS — E7849 Other hyperlipidemia: Secondary | ICD-10-CM | POA: Diagnosis not present

## 2020-06-14 DIAGNOSIS — D649 Anemia, unspecified: Secondary | ICD-10-CM | POA: Diagnosis not present

## 2020-06-14 DIAGNOSIS — D7589 Other specified diseases of blood and blood-forming organs: Secondary | ICD-10-CM | POA: Diagnosis not present

## 2020-06-27 DIAGNOSIS — Z86010 Personal history of colonic polyps: Secondary | ICD-10-CM | POA: Diagnosis not present

## 2020-06-27 DIAGNOSIS — K449 Diaphragmatic hernia without obstruction or gangrene: Secondary | ICD-10-CM | POA: Diagnosis not present

## 2020-06-27 DIAGNOSIS — R1013 Epigastric pain: Secondary | ICD-10-CM | POA: Diagnosis not present

## 2020-06-28 IMAGING — CR DG ABDOMEN 1V
1 series · 1 of 1 positions shown · non-contrast
Comparison: None.

CLINICAL DATA: Onset abdominal pain and cramping at 6 a.m. this
morning.

EXAM:
ABDOMEN - 1 VIEW

[abdomen kub]
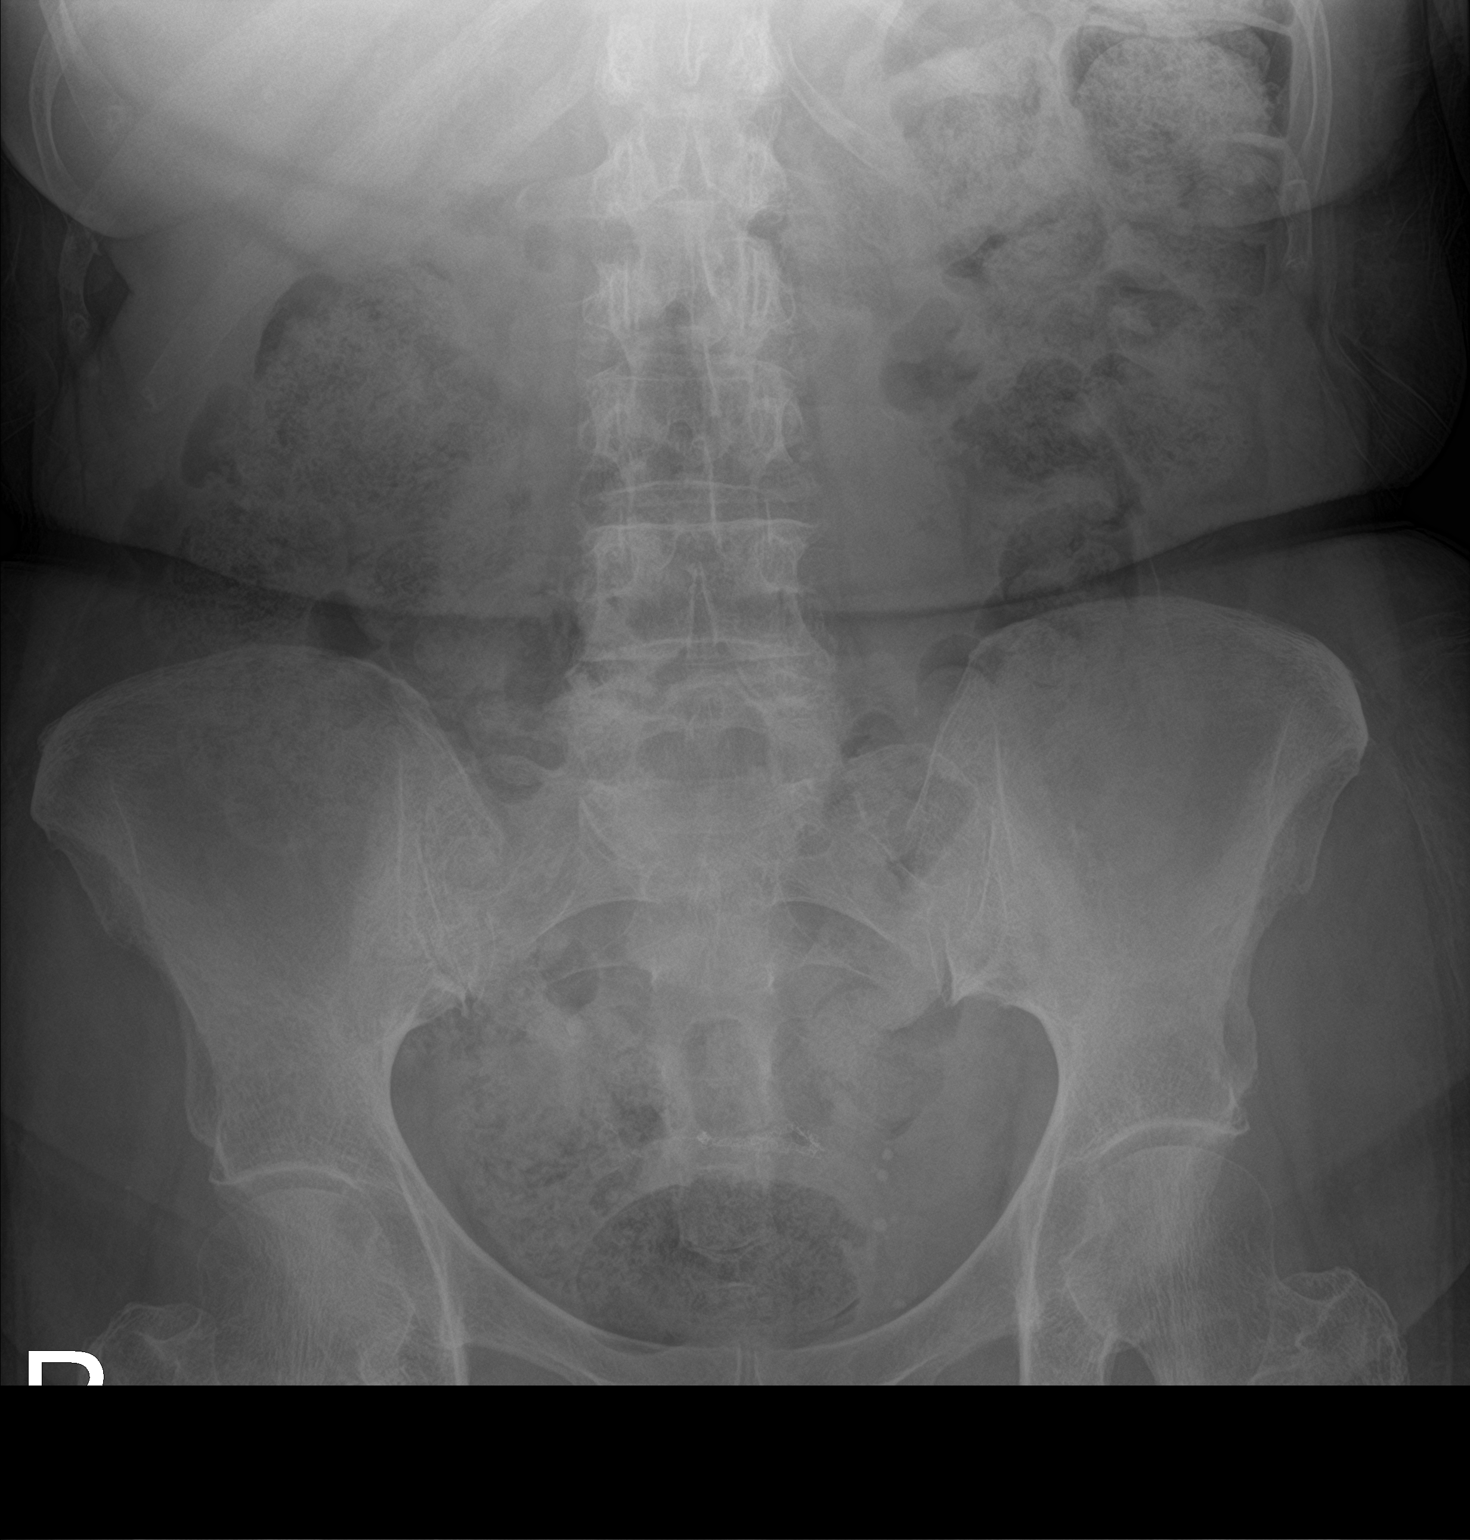

[1 of 1 positions shown; findings below may reference images not displayed]

FINDINGS: The bowel gas pattern is normal. Large stool burden throughout the
colon is seen. No radio-opaque calculi or other significant
radiographic abnormality are seen. Suture material in the central
pelvis incidentally noted.
IMPRESSION: No acute finding.

Large colonic stool burden.

## 2020-07-02 ENCOUNTER — Telehealth: Payer: Self-pay

## 2020-07-02 NOTE — Telephone Encounter (Signed)
   [facility] Pre-operative Risk Assessment    HEARTCARE STAFF: - Please ensure there is not already an duplicate clearance open for this procedure. - Under Visit Info/Reason for Call, type in Other and utilize the format Clearance MM/DD/YY or Clearance TBD. Do not use dashes or single digits. - If request is for dental extraction, please clarify the # of teeth to be extracted.  Request for surgical clearance:  1. What type of surgery is being performed? Endoscopy   2. When is this surgery scheduled? TBD   3. What type of clearance is required (medical clearance vs. Pharmacy clearance to hold med vs. Both)? Medical  4. Are there any medications that need to be held prior to surgery and how long?    5. Practice name and name of physician performing surgery? [facility], Dr. Alessandra Bevels   6. What is the office phone number? [phone]   7.   What is the office fax number?  [phone] or [phone]  8.   Anesthesia type (None, local, MAC, general) ? Not listed   Jacqulynn Cadet 07/02/2020, 8:29 AM  _________________________________________________________________   (provider comments below)

## 2020-07-03 NOTE — Telephone Encounter (Signed)
Dr. Alain Honey - you recently saw Alexandria Wallace, non obst CAD with cath in 2019 and in recent month sepsis -  She needs endo and from your note in July did not seem we needed to do more studies so I was going to clear her.  Any other recommendations?  Thanks

## 2020-07-05 ENCOUNTER — Telehealth: Payer: Self-pay | Admitting: Internal Medicine

## 2020-07-05 NOTE — Telephone Encounter (Signed)
Patient is inquiring about whether or not Dr. Debara Pickett has reviewed lab results sent over from Dr. Baldomero Lamy office. Please return call to discuss.

## 2020-07-05 NOTE — Telephone Encounter (Signed)
Called and spoke with pt, notified that I would send this message to Dr.Hilty's nurse to keep an eye out for the labs to be sent over. Pt asking about her clearance. Notified that the clearance is in but Dr.Hilty needed to review it first. Notified that he is currently on vacation this week but should be back next week. Pt verbalized understanding. Will route to primary RN

## 2020-07-08 NOTE — Telephone Encounter (Signed)
I reviewed her labs from [facility] - LDL back up to 162 ( was just above 70, but she stopped her statins). She called to see if I had reviewed her labs. Based on this, we need to pursue alternate therapy - I believe we discussed PCSK9i?  Dr Lemmie Evens

## 2020-07-08 NOTE — Telephone Encounter (Signed)
Ok to proceed with endoscopy, no further testing. Sent to pre-op pool.  Dr Lemmie Evens

## 2020-07-08 NOTE — Telephone Encounter (Signed)
   Primary Cardiologist: Pixie Casino, MD  Chart reviewed as part of pre-operative protocol coverage. Given past medical history and time since last visit, based on ACC/AHA guidelines, ADELEE HANNULA would be at acceptable risk for the planned procedure without further cardiovascular testing.   I will route this recommendation to the requesting party via Epic fax function and remove from pre-op pool.  Please call with questions.  Jossie Ng. Shantella Blubaugh NP-C    07/08/2020, 12:39 PM [address] [address] Office [phone] Fax [phone]

## 2020-07-09 NOTE — Telephone Encounter (Signed)
Spoke with patient about results - she would like to research PCSK9i. She states she may also be trying an alternative statin(?). Asked that she send me a message with the name of the medication. Sent her websites for [employer] and [employer].

## 2020-07-09 NOTE — Telephone Encounter (Signed)
Thanks

## 2020-07-15 DIAGNOSIS — R7309 Other abnormal glucose: Secondary | ICD-10-CM | POA: Diagnosis not present

## 2020-07-15 DIAGNOSIS — R1013 Epigastric pain: Secondary | ICD-10-CM | POA: Diagnosis not present

## 2020-07-15 DIAGNOSIS — E7849 Other hyperlipidemia: Secondary | ICD-10-CM | POA: Diagnosis not present

## 2020-07-15 DIAGNOSIS — R634 Abnormal weight loss: Secondary | ICD-10-CM | POA: Diagnosis not present

## 2020-07-15 DIAGNOSIS — R571 Hypovolemic shock: Secondary | ICD-10-CM | POA: Diagnosis not present

## 2020-07-15 DIAGNOSIS — E559 Vitamin D deficiency, unspecified: Secondary | ICD-10-CM | POA: Diagnosis not present

## 2020-07-15 DIAGNOSIS — Z79899 Other long term (current) drug therapy: Secondary | ICD-10-CM | POA: Diagnosis not present

## 2020-07-15 DIAGNOSIS — R700 Elevated erythrocyte sedimentation rate: Secondary | ICD-10-CM | POA: Diagnosis not present

## 2020-07-15 DIAGNOSIS — D509 Iron deficiency anemia, unspecified: Secondary | ICD-10-CM | POA: Diagnosis not present

## 2020-07-22 ENCOUNTER — Other Ambulatory Visit: Payer: Self-pay

## 2020-07-22 ENCOUNTER — Ambulatory Visit: Payer: Medicare PPO | Attending: Family Medicine

## 2020-07-22 DIAGNOSIS — M6281 Muscle weakness (generalized): Secondary | ICD-10-CM | POA: Diagnosis not present

## 2020-07-22 DIAGNOSIS — R293 Abnormal posture: Secondary | ICD-10-CM | POA: Diagnosis not present

## 2020-07-22 DIAGNOSIS — M62838 Other muscle spasm: Secondary | ICD-10-CM | POA: Diagnosis not present

## 2020-07-22 NOTE — Therapy (Signed)
[facility] [address], [place], [place], [address] Phone: [phone]   Fax:  [phone]  Physical Therapy Evaluation  Patient Details  Name: Alexandria Wallace MRN: [id number] Date of Birth: [birth year] Referring Provider (PT): Cari Caraway, MD   Encounter Date: 07/22/2020   PT End of Session - 07/22/20 1217    Visit Number 1    Date for PT Re-Evaluation 09/16/20    Authorization Type [employer]- approval requested    PT Start Time 1147    PT Stop Time 1216    PT Time Calculation (min) 29 min    Activity Tolerance Patient tolerated treatment well    Behavior During Therapy [facility] for tasks assessed/performed           Past Medical History:  Diagnosis Date  . High cholesterol   . Mood disorder [facility])     Past Surgical History:  Procedure Laterality Date  . COLECTOMY    . EUS N/A 11/29/2015   Procedure: LOWER ENDOSCOPIC ULTRASOUND (EUS);  Surgeon: Arta Silence, MD;  Location: [facility] ENDOSCOPY;  Service: Endoscopy;  Laterality: N/A;  . FLEXIBLE SIGMOIDOSCOPY N/A 01/27/2018   Procedure: FLEXIBLE SIGMOIDOSCOPY;  Surgeon: Otis Brace, MD;  Location: [place];  Service: Gastroenterology;  Laterality: N/A;  . LEFT HEART CATH AND CORONARY ANGIOGRAPHY N/A 11/15/2018   Procedure: LEFT HEART CATH AND CORONARY ANGIOGRAPHY;  Surgeon: Troy Sine, MD;  Location: [place] CV LAB;  Service: Cardiovascular;  Laterality: N/A;  . POLYPECTOMY  02/19/2016    There were no vitals filed for this visit.    Subjective Assessment - 07/22/20 1138    Subjective Pt presents to PT with new diagnosis of osteoprosis.  I tried medications and then had to stop due to another medical issue.  I have a homeopathic MD and he is addressing my dietary needs related to osteoporosis.    Pertinent History T-score -2.6    How long can you walk comfortably? I walk 1 mile for exercise daily    Diagnostic tests bone density scan: T score  -2.6    Patient Stated Goals learn exercises that will help osteoporosis    Currently in Pain? No/denies              [facility] PT Assessment - 07/22/20 0001      Assessment   Medical Diagnosis osteoporosis without current patholigical fracture    Referring Provider (PT) Cari Caraway, MD    Onset Date/Surgical Date 04/21/20    Next MD Visit 6 months    Prior Therapy none      Precautions   Precautions Other (comment)    Precaution Comments osteoporosis      Restrictions   Weight Bearing Restrictions No      Balance Screen   Has the patient fallen in the past 6 months No    Has the patient had a decrease in activity level because of a fear of falling?  No    Is the patient reluctant to leave their home because of a fear of falling?  No      Home Environment   Living Environment Private residence    [place] to enter    Home Layout Two level      Prior Function   Level of Independence Independent    Vocation Retired    Leisure walk for exercise, games with friends      Cognition   Overall Cognitive  Status Within Functional Limits for tasks assessed      Observation/Other Assessments   Focus on Therapeutic Outcomes (FOTO)  --   NA- no pain     Posture/Postural Control   Posture/Postural Control Postural limitations    Postural Limitations Rounded Shoulders;Forward head;Flexed trunk      ROM / Strength   AROM / PROM / Strength AROM;PROM;Strength      AROM   Overall AROM  Within functional limits for tasks performed    Overall AROM Comments Lt shoulder abduction limited by 50%- chronic condition      PROM   Overall PROM  Within functional limits for tasks performed      Strength   Overall Strength Within functional limits for tasks performed    Overall Strength Comments UE/LE strength is WFLs       Palpation   Palpation comment No palpable tenderness      Transfers   Transfers Sit to Stand;Stand to Sit    Sit to  Stand 7: Independent      Ambulation/Gait   Ambulation/Gait Yes    Gait Pattern Step-through pattern;Decreased step length - left;Decreased step length - right                      Objective measurements completed on examination: See above findings.               PT Education - 07/22/20 1212    Education Details Access Code: [email or web address], avoid flexion and rotation at the spine    Person(s) Educated Patient    Methods Explanation;Demonstration    Comprehension Verbalized understanding;Returned demonstration            PT Short Term Goals - 07/22/20 1227      PT SHORT TERM GOAL #1   Title be independent in initial HEP    Time 4    Period Weeks    Status New    Target Date 08/19/20             PT Long Term Goals - 07/22/20 1227      PT LONG TERM GOAL #1   Title Patient will verbally understand correct body mechanics for home tasks to decrease strain on spine.    Baseline --    Time 8    Period Weeks    Status New    Target Date 09/16/20      PT LONG TERM GOAL #2   Title Patient will verbally understand ways to strengthen postural musculature.    Baseline --    Time 8    Period Weeks    Status New    Target Date 09/16/20      PT LONG TERM GOAL #3   Title Patient can verbally understand the dos and don'ts of osteoporosis management.    Baseline --    Time 8    Period Weeks    Status New    Target Date 09/16/20                  Plan - 07/22/20 1224    Clinical Impression Statement Pt arrives with diagnosis of osteoporosis with t-score -2 to -2.6 diagnosed in the past few months.  Pt is regular with walking for exercise and is participating in self-guided balance exercises.  Pt demonstrates forward head, rounded shoulder and flexed trunk posture.  Pt is able to correct to neutral and has difficulty maintaining this for >1 minute.  Pt will benefit  from 3-6 PT sessions for instruction in HEP for postural strength, body mechanics  education to prevent fracture and weight bearing/core exercise and weight training to prevent bone loss.    Personal Factors and Comorbidities Comorbidity 2    Comorbidities osteoporosis, sepsis    Examination-Participation Restrictions Laundry;Meal Prep    Stability/Clinical Decision Making Stable/Uncomplicated    Clinical Decision Making Low    Rehab Potential Excellent    PT Frequency 1x / week    PT Duration 8 weeks   3-6 sessions probable   PT Treatment/Interventions ADLs/Self Care Home Management;Cryotherapy;Electrical Stimulation;Neuromuscular re-education;Manual techniques;Therapeutic exercise;Balance training;Functional mobility training;Therapeutic activities;Patient/family education    PT Next Visit Plan body mechanics education related to osteoporosis, exercise with hand weights (she has 3# at home), weightbearing hip and core exercise, review HEP issued today    PT Home Exercise Plan Access Code: [facility]    Consulted and Agree with Plan of Care Patient           Patient will benefit from skilled therapeutic intervention in order to improve the following deficits and impairments:  Postural dysfunction, Improper body mechanics, Impaired flexibility, Decreased safety awareness  Visit Diagnosis: Muscle weakness (generalized) - Plan: PT plan of care cert/re-cert  Abnormal posture - Plan: PT plan of care cert/re-cert  Other muscle spasm     Problem List Patient Active Problem List   Diagnosis Date Noted  . Hypotension   . Elevated troponin   . Nausea   . Nausea and vomiting   . Septic shock ([facility]) 06/05/2020  . AKI (acute kidney injury) ([place])   . Metabolic acidosis, increased anion gap   . Dyspnea   . 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 Murmur 11/11/2016  . 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 [place],  [place], [address] Phone: [phone]   Fax:  [phone]  Name: Alexandria Wallace MRN: [id number] Date of Birth: 10-24-1941

## 2020-07-22 NOTE — Patient Instructions (Signed)
Access Code: [facility] URL: [email or web address] Date: 07/22/2020 Prepared by: Claiborne Billings  Exercises Seated Correct Posture - 1 x daily - 7 x weekly - 3 sets - 10 reps Seated Shoulder Horizontal Abduction with Resistance - 2 x daily - 7 x weekly - 1-2 sets - 10 reps Standing Shoulder External Rotation with Resistance - 2 x daily - 7 x weekly - 1-2 sets - 10 reps Seated Shoulder Diagonal with Resistance - 2 x daily - 7 x weekly - 1-2 sets - 10 reps Sit to Stand without Arm Support - 2 x daily - 7 x weekly - 2 sets - 10 reps Seated Scapular Retraction - 1 x daily - 7 x weekly - 3 sets - 10 reps

## 2020-07-27 IMAGING — CT CT HEAD W/O CM
3 series · 15 of 47 positions shown, 18 images · non-contrast
Comparison: None.

CLINICAL DATA: Headache and nausea.

EXAM:
CT HEAD WITHOUT CONTRAST
TECHNIQUE: Contiguous axial images were obtained from the base of the skull
through the vertex without intravenous contrast.

[Series 3: head 5.0 h30s · axial · 0.43mm/px · z∈[-148,-13]mm · 9 of 33 slices shown, 12 images]
[im 3/33  brain]
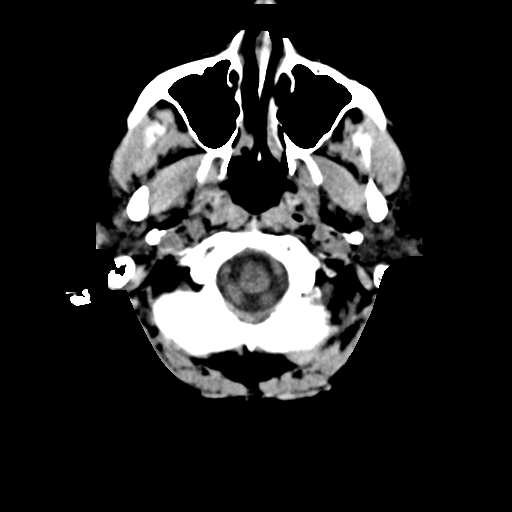
[im 3/33  bone]
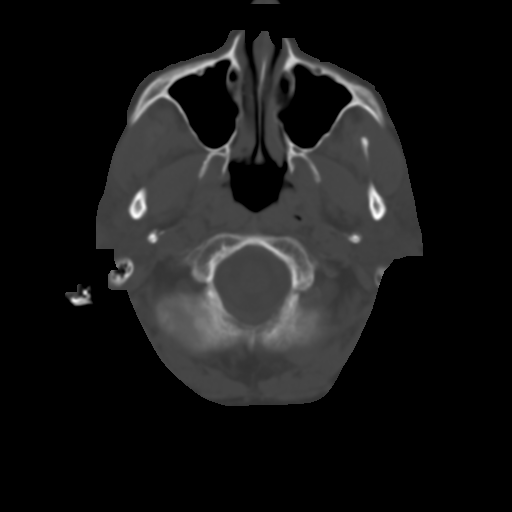
[im 6/33  brain]
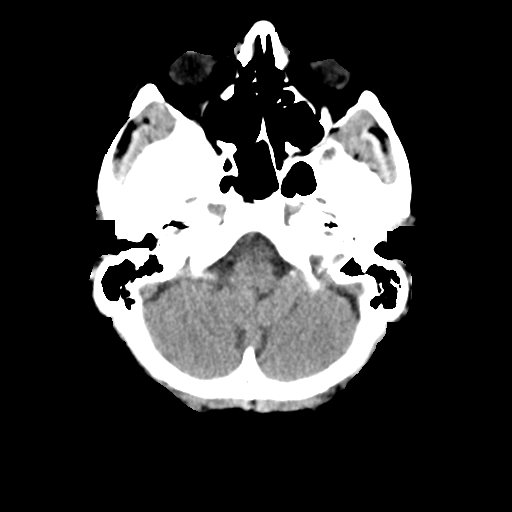
[im 9/33  brain]
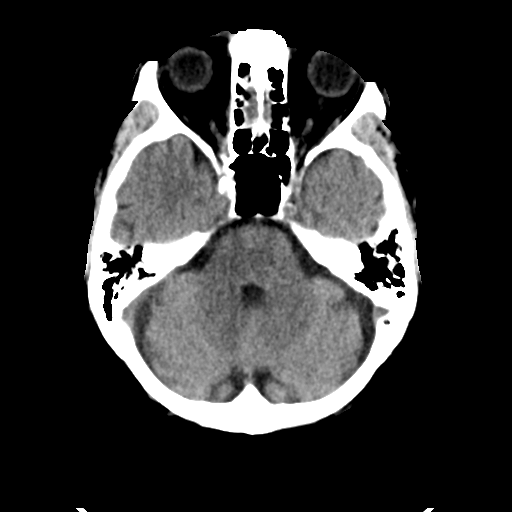
[im 13/33  brain]
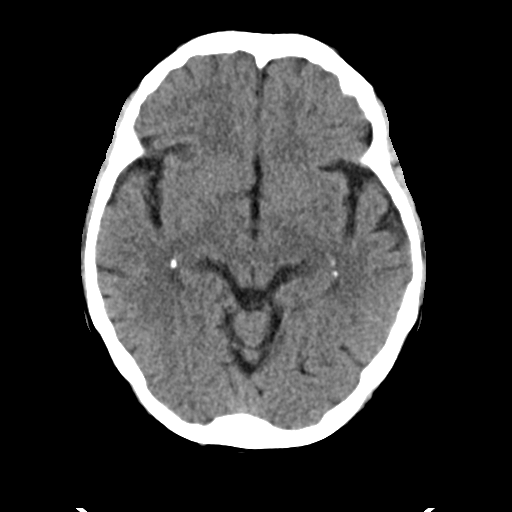
[im 17/33  brain]
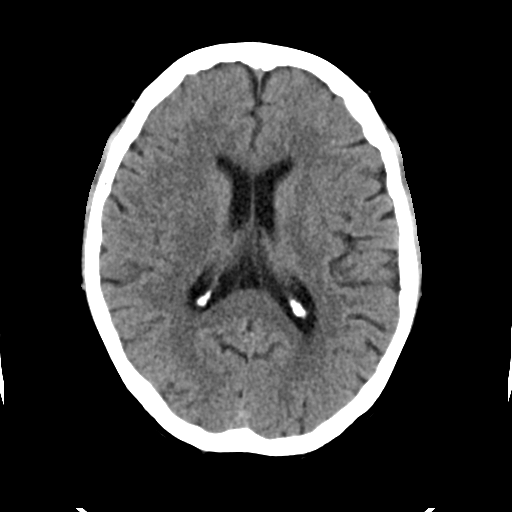
[im 17/33  bone]
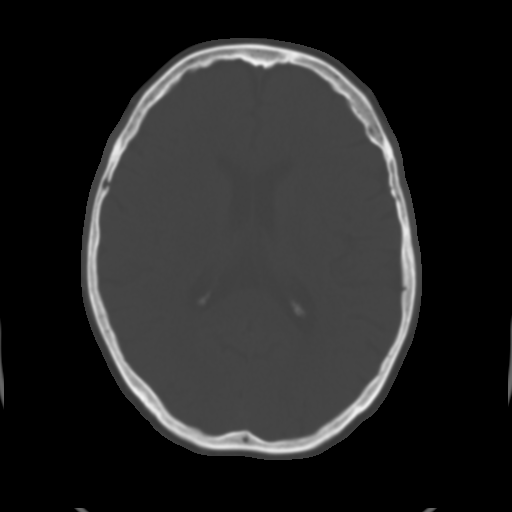
[im 20/33  brain]
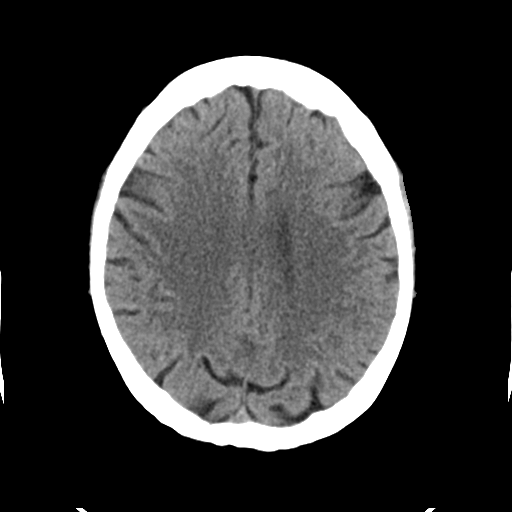
[im 24/33  brain]
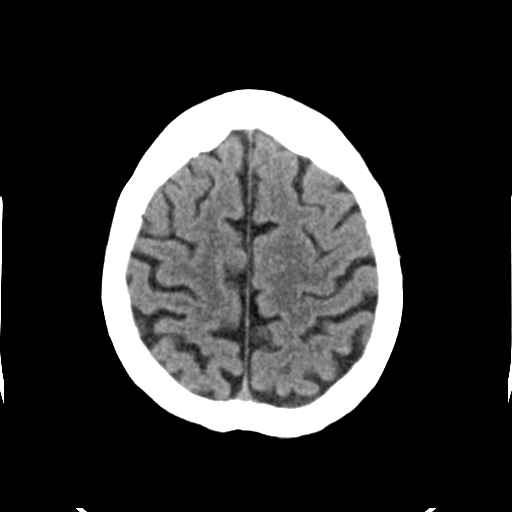
[im 27/33  brain]
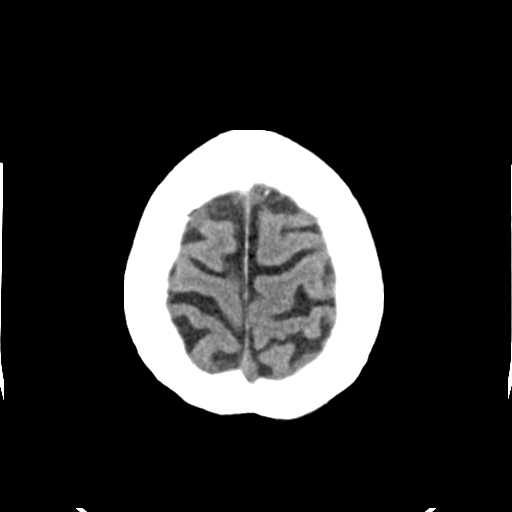
[im 30/33  brain]
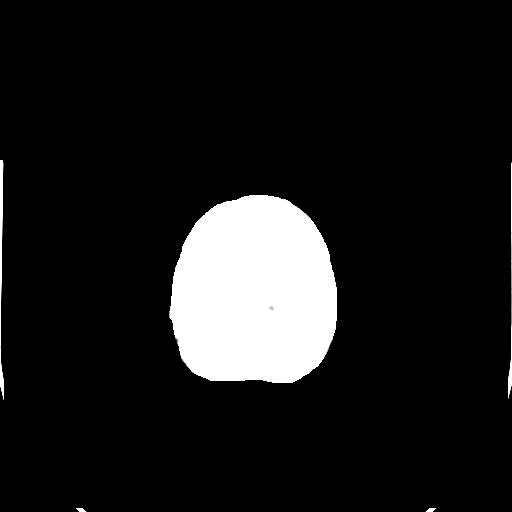
[im 30/33  bone]
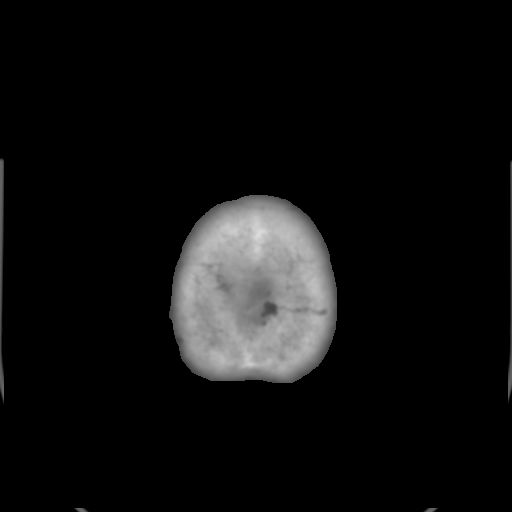

[Series 5: head 3.0 mpr cor · coronal · 0.31mm/px · 3 of 65 slices shown]
[im 22/65  brain]
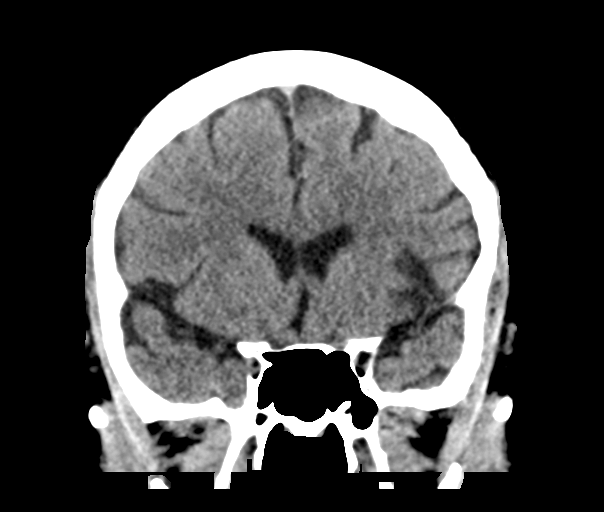
[im 29/65  brain]
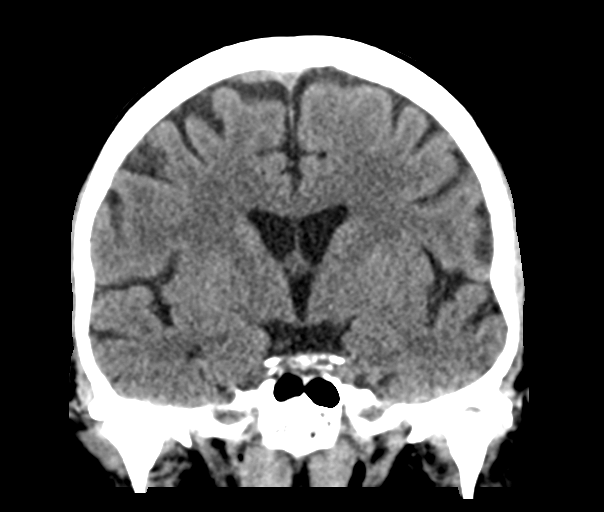
[im 36/65  brain]
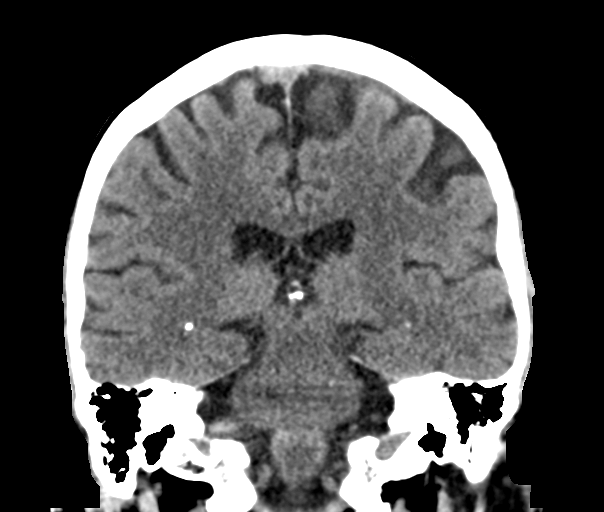

[Series 6: head 3.0 mpr sag · sagittal · 0.31mm/px · 3 of 57 slices shown]
[im 19/57  brain]
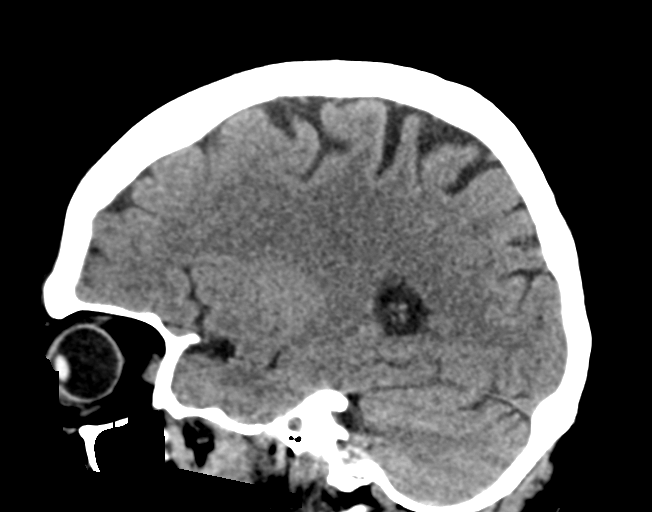
[im 29/57  brain]
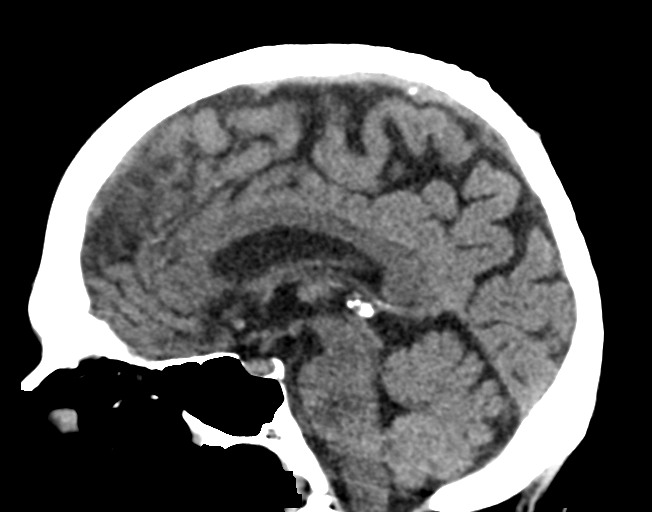
[im 38/57  brain]
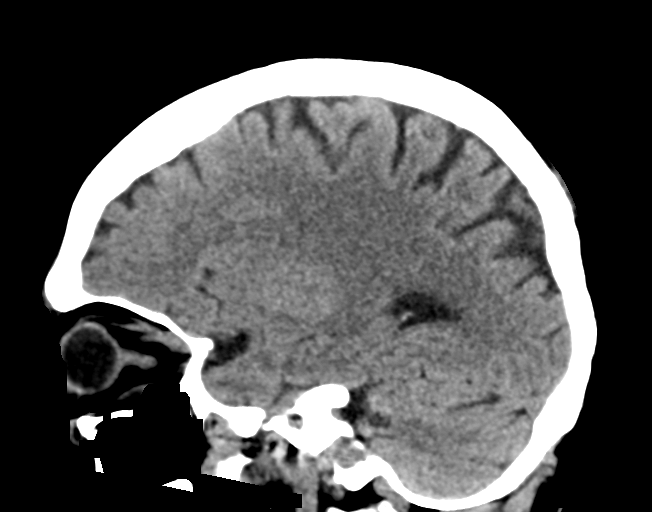

[15 of 47 positions shown; findings below may reference images not displayed]

FINDINGS: Brain: The ventricles are normal in size and configuration for age.
No extra-axial fluid collections are identified. The gray-white
differentiation is maintained. No CT findings for acute hemispheric
infarction or intracranial hemorrhage. No mass lesions. The
brainstem and cerebellum are normal.

Vascular: Scattered vascular calcifications. No hyperdense vessels
or obvious aneurysm.

Skull: No acute skull fracture.  No bone lesion.

Sinuses/Orbits: The paranasal sinuses and mastoid air cells are
clear. The globes are intact.

Other: No scalp lesions, laceration or hematoma.
IMPRESSION: No acute intracranial findings or mass lesions.

## 2020-07-28 IMAGING — DX DG CHEST 1V PORT
1 series · 1 of 1 positions shown · non-contrast
Comparison: Chest x-ray 06/05/2020.

CLINICAL DATA: Respiratory failure.

EXAM:
PORTABLE CHEST 1 VIEW

[chest]
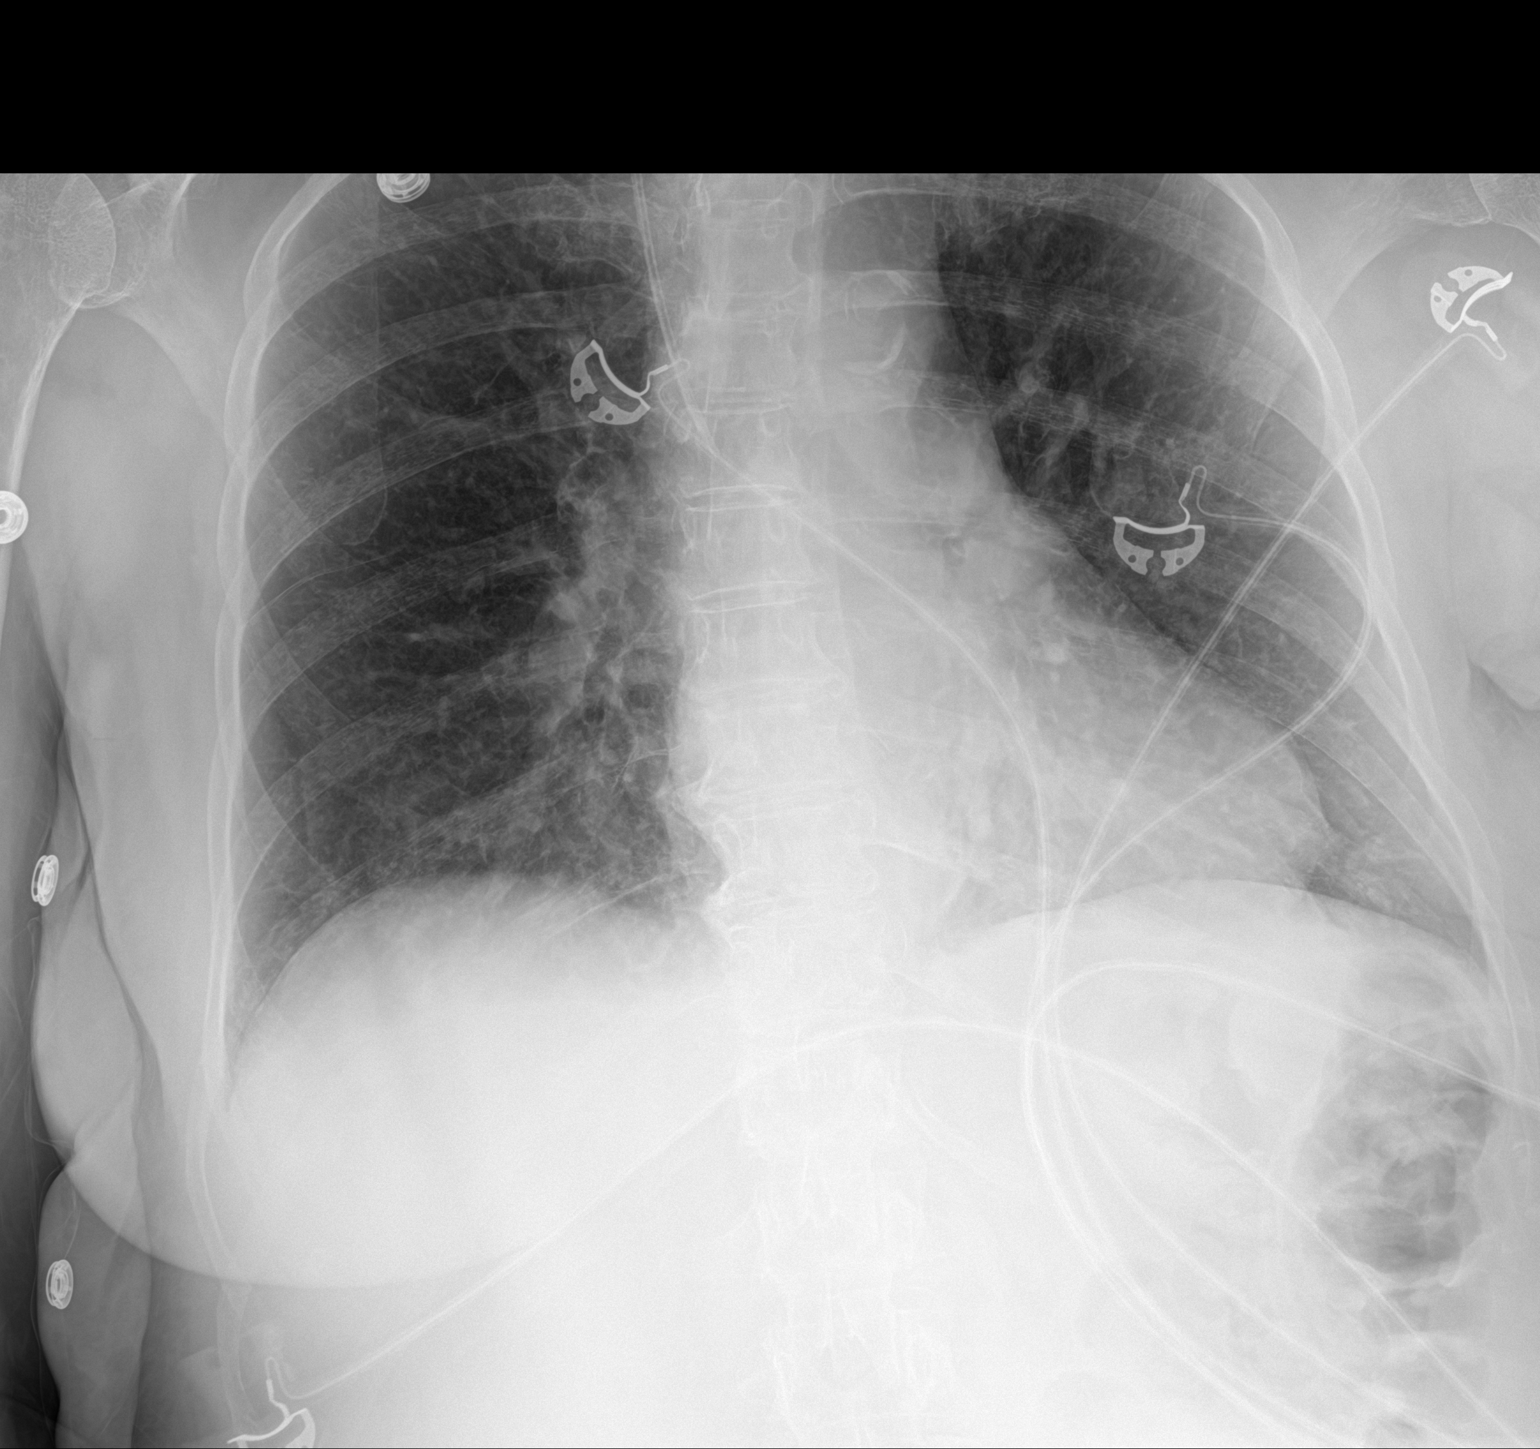

[1 of 1 positions shown; findings below may reference images not displayed]

FINDINGS: Right IJ line stable position. Heart size stable. Mild bibasilar
subsegmental atelectasis/infiltrates. No pleural effusion or
pneumothorax. Degenerative changes scoliosis thoracic spine.
IMPRESSION: 1.  Right IJ line stable position.

2.  Mild bibasilar subsegmental atelectasis/infiltrates.

## 2020-07-29 ENCOUNTER — Ambulatory Visit: Payer: Medicare PPO | Admitting: Physical Therapy

## 2020-07-29 ENCOUNTER — Other Ambulatory Visit: Payer: Self-pay

## 2020-07-29 DIAGNOSIS — M62838 Other muscle spasm: Secondary | ICD-10-CM

## 2020-07-29 DIAGNOSIS — R293 Abnormal posture: Secondary | ICD-10-CM | POA: Diagnosis not present

## 2020-07-29 DIAGNOSIS — M6281 Muscle weakness (generalized): Secondary | ICD-10-CM

## 2020-07-29 NOTE — Therapy (Signed)
[facility] [address] [place], [place], [address] Phone: [phone]   Fax:  [phone]  Physical Therapy Treatment  Patient Details  Name: Alexandria Wallace MRN: [id number] Date of Birth: 13-Nov-1941 Referring Provider (PT): Cari Caraway, MD   Encounter Date: 07/29/2020   PT End of Session - 07/29/20 1145    Visit Number 2    Date for PT Re-Evaluation 09/16/20    Authorization Type [employer]- approval requested    PT Start Time 0086    PT Stop Time 1220    PT Time Calculation (min) 36 min    Activity Tolerance Patient tolerated treatment well    Behavior During Therapy [facility] for tasks assessed/performed           Past Medical History:  Diagnosis Date  . High cholesterol   . Mood disorder [facility])     Past Surgical History:  Procedure Laterality Date  . COLECTOMY    . EUS N/A 11/29/2015   Procedure: LOWER ENDOSCOPIC ULTRASOUND (EUS);  Surgeon: Arta Silence, MD;  Location: [facility] ENDOSCOPY;  Service: Endoscopy;  Laterality: N/A;  . FLEXIBLE SIGMOIDOSCOPY N/A 01/27/2018   Procedure: FLEXIBLE SIGMOIDOSCOPY;  Surgeon: Otis Brace, MD;  Location: [place];  Service: Gastroenterology;  Laterality: N/A;  . LEFT HEART CATH AND CORONARY ANGIOGRAPHY N/A 11/15/2018   Procedure: LEFT HEART CATH AND CORONARY ANGIOGRAPHY;  Surgeon: Troy Sine, MD;  Location: [place] CV LAB;  Service: Cardiovascular;  Laterality: N/A;  . POLYPECTOMY  02/19/2016    There were no vitals filed for this visit.                      [facility] Adult PT Treatment/Exercise - 07/29/20 0001      Self-Care   Self-Care Posture    Posture Using towel roll in chair, more thoracic extension when sitting/standing (lift theheart) not performing forward bendiing for ADLs like tying shoes keeping back straighter and not in as much flexion. Pt understood princiiples verbally.    Other Self-Care Comments  Looking into a weighted  vest to walk with. pt will research if she is interested in doing.       Shoulder Exercises: Seated   Horizontal ABduction Strengthening;Both;10 reps;Theraband    Theraband Level (Shoulder Horizontal ABduction) Level 2 (Red)    External Rotation Strengthening;Both;10 reps;Theraband    Theraband Level (Shoulder External Rotation) Level 2 (Red)    Diagonals Strengthening;Right;Both;10 reps;Theraband    Theraband Level (Shoulder Diagonals) Level 2 (Red)    Diagonals Limitations Pt will do less reps on tthe left shoulder                   PT Education - 07/29/20 1201    Education Details standing bicep curls 3#: open face curls and hammer curls. adding 3# wts to sit to stand exercise, how we use stress to keep bones strong and/or strengthen them, stress being weightbearing or resistance training.    Person(s) Educated Patient    Methods Explanation;Demonstration;Verbal cues    Comprehension Returned demonstration;Verbalized understanding            PT Short Term Goals - 07/29/20 1206      PT SHORT TERM GOAL #1   Title be independent in initial HEP    Time 4    Period Weeks    Status Achieved    Target Date 08/19/20             PT Long  Term Goals - 07/22/20 1227      PT LONG TERM GOAL #1   Title Patient will verbally understand correct body mechanics for home tasks to decrease strain on spine.    Baseline --    Time 8    Period Weeks    Status New    Target Date 09/16/20      PT LONG TERM GOAL #2   Title Patient will verbally understand ways to strengthen postural musculature.    Baseline --    Time 8    Period Weeks    Status New    Target Date 09/16/20      PT LONG TERM GOAL #3   Title Patient can verbally understand the dos and don'ts of osteoporosis management.    Baseline --    Time 8    Period Weeks    Status New    Target Date 09/16/20                 Plan - 07/29/20 1146    Clinical Impression Statement Pt is independent in initial  HEP, meeting STG. She does have some Lt shoulder pain that she needs to be mindful of when doing her tband diagonal exercise. Today we added bicep curls with 3# wts in 2 different grip positions and sit to stand holding onto the same weights for HEP progression, Pt was educated in how stress keeps bones strong as well as trying to keep her back straighter with some ADLS in order to have positive spine outcomes.    Personal Factors and Comorbidities Comorbidity 2    Comorbidities osteoporosis, sepsis    Stability/Clinical Decision Making Stable/Uncomplicated    PT Frequency 1x / week    PT Duration 8 weeks    PT Treatment/Interventions ADLs/Self Care Home Management;Cryotherapy;Electrical Stimulation;Neuromuscular re-education;Manual techniques;Therapeutic exercise;Balance training;Functional mobility training;Therapeutic activities;Patient/family education    PT Next Visit Plan Pt may need 1-2 other hip strengthening exercises added to HEP. Review bicep curls and follow up with body Dealer principles.    PT Home Exercise Plan Access Code: [facility]    Consulted and Agree with Plan of Care Patient           Patient will benefit from skilled therapeutic intervention in order to improve the following deficits and impairments:  Postural dysfunction, Improper body mechanics, Impaired flexibility, Decreased safety awareness  Visit Diagnosis: Muscle weakness (generalized)  Abnormal posture  Other muscle spasm     Problem List Patient Active Problem List   Diagnosis Date Noted  . Hypotension   . Elevated troponin   . Nausea   . Nausea and vomiting   . Septic shock ([place]) 06/05/2020  . AKI (acute kidney injury) ([place])   . Metabolic acidosis, increased anion gap   . Dyspnea   . 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 Murmur 11/11/2016  . Mixed hyperlipidemia 11/11/2016    Rheda Kassab, PTA 07/29/2020, 12:29 PM  [facility] [address], [place], [place], [address] Phone: [phone]   Fax:  [phone]  Name: Alexandria Wallace MRN: [id number] Date of Birth: 1941-09-27

## 2020-08-06 ENCOUNTER — Ambulatory Visit: Payer: Medicare PPO | Attending: Family Medicine

## 2020-08-06 ENCOUNTER — Other Ambulatory Visit: Payer: Self-pay

## 2020-08-06 DIAGNOSIS — M62838 Other muscle spasm: Secondary | ICD-10-CM | POA: Insufficient documentation

## 2020-08-06 DIAGNOSIS — R293 Abnormal posture: Secondary | ICD-10-CM | POA: Insufficient documentation

## 2020-08-06 DIAGNOSIS — M6281 Muscle weakness (generalized): Secondary | ICD-10-CM | POA: Diagnosis not present

## 2020-08-06 NOTE — Therapy (Addendum)
[facility] [address] [place], [place], [address] Phone: [phone]   Fax:  [phone]  Physical Therapy Treatment  Patient Details  Name: Alexandria Wallace MRN: [id number] Date of Birth: 1941-07-01 Referring Provider (PT): Cari Caraway, MD   Encounter Date: 08/06/2020   PT End of Session - 08/06/20 1053    Visit Number 3    Date for PT Re-Evaluation 09/16/20    Authorization Type [employer]- approval requested    PT Start Time 1017    PT Stop Time 1045    PT Time Calculation (min) 28 min    Activity Tolerance Patient tolerated treatment well    Behavior During Therapy [facility] for tasks assessed/performed           Past Medical History:  Diagnosis Date  . High cholesterol   . Mood disorder [facility])     Past Surgical History:  Procedure Laterality Date  . COLECTOMY    . EUS N/A 11/29/2015   Procedure: LOWER ENDOSCOPIC ULTRASOUND (EUS);  Surgeon: Arta Silence, MD;  Location: [facility] ENDOSCOPY;  Service: Endoscopy;  Laterality: N/A;  . FLEXIBLE SIGMOIDOSCOPY N/A 01/27/2018   Procedure: FLEXIBLE SIGMOIDOSCOPY;  Surgeon: Otis Brace, MD;  Location: [place];  Service: Gastroenterology;  Laterality: N/A;  . LEFT HEART CATH AND CORONARY ANGIOGRAPHY N/A 11/15/2018   Procedure: LEFT HEART CATH AND CORONARY ANGIOGRAPHY;  Surgeon: Troy Sine, MD;  Location: [place] CV LAB;  Service: Cardiovascular;  Laterality: N/A;  . POLYPECTOMY  02/19/2016    There were no vitals filed for this visit.   Subjective Assessment - 08/06/20 1021    Subjective I am doing my exercises at home and have              [facility] PT Assessment - 08/06/20 0001      Assessment   Medical Diagnosis osteoporosis without current patholigical fracture    Referring Provider (PT) Cari Caraway, MD    Onset Date/Surgical Date 04/21/20                         [facility] Adult PT Treatment/Exercise - 08/06/20 0001       Exercises   Exercises Knee/Hip      Knee/Hip Exercises: Standing   Hip Abduction Stengthening;Both;2 sets;10 reps    Hip Extension Stengthening;Both;2 sets;10 reps                  PT Education - 08/06/20 1038    Education Details Access Code: [email or web address], body mechanics education    Person(s) Educated Patient    Methods Explanation;Demonstration;Handout    Comprehension Verbalized understanding;Returned demonstration            PT Short Term Goals - 07/29/20 1206      PT SHORT TERM GOAL #1   Title be independent in initial HEP    Time 4    Period Weeks    Status Achieved    Target Date 08/19/20             PT Long Term Goals - 08/06/20 1035      PT LONG TERM GOAL #1   Title Patient will verbally understand correct body mechanics for home tasks to decrease strain on spine.    Status Achieved      PT LONG TERM GOAL #2   Title Patient will verbally understand ways to strengthen postural musculature.    Status Achieved      PT  LONG TERM GOAL #3   Title Patient can verbally understand the dos and don'ts of osteoporosis management.                 Plan - 08/06/20 1052    Clinical Impression Statement PT provided body mechanics education with discussion of movements to avoid in order prevent risk of fracture.  PT added standing hip strength exercises and provided verbal review of all HEP including the importance of each exercise.  Pt will be placed on hold until 08/29/20 and only return if needed before then.    PT Next Visit Plan hold chart unitl 08/29/20 and D/C if pt doesn't return.    PT Home Exercise Plan Access Code: [facility]    Consulted and Agree with Plan of Care Patient           Patient will benefit from skilled therapeutic intervention in order to improve the following deficits and impairments:     Visit Diagnosis: Muscle weakness (generalized)  Abnormal posture  Other muscle spasm     Problem List Patient Active Problem List    Diagnosis Date Noted  . Hypotension   . Elevated troponin   . Nausea   . Nausea and vomiting   . Septic shock ([place]) 06/05/2020  . AKI (acute kidney injury) ([place])   . Metabolic acidosis, increased anion gap   . Dyspnea   . 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 Murmur 11/11/2016  . Mixed hyperlipidemia 11/11/2016     Sigurd Sos, PT 08/06/20 10:57 AM PHYSICAL THERAPY DISCHARGE SUMMARY  Visits from Start of Care: 3  Current functional level related to goals / functional outcomes: See above for current status.  All goals met.     Remaining deficits: No deficits at this time.     Education / Equipment: HEP, Economist education, osteoporosis education Plan: Patient agrees to discharge.  Patient goals were met. Patient is being discharged due to meeting the stated rehab goals.  ?????        Sigurd Sos, PT 08/29/20 8:30 AM  [facility] [address], [place], [place], [address] Phone: [phone]   Fax:  [phone]  Name: Alexandria Wallace MRN: [id number] Date of Birth: [birth year]

## 2020-08-06 NOTE — Patient Instructions (Addendum)
° °  Lifting Principles  Maintain proper posture and head alignment. Slide object as close as possible before lifting. Move obstacles out of the way. Test before lifting; ask for help if too heavy. Tighten stomach muscles without holding breath. Use smooth movements; do not jerk. Use legs to do the work, and pivot with feet. Distribute the work load symmetrically and close to the center of trunk. Push instead of pull whenever possible.   Squat down and hold basket close to stand. Use leg muscles to do the work.    Avoid twisting or bending back. Pivot around using foot movements, and bend at knees if needed when reaching for articles.        Getting Into / Out of Bed   Lower self to lie down on one side by raising legs and lowering head at the same time. Use arms to assist moving without twisting. Bend both knees to roll onto back if desired. To sit up, start from lying on side, and use same move-ments in reverse. Keep trunk aligned with legs.    Shift weight from front foot to back foot as item is lifted off shelf.    When leaning forward to pick object up from floor, extend one leg out behind. Keep back straight. Hold onto a sturdy support with other hand.      Sit upright, head facing forward. Try using a roll to support lower back. Keep shoulders relaxed, and avoid rounded back. Keep hips level with knees. Avoid crossing legs for long periods.    Access Code: [facility] URL: [email or web address] Date: 08/06/2020 Prepared by: Claiborne Billings  Exercises   Standing Hip Abduction with Counter Support - 2 x daily - 7 x weekly - 2 sets - 10 reps Standing Hip Extension - 2 x daily - 7 x weekly - 2 sets - 10 reps

## 2020-08-15 DIAGNOSIS — Z1159 Encounter for screening for other viral diseases: Secondary | ICD-10-CM | POA: Diagnosis not present

## 2020-08-20 DIAGNOSIS — K2930 Chronic superficial gastritis without bleeding: Secondary | ICD-10-CM | POA: Diagnosis not present

## 2020-08-20 DIAGNOSIS — R1013 Epigastric pain: Secondary | ICD-10-CM | POA: Diagnosis not present

## 2020-08-20 DIAGNOSIS — Z86010 Personal history of colonic polyps: Secondary | ICD-10-CM | POA: Diagnosis not present

## 2020-08-20 DIAGNOSIS — D509 Iron deficiency anemia, unspecified: Secondary | ICD-10-CM | POA: Diagnosis not present

## 2020-08-20 DIAGNOSIS — Z980 Intestinal bypass and anastomosis status: Secondary | ICD-10-CM | POA: Diagnosis not present

## 2020-08-20 DIAGNOSIS — K449 Diaphragmatic hernia without obstruction or gangrene: Secondary | ICD-10-CM | POA: Diagnosis not present

## 2020-08-20 DIAGNOSIS — K3189 Other diseases of stomach and duodenum: Secondary | ICD-10-CM | POA: Diagnosis not present

## 2020-08-20 DIAGNOSIS — K228 Other specified diseases of esophagus: Secondary | ICD-10-CM | POA: Diagnosis not present

## 2020-08-20 DIAGNOSIS — Q438 Other specified congenital malformations of intestine: Secondary | ICD-10-CM | POA: Diagnosis not present

## 2020-08-20 DIAGNOSIS — D120 Benign neoplasm of cecum: Secondary | ICD-10-CM | POA: Diagnosis not present

## 2020-08-26 DIAGNOSIS — K2930 Chronic superficial gastritis without bleeding: Secondary | ICD-10-CM | POA: Diagnosis not present

## 2020-08-26 DIAGNOSIS — D120 Benign neoplasm of cecum: Secondary | ICD-10-CM | POA: Diagnosis not present

## 2020-09-30 DIAGNOSIS — Z20822 Contact with and (suspected) exposure to covid-19: Secondary | ICD-10-CM | POA: Diagnosis not present

## 2020-10-07 DIAGNOSIS — H40013 Open angle with borderline findings, low risk, bilateral: Secondary | ICD-10-CM | POA: Diagnosis not present

## 2020-10-07 DIAGNOSIS — H25043 Posterior subcapsular polar age-related cataract, bilateral: Secondary | ICD-10-CM | POA: Diagnosis not present

## 2020-10-07 DIAGNOSIS — H2513 Age-related nuclear cataract, bilateral: Secondary | ICD-10-CM | POA: Diagnosis not present

## 2020-10-07 DIAGNOSIS — H25013 Cortical age-related cataract, bilateral: Secondary | ICD-10-CM | POA: Diagnosis not present

## 2020-10-11 ENCOUNTER — Ambulatory Visit (INDEPENDENT_AMBULATORY_CARE_PROVIDER_SITE_OTHER): Payer: Medicare PPO | Admitting: Otolaryngology

## 2020-10-11 ENCOUNTER — Encounter (INDEPENDENT_AMBULATORY_CARE_PROVIDER_SITE_OTHER): Payer: Self-pay | Admitting: Otolaryngology

## 2020-10-11 ENCOUNTER — Other Ambulatory Visit: Payer: Self-pay

## 2020-10-11 VITALS — Temp 97.3°F

## 2020-10-11 DIAGNOSIS — H6121 Impacted cerumen, right ear: Secondary | ICD-10-CM

## 2020-10-11 DIAGNOSIS — H903 Sensorineural hearing loss, bilateral: Secondary | ICD-10-CM | POA: Diagnosis not present

## 2020-10-11 NOTE — Progress Notes (Signed)
HPI: Alexandria Wallace is a 79 y.o. female who presents is referred by [facility] for evaluation of pain and wax buildup in her right ear.  She was getting hearing aids fitted and had difficulty placing the hearing aid in the right ear which caused secondary pain.  She was referred here for further evaluation..  Past Medical History:  Diagnosis Date  . High cholesterol   . Mood disorder [facility])    Past Surgical History:  Procedure Laterality Date  . COLECTOMY    . EUS N/A 11/29/2015   Procedure: LOWER ENDOSCOPIC ULTRASOUND (EUS);  Surgeon: Arta Silence, MD;  Location: [facility] ENDOSCOPY;  Service: Endoscopy;  Laterality: N/A;  . FLEXIBLE SIGMOIDOSCOPY N/A 01/27/2018   Procedure: FLEXIBLE SIGMOIDOSCOPY;  Surgeon: Otis Brace, MD;  Location: [place];  Service: Gastroenterology;  Laterality: N/A;  . LEFT HEART CATH AND CORONARY ANGIOGRAPHY N/A 11/15/2018   Procedure: LEFT HEART CATH AND CORONARY ANGIOGRAPHY;  Surgeon: Troy Sine, MD;  Location: [place] CV LAB;  Service: Cardiovascular;  Laterality: N/A;  . POLYPECTOMY  02/19/2016   Social History   Socioeconomic History  . Marital status: Legally Separated    Spouse name: Not on file  . Number of children: 2  . Years of education: master's  . Highest education level: Not on file  Occupational History  . Occupation: retired Pharmacist, hospital  Tobacco Use  . Smoking status: Never Smoker  . Smokeless tobacco: Never Used  Substance and Sexual Activity  . Alcohol use: Yes    Alcohol/week: 2.0 standard drinks    Types: 2 Glasses of wine per week  . Drug use: No  . Sexual activity: Not on file  Other Topics Concern  . Not on file  Social History Narrative   Exercise - low impact aerobics, weight class, walking 4-5x/week for 1 hour sessions   Epworth Sleepiness Scale = 6 (11/11/16)   Social Determinants of Health   Financial Resource Strain:   . Difficulty of Paying Living Expenses: Not on file  Food Insecurity:   .  Worried About Charity fundraiser in the Last Year: Not on file  . Ran Out of Food in the Last Year: Not on file  Transportation Needs:   . Lack of Transportation (Medical): Not on file  . Lack of Transportation (Non-Medical): Not on file  Physical Activity:   . Days of Exercise per Week: Not on file  . Minutes of Exercise per Session: Not on file  Stress:   . Feeling of Stress : Not on file  Social Connections:   . Frequency of Communication with Friends and Family: Not on file  . Frequency of Social Gatherings with Friends and Family: Not on file  . Attends Religious Services: Not on file  . Active Member of Clubs or Organizations: Not on file  . Attends Archivist Meetings: Not on file  . Marital Status: Not on file   Family History  Problem Relation Age of Onset  . Heart attack Mother   . Cancer Father   . Cancer Sister 15  . Other Brother 10       meningitis    Allergies  Allergen Reactions  . Chocolate     Stated by patient from blood work   Prior to Admission medications   Medication Sig Start Date End Date Taking? Authorizing Provider  acetaminophen (TYLENOL) 500 MG tablet Take 500 mg by mouth every 6 (six) hours as needed (for pain.).   Yes [provider]  Ascorbic Acid (VITAMIN C) 1000 MG tablet Take 1,000 mg by mouth every evening.   Yes [provider]  busPIRone (BUSPAR) 10 MG tablet Take 10 mg by mouth daily.    Yes [provider]  Cholecalciferol (VITAMIN D3) 75 MCG (3000 UT) TABS Take 3,000 Units by mouth daily.   Yes [provider]  Coenzyme Q10 (CO Q10) 100 MG CAPS Take 100 mg by mouth every evening.    Yes [provider]  FLUoxetine (PROZAC) 10 MG capsule Take 10 mg by mouth daily.   Yes [provider]  Methylfol-Algae-B12-Acetylcyst (CEREFOLIN NAC) 6-90.314-2-600 MG TABS Take 1 tablet by mouth 2 (two) times daily.   Yes [provider]  Omega-3 Fatty Acids (FISH OIL) 1000 MG CAPS  Take 1,000 mg by mouth at bedtime.   Yes [provider]  ondansetron (ZOFRAN ODT) 4 MG disintegrating tablet Take 1 tablet (4 mg total) by mouth every 8 (eight) hours as needed for nausea or vomiting. 06/08/20  Yes Irene Pap N, DO  polyethylene glycol (MIRALAX) 17 g packet Take 17 g by mouth daily as needed. 06/08/20  Yes Hall, Carole N, DO  TURMERIC PO Take 448 mg by mouth every evening.   Yes [provider]  vitamin B-12 (CYANOCOBALAMIN) 1000 MCG tablet Take 1,000 mcg by mouth daily.   Yes [provider]     Positive ROS: Otherwise negative  All other systems have been reviewed and were otherwise negative with the exception of those mentioned in the HPI and as above.  Physical Exam: Constitutional: Alert, well-appearing, no acute distress Ears: External ears without lesions or tenderness.  Left ear canal and TM are clear.  Right ear canal is completely occluded with a large ball of firm wax that was removed with a curette.  Ear canal had mild erythema but the TM was clear. Nasal: External nose without lesions. . Clear nasal passages Oral: Lips and gums without lesions. Tongue and palate mucosa without lesions. Posterior oropharynx clear. Neck: No palpable adenopathy or masses Respiratory: Breathing comfortably  Skin: No facial/neck lesions or rash noted.  Cerumen impaction removal  Date/Time: 10/11/2020 2:55 PM Performed by: Rozetta Nunnery, MD Authorized by: Rozetta Nunnery, MD   Consent:    Consent obtained:  Verbal   Consent given by:  Patient   Risks discussed:  Pain and bleeding Procedure details:    Location:  R ear   Procedure type: curette   Post-procedure details:    Inspection:  TM intact and canal normal   Hearing quality:  Improved   Patient tolerance of procedure:  Tolerated well, no immediate complications Comments:     TMs are clear bilaterally    Assessment: Right ear canal cerumen impaction Bilateral hearing  loss wearing hearing aids.  Plan: She will follow-up as needed After removal of the earwax ear canal was otherwise normal.   Radene Journey, MD   CC:

## 2021-01-01 DIAGNOSIS — F310 Bipolar disorder, current episode hypomanic: Secondary | ICD-10-CM | POA: Diagnosis not present

## 2021-01-17 DIAGNOSIS — H9190 Unspecified hearing loss, unspecified ear: Secondary | ICD-10-CM | POA: Diagnosis not present

## 2021-01-17 DIAGNOSIS — E559 Vitamin D deficiency, unspecified: Secondary | ICD-10-CM | POA: Diagnosis not present

## 2021-01-17 DIAGNOSIS — M810 Age-related osteoporosis without current pathological fracture: Secondary | ICD-10-CM | POA: Diagnosis not present

## 2021-01-17 DIAGNOSIS — Z79899 Other long term (current) drug therapy: Secondary | ICD-10-CM | POA: Diagnosis not present

## 2021-01-17 DIAGNOSIS — Z0000 Encounter for general adult medical examination without abnormal findings: Secondary | ICD-10-CM | POA: Diagnosis not present

## 2021-01-17 DIAGNOSIS — R7989 Other specified abnormal findings of blood chemistry: Secondary | ICD-10-CM | POA: Diagnosis not present

## 2021-01-17 DIAGNOSIS — R413 Other amnesia: Secondary | ICD-10-CM | POA: Diagnosis not present

## 2021-01-27 DIAGNOSIS — E7849 Other hyperlipidemia: Secondary | ICD-10-CM | POA: Diagnosis not present

## 2021-01-30 DIAGNOSIS — I2510 Atherosclerotic heart disease of native coronary artery without angina pectoris: Secondary | ICD-10-CM | POA: Diagnosis not present

## 2021-01-30 DIAGNOSIS — R7989 Other specified abnormal findings of blood chemistry: Secondary | ICD-10-CM | POA: Diagnosis not present

## 2021-01-30 DIAGNOSIS — R031 Nonspecific low blood-pressure reading: Secondary | ICD-10-CM | POA: Diagnosis not present

## 2021-01-30 DIAGNOSIS — G4733 Obstructive sleep apnea (adult) (pediatric): Secondary | ICD-10-CM | POA: Diagnosis not present

## 2021-01-30 DIAGNOSIS — E559 Vitamin D deficiency, unspecified: Secondary | ICD-10-CM | POA: Diagnosis not present

## 2021-01-30 DIAGNOSIS — F39 Unspecified mood [affective] disorder: Secondary | ICD-10-CM | POA: Diagnosis not present

## 2021-01-30 DIAGNOSIS — E7849 Other hyperlipidemia: Secondary | ICD-10-CM | POA: Diagnosis not present

## 2021-01-30 DIAGNOSIS — M810 Age-related osteoporosis without current pathological fracture: Secondary | ICD-10-CM | POA: Diagnosis not present

## 2021-01-30 DIAGNOSIS — N183 Chronic kidney disease, stage 3 unspecified: Secondary | ICD-10-CM | POA: Diagnosis not present

## 2021-02-17 ENCOUNTER — Ambulatory Visit: Payer: Medicare PPO | Admitting: Internal Medicine

## 2021-02-17 ENCOUNTER — Encounter: Payer: Self-pay | Admitting: Internal Medicine

## 2021-02-17 ENCOUNTER — Other Ambulatory Visit: Payer: Self-pay

## 2021-02-17 ENCOUNTER — Telehealth: Payer: Self-pay | Admitting: Internal Medicine

## 2021-02-17 VITALS — BP 114/64 | HR 68 | Ht 64.0 in | Wt 140.6 lb

## 2021-02-17 DIAGNOSIS — E7849 Other hyperlipidemia: Secondary | ICD-10-CM | POA: Diagnosis not present

## 2021-02-17 DIAGNOSIS — I2510 Atherosclerotic heart disease of native coronary artery without angina pectoris: Secondary | ICD-10-CM

## 2021-02-17 DIAGNOSIS — T466X5D Adverse effect of antihyperlipidemic and antiarteriosclerotic drugs, subsequent encounter: Secondary | ICD-10-CM

## 2021-02-17 DIAGNOSIS — M791 Myalgia, unspecified site: Secondary | ICD-10-CM | POA: Diagnosis not present

## 2021-02-17 DIAGNOSIS — R931 Abnormal findings on diagnostic imaging of heart and coronary circulation: Secondary | ICD-10-CM

## 2021-02-17 NOTE — Progress Notes (Signed)
OFFICE NOTE  Chief Complaint:  Follow-up  Primary Care Physician: Alexandria Caraway, MD  HPI:  Alexandria Wallace is a 80 y.o. female who I previously saw in 2012 for evaluation of heart murmur. She underwent an echocardiogram at that time which showed an EF of greater than [id number], mild diastolic dysfunction, mild to moderate TR with an RVSP of 40 mmHg, and mild aortic sclerosis without stenosis. She also underwent a stress test which showed no reversible ischemia. She denies any chest pain or worsening shortness of breath. Recently she saw her primary care provider who noted a heart murmur which was not previously appreciated. She was referred for reevaluation. Alexandria Wallace reports doing well. She is quite active physically. Although she takes medications for mood disorder and has some mildly elevated cholesterol, she has no other cardiac issues that were aware of. Cholesterol is treated by her primary care provider and she is on zetia. Her mother died at age 60 of an MI but there is no other coronary disease in the family.  09/01/2018  Alexandria Wallace returns today for follow-up.  I have only seen her twice in the last 7 years.  Initially in 2012 for evaluation of heart murmur.  Echo showed some TR and mild aortic sclerosis.  I last saw her in 2017 and a repeat echo in 2018 showed mild aortic sclerosis with normal systolic function and no significant valvular abnormalities.  She mentioned that she had a history of elevated cholesterol in the past but was hesitant to take statins.  She denied any history of heart disease in her family and noted that her mother died at age 76 and did have an MRI.  Her father actually died in his 12s from cancer and is unclear whether he had any coronary disease.  I did receive lab work from her PCP at [facility], Dr. Addison Wallace that indicated a severe dyslipidemia.  In fact in June 2019 her total cholesterol was 321, triglycerides 144, HDL 56 and LDL-C of 236.  Non-HDL  cholesterol 265.  This is in the setting of ezetimibe.  When I press her more on this she says that actually she was given the diagnosis of a genetic dyslipidemia when she spent some time in [place].  Her daughter apparently is an Systems developer and also practices herbal medications and has convinced her that statin therapy is potentially harmful.  Alexandria Wallace was on Crestor for short period of time and apparently tolerated it with an appropriate decrease in her cholesterol.  These findings are highly suggestive of FH and certainly raise my concern that her symptoms may be due to significant coronary artery disease.  She had last had stress testing back in 2012 which was negative for ischemia.  Currently she gets short of breath when doing moderate to significant exertion, such as walking upstairs or doing water aerobics.  She also notes an associated drop in blood pressure with these episodes.  She denies specific chest pain or pressure.  10/04/2018  Alexandria Wallace returns today for follow-up of her CT angiogram.  As noted above she has a significant history of dyslipidemia with likely FH and has recently had shortness of breath with moderate to significant exertion.  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  There is evidence of collaterals from the distal LAD.  The left coronary arteries indicated mild to moderate nonobstructive coronary  disease with mixed soft and calcified plaque.  The LAD is noted to be a large vessel that wraps around the apex.  A PFO is suspected with left-to-right shunt.  Overall the findings could explain her symptoms however currently she is free of angina.  Her dyspnea is fairly stable and tolerated.  She is started taking Crestor but does feel that she has some side effects including cramping which is been intermittent.  She would like to  lower her dose.  She understands that she is way above her goal LDL less than 70 and will likely need additional medication.  Because of the abnormal CT findings, we discussed possible heart catheterization for definitive evaluation of her symptoms.  Although the right coronary could be occluded this appears to be a small, nondominant vessel and intervention would not likely be beneficial.  11/10/2018  Alexandria Wallace is seen today in follow-up.  This is an unscheduled visit for chest pain.  Recently as described above we had performed a CT coronary angiogram which did show either anomalous right coronary artery or possibly obstruction with collaterals that seem to come from the distal LAD.  It was unclear whether there was significant obstruction.  At that time she had been having fatigue and shortness of breath.  Now she reports her shortness of breath is worsened and she developed chest pain.  This is described as sharp and lasted for several minutes across her back and shoulders, while exerting and exercising.  Apparently she was doing some lap pull downs suggesting this could be musculoskeletal, but she has had some other episodes with exertion which were concerning.  Overall she feels unwell and thinks that her symptoms are progressively worsening.  Her EKG however is normal today.  12/16/2018  Alexandria Wallace is seen today in follow-up.  She underwent left heart catheterization for persistent chest discomfort, fatigue and shortness of breath.  Her CT coronary angiogram suggested possible diminutive right coronary artery versus ostial obstruction.  Fortunately the cath showed a diminutive right coronary with a large left circulation and minimal, nonobstructive coronary disease with normal LV function.  She had significant dyslipidemia but has been on both ezetimibe and rosuvastatin 40 mg.  She recently had a repeat lipid profile on December 08, 2018.  Total cholesterol is 146, triglycerides 98, HDL 52 and LDL  74.  This represents a marked improvement and is very near goal LDL less than 70.  06/12/2020  Alexandria Wallace returns today for follow-up of recent hospitalization.  She was seen in consultation on July 9 by Dr. Martinique for elevated troponin.  She was initially admitted for hypotension and elevated lactate concerning for sepsis.  There was some vomiting but not protracted.  Ultimately cultures were negative and the source of her illness was not apparent.  She had complained of headache however CSF studies were negative for meningitis.  There was significant increase in troponin to 1500 however the echo was normal with normal wall motion.  There was some mild diffuse ST depression suggesting this may have been subendocardial ischemia.  Based on her recent cath in 2019 there was minimal nonobstructive coronary disease.  It was not felt that this was a true non-STEMI.  On follow-up today she denies any chest pain or worsening shortness of breath.  She reports recent auto diuresis of fluids that she was given during her hospitalization.  She is not on a diuretic.  She has stopped her statins and had previously had good LDL at 74 but  prior to that in the past has had an LDL well over 200 apparently in [place].  She has been working with an integrative medicine physician there.  She stopped the statins because of significant myalgias.  Given the high LDL cholesterol, begs the question is whether she may have a familial hyperlipidemia however given her minimal coronary disease and her age I think that is unlikely.  02/17/2021  Alexandria Wallace is seen today in follow-up.  Overall she says she feels fairly stable.  She denies any chest pain or shortness of breath.  Fortunately she has had no further hospitalizations for sepsis.  Blood pressures well controlled today.  Her weight is normal.  EKG shows normal sinus rhythm.  She just saw her PCP who did lab work and her cholesterol remains high.  Total is 281, HDL 70, LDL 201  and triglycerides 65.  She has been intolerant to statins in the past having caused myalgias and concerns about memory loss.  She says she will not take them.  We try to discuss other options today for a possible familial hyperlipidemia.  Her goal per current guidelines is greater than 50% reduction in LDL.  She was noted to have coronary artery disease on her last heart catheterization.  I think she is a good candidate for a PCSK9 inhibitor.  PMHx:  Past Medical History:  Diagnosis Date  . High cholesterol   . Mood disorder [facility])     Past Surgical History:  Procedure Laterality Date  . COLECTOMY    . EUS N/A 11/29/2015   Procedure: LOWER ENDOSCOPIC ULTRASOUND (EUS);  Surgeon: Arta Silence, MD;  Location: [facility] ENDOSCOPY;  Service: Endoscopy;  Laterality: N/A;  . FLEXIBLE SIGMOIDOSCOPY N/A 01/27/2018   Procedure: FLEXIBLE SIGMOIDOSCOPY;  Surgeon: Otis Brace, MD;  Location: [place];  Service: Gastroenterology;  Laterality: N/A;  . LEFT HEART CATH AND CORONARY ANGIOGRAPHY N/A 11/15/2018   Procedure: LEFT HEART CATH AND CORONARY ANGIOGRAPHY;  Surgeon: Troy Sine, MD;  Location: [place] CV LAB;  Service: Cardiovascular;  Laterality: N/A;  . POLYPECTOMY  02/19/2016    FAMHx:  Family History  Problem Relation Age of Onset  . Heart attack Mother   . Cancer Father   . Cancer Sister 70  . Other Brother 10       meningitis     SOCHx:   reports that she has never smoked. She has never used smokeless tobacco. She reports current alcohol use of about 2.0 standard drinks of alcohol per week. She reports that she does not use drugs.  ALLERGIES:  Allergies  Allergen Reactions  . Chocolate     Stated by patient from blood work    ROS: Pertinent items noted in HPI and remainder of comprehensive ROS otherwise negative.  HOME MEDS: Current Outpatient Medications on File Prior to Visit  Medication Sig Dispense Refill  . acetaminophen (TYLENOL) 500 MG tablet Take 500 mg by  mouth every 6 (six) hours as needed (for pain.).    Marland Kitchen Ascorbic Acid (VITAMIN C) 1000 MG tablet Take 1,000 mg by mouth every evening.    . busPIRone (BUSPAR) 10 MG tablet Take 10 mg by mouth daily.     . Cholecalciferol (VITAMIN D3) 75 MCG (3000 UT) TABS Take 3,000 Units by mouth daily.    . Coenzyme Q10 (CO Q10) 100 MG CAPS Take 100 mg by mouth every evening.     Marland Kitchen FLUoxetine (PROZAC) 10 MG capsule Take 10 mg by mouth daily.    Marland Kitchen  ibandronate (BONIVA) 150 MG tablet 1 tablet    . Methylfol-Algae-B12-Acetylcyst (CEREFOLIN NAC) 6-90.314-2-600 MG TABS Take 1 tablet by mouth 2 (two) times daily.    . nitroGLYCERIN (NITROSTAT) 0.4 MG SL tablet 1 tab at onset of chest pain, may repeat once after 5 minutes    . Omega-3 Fatty Acids (FISH OIL) 1000 MG CAPS Take 1,000 mg by mouth at bedtime.    . ondansetron (ZOFRAN ODT) 4 MG disintegrating tablet Take 1 tablet (4 mg total) by mouth every 8 (eight) hours as needed for nausea or vomiting. 20 tablet 0  . polyethylene glycol (MIRALAX) 17 g packet Take 17 g by mouth daily as needed. 14 each 0  . TURMERIC PO Take 448 mg by mouth every evening.    . vitamin B-12 (CYANOCOBALAMIN) 1000 MCG tablet Take 1,000 mcg by mouth daily.     No current facility-administered medications on file prior to visit.    LABS/IMAGING: No results found for this or any previous visit (from the past 48 hour(s)). No results found.  WEIGHTS: Wt Readings from Last 3 Encounters:  02/17/21 140 lb 9.6 oz (63.8 kg)  06/12/20 143 lb (64.9 kg)  06/08/20 159 lb 2.8 oz (72.2 kg)    VITALS: BP 114/64   Pulse 68   Ht 5\' 4"  (1.626 m)   Wt 140 lb 9.6 oz (63.8 kg)   SpO2 98%   BMI 24.13 kg/m   EXAM: General appearance: alert and no distress Neck: no carotid bruit, no JVD and thyroid not enlarged, symmetric, no tenderness/mass/nodules Lungs: clear to auscultation bilaterally Heart: regular rate and rhythm Abdomen: soft, non-tender; bowel sounds normal; no masses,  no  organomegaly Extremities: extremities normal, atraumatic, no cyanosis or edema Pulses: 2+ and symmetric Skin: Skin color, texture, turgor normal. No rashes or lesions Neurologic: Grossly normal Psych: Pleasant  EKG: Normal sinus rhythm at 68, possible left atrial enlargement-personally reviewed  ASSESSMENT: 1. Admission for possible sepsis with elevated troponin and normal echo findings (05/2020) 2. Unstable angina -mild nonobstructive coronary disease with a large left system and diminutive right coronary artery by cath (11/2018) 3. Fatigue, DOE, hypotension -abnormal cardiac CT with mild to moderate coronary disease and possibly occluded right coronary artery with distal collaterals (08/2018) 4. 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 Aortic sclerosis 6. Possible familial hyperlipidemia 7. Statin intolerance-myalgia  PLAN: 1.   Mrs. Canales seems to be doing better although has a possible familial hyperlipidemia.  LDL remains above 200.  She is noted to have mild nonobstructive coronary artery disease by cath in 2020 and had a non-STEMI.  She has aortic sclerosis as well.  I think she would benefit from a PCSK9 inhibitor since she has been intolerant to statins.  We discussed Repatha today and demonstrated in the office.  Would recommend 140 mg every 2 weeks.  We will reach out for prior authorization.  She might qualify for health well grant.  Plan repeat lipids in about 3 to 4 months and follow-up with me then.  Pixie Casino, MD, [facility], [place] Director of the Advanced Lipid Disorders &  Cardiovascular Risk Reduction Clinic Diplomate of the American Board of Clinical Lipidology Attending Cardiologist  Direct Dial: [phone]  Fax: [phone]  Website:  [email or web address] 02/17/2021, 8:37 AM

## 2021-02-17 NOTE — Patient Instructions (Signed)
Medication Instructions:  Dr. Debara Pickett recommends Repatha 140mg /mL (PCSK9). This is an injectable cholesterol medication self-administered once every 14 days. This medication will likely need prior approval with your insurance company, which we will work on. If the medication is not approved initially, we may need to do an appeal with your insurance.   Administer medication in area of fatty tissue such as abdomen, outer thigh, back of upper arm - and rotate site with each injection Store medication in refrigerator until ready to administer - allow to sit at room temp for 30 mins - 1 hour prior to injection Dispose of medication in a SHARPS container - your pharmacy should be able to direct you on this and proper disposal   Patient Assistance:  The Health Well foundation offers assistance to help pay for medication copays.  They will cover copays for all cholesterol lowering meds, including statins, fibrates, omega-3 oils, ezetimibe, Repatha, Praluent, Nexletol, Nexlizet.  The cards are usually good for $2,500 or 12 months, whichever comes first. 1. Go to healthwellfoundation.org 2. Click on "Apply Now" 3. Answer questions as to whom is applying (patient or representative) 4. Your disease fund will be "hypercholesterolemia - Medicare access" 5. They will ask questions about finances and which medications you are taking for cholesterol 6. When you submit, the approval is usually within minutes.  You will need to print the card information from the site 7. You will need to show this information to your pharmacy, they will bill your Medicare Part D plan first -then bill Health Well --for the copay.   You can also call them at [phone], although the hold times can be quite long.   Thank you for choosing [facility]    *If you need a refill on your cardiac medications before your next appointment, please call your pharmacy*   Lab Work: FASTING lab work in 3-4 months to check cholesterol   If  you have labs (blood work) drawn today and your tests are completely normal, you will receive your results only by: Marland Kitchen MyChart Message (if you have MyChart) OR . A paper copy in the mail If you have any lab test that is abnormal or we need to change your treatment, we will call you to review the results.   Testing/Procedures: NONE   Follow-Up: At [facility], you and your health needs are our priority.  As part of our continuing mission to provide you with exceptional heart care, we have created designated Provider Care Teams.  These Care Teams include your primary Cardiologist (physician) and Advanced Practice Providers (APPs -  Physician Assistants and Nurse Practitioners) who all work together to provide you with the care you need, when you need it.  We recommend signing up for the patient portal called "MyChart".  Sign up information is provided on this After Visit Summary.  MyChart is used to connect with patients for Virtual Visits (Telemedicine).  Patients are able to view lab/test results, encounter notes, upcoming appointments, etc.  Non-urgent messages can be sent to your provider as well.   To learn more about what you can do with MyChart, go to [email or web address].    Your next appointment:   4 month(s)  The format for your next appointment:   In Person  Provider:   K. Mali Hilty, MD   Other Instructions

## 2021-02-17 NOTE — Telephone Encounter (Signed)
PA for repatha sureclick submitted via CMM (Key: [id number])

## 2021-02-17 NOTE — Telephone Encounter (Signed)
Repatha approved 08/16/2021

## 2021-02-19 MED ORDER — REPATHA SURECLICK 140 MG/ML ~~LOC~~ SOAJ: 1.0000 | SUBCUTANEOUS | 11 refills | Status: DC

## 2021-02-19 NOTE — Telephone Encounter (Signed)
Patient agreeable to trying repatha. Rx(s) sent to pharmacy electronically.

## 2021-02-19 NOTE — Addendum Note (Signed)
Addended by: Fidel Levy on: 02/19/2021 08:04 AM   Modules accepted: Orders

## 2021-02-19 NOTE — Telephone Encounter (Signed)
Great news - thanks!  Dr Lemmie Evens

## 2021-02-21 ENCOUNTER — Ambulatory Visit: Payer: Medicare PPO | Admitting: Internal Medicine

## 2021-04-07 DIAGNOSIS — H25043 Posterior subcapsular polar age-related cataract, bilateral: Secondary | ICD-10-CM | POA: Diagnosis not present

## 2021-04-07 DIAGNOSIS — H40013 Open angle with borderline findings, low risk, bilateral: Secondary | ICD-10-CM | POA: Diagnosis not present

## 2021-04-07 DIAGNOSIS — H25013 Cortical age-related cataract, bilateral: Secondary | ICD-10-CM | POA: Diagnosis not present

## 2021-04-07 DIAGNOSIS — H2513 Age-related nuclear cataract, bilateral: Secondary | ICD-10-CM | POA: Diagnosis not present

## 2021-07-01 DIAGNOSIS — F310 Bipolar disorder, current episode hypomanic: Secondary | ICD-10-CM | POA: Diagnosis not present

## 2021-07-02 DIAGNOSIS — E7849 Other hyperlipidemia: Secondary | ICD-10-CM | POA: Diagnosis not present

## 2021-07-02 DIAGNOSIS — I2510 Atherosclerotic heart disease of native coronary artery without angina pectoris: Secondary | ICD-10-CM | POA: Diagnosis not present

## 2021-07-02 LAB — LIPID PANEL
Chol/HDL Ratio: 3.4 ratio (ref 0.0–4.4)
Cholesterol, Total: 244 mg/dL — ABNORMAL HIGH (ref 100–199)
HDL: 71 mg/dL (ref >39)
LDL Chol Calc (NIH): 161 mg/dL — ABNORMAL HIGH (ref 0–99)
Triglycerides: 73 mg/dL (ref 0–149)
VLDL Cholesterol Cal: 12 mg/dL (ref 5–40)

## 2021-07-09 ENCOUNTER — Encounter: Payer: Self-pay | Admitting: Internal Medicine

## 2021-07-09 ENCOUNTER — Other Ambulatory Visit: Payer: Self-pay

## 2021-07-09 ENCOUNTER — Ambulatory Visit: Payer: Medicare PPO | Admitting: Internal Medicine

## 2021-07-09 ENCOUNTER — Telehealth: Payer: Self-pay | Admitting: Internal Medicine

## 2021-07-09 VITALS — BP 115/60 | HR 81 | Ht 64.0 in | Wt 141.0 lb

## 2021-07-09 DIAGNOSIS — T466X5D Adverse effect of antihyperlipidemic and antiarteriosclerotic drugs, subsequent encounter: Secondary | ICD-10-CM

## 2021-07-09 DIAGNOSIS — M791 Myalgia, unspecified site: Secondary | ICD-10-CM | POA: Diagnosis not present

## 2021-07-09 DIAGNOSIS — I2510 Atherosclerotic heart disease of native coronary artery without angina pectoris: Secondary | ICD-10-CM | POA: Diagnosis not present

## 2021-07-09 DIAGNOSIS — R931 Abnormal findings on diagnostic imaging of heart and coronary circulation: Secondary | ICD-10-CM | POA: Diagnosis not present

## 2021-07-09 DIAGNOSIS — E7849 Other hyperlipidemia: Secondary | ICD-10-CM

## 2021-07-09 NOTE — Patient Instructions (Signed)
Medication Instructions:  Your physician recommends that you continue on your current medications as directed. Please refer to the Current Medication list given to you today.  *If you need a refill on your cardiac medications before your next appointment, please call your pharmacy*   Lab Work: LAB TEST TODAY - LP(a)  FASTING LAB TEST in 3-4 months -- complete about 1 week before your next visit  If you have labs (blood work) drawn today and your tests are completely normal, you will receive your results only by: [place] (if you have MyChart) OR A paper copy in the mail If you have any lab test that is abnormal or we need to change your treatment, we will call you to review the results.   Testing/Procedures: Genetic Test (cheek swab) completed today   Follow-Up: At [facility], you and your health needs are our priority.  As part of our continuing mission to provide you with exceptional heart care, we have created designated Provider Care Teams.  These Care Teams include your primary Cardiologist (physician) and Advanced Practice Providers (APPs -  Physician Assistants and Nurse Practitioners) who all work together to provide you with the care you need, when you need it.  We recommend signing up for the patient portal called "MyChart".  Sign up information is provided on this After Visit Summary.  MyChart is used to connect with patients for Virtual Visits (Telemedicine).  Patients are able to view lab/test results, encounter notes, upcoming appointments, etc.  Non-urgent messages can be sent to your provider as well.   To learn more about what you can do with MyChart, go to [email or web address].    Your next appointment:   3-4 months (lipid clinic)  The format for your next appointment:   In Person  Provider:   K. Mali Hilty, MD   Other Instructions

## 2021-07-09 NOTE — Telephone Encounter (Signed)
Genetic test for familial hyperlipidemia completed during 07/09/21 appointment She is aware results should be available in about 3 weeks & once MD reviews, she will be notified

## 2021-07-09 NOTE — Progress Notes (Addendum)
OFFICE NOTE  Chief Complaint:  Follow-up  Primary Care Physician: Alexandria Caraway, MD  HPI:  Alexandria Wallace is a 80 y.o. female who I previously saw in 2012 for evaluation of heart murmur. She underwent an echocardiogram at that time which showed an EF of greater than [id number], mild diastolic dysfunction, mild to moderate TR with an RVSP of 40 mmHg, and mild aortic sclerosis without stenosis. She also underwent a stress test which showed no reversible ischemia. She denies any chest pain or worsening shortness of breath. Recently she saw her primary care provider who noted a heart murmur which was not previously appreciated. She was referred for reevaluation. Alexandria Wallace reports doing well. She is quite active physically. Although she takes medications for mood disorder and has some mildly elevated cholesterol, she has no other cardiac issues that were aware of. Cholesterol is treated by her primary care provider and she is on zetia. Her mother died at age 60 of an MI but there is no other coronary disease in the family.  09/01/2018  Alexandria Wallace returns today for follow-up.  I have only seen her twice in the last 7 years.  Initially in 2012 for evaluation of heart murmur.  Echo showed some TR and mild aortic sclerosis.  I last saw her in 2017 and a repeat echo in 2018 showed mild aortic sclerosis with normal systolic function and no significant valvular abnormalities.  She mentioned that she had a history of elevated cholesterol in the past but was hesitant to take statins.  She denied any history of heart disease in her family and noted that her mother died at age 76 and did have an MRI.  Her father actually died in his 12s from cancer and is unclear whether he had any coronary disease.  I did receive lab work from her PCP at [facility], Dr. Addison Wallace that indicated a severe dyslipidemia.  In fact in June 2019 her total cholesterol was 321, triglycerides 144, HDL 56 and LDL-C of 236.  Non-HDL  cholesterol 265.  This is in the setting of ezetimibe.  When I press her more on this she says that actually she was given the diagnosis of a genetic dyslipidemia when she spent some time in [place].  Her daughter apparently is an Systems developer and also practices herbal medications and has convinced her that statin therapy is potentially harmful.  Alexandria Wallace was on Crestor for short period of time and apparently tolerated it with an appropriate decrease in her cholesterol.  These findings are highly suggestive of FH and certainly raise my concern that her symptoms may be due to significant coronary artery disease.  She had last had stress testing back in 2012 which was negative for ischemia.  Currently she gets short of breath when doing moderate to significant exertion, such as walking upstairs or doing water aerobics.  She also notes an associated drop in blood pressure with these episodes.  She denies specific chest pain or pressure.  10/04/2018  Alexandria Wallace returns today for follow-up of her CT angiogram.  As noted above she has a significant history of dyslipidemia with likely FH and has recently had shortness of breath with moderate to significant exertion.  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  There is evidence of collaterals from the distal LAD.  The left coronary arteries indicated mild to moderate nonobstructive coronary  disease with mixed soft and calcified plaque.  The LAD is noted to be a large vessel that wraps around the apex.  A PFO is suspected with left-to-right shunt.  Overall the findings could explain her symptoms however currently she is free of angina.  Her dyspnea is fairly stable and tolerated.  She is started taking Crestor but does feel that she has some side effects including cramping which is been intermittent.  She would like to  lower her dose.  She understands that she is way above her goal LDL less than 70 and will likely need additional medication.  Because of the abnormal CT findings, we discussed possible heart catheterization for definitive evaluation of her symptoms.  Although the right coronary could be occluded this appears to be a small, nondominant vessel and intervention would not likely be beneficial.  11/10/2018  Alexandria Wallace is seen today in follow-up.  This is an unscheduled visit for chest pain.  Recently as described above we had performed a CT coronary angiogram which did show either anomalous right coronary artery or possibly obstruction with collaterals that seem to come from the distal LAD.  It was unclear whether there was significant obstruction.  At that time she had been having fatigue and shortness of breath.  Now she reports her shortness of breath is worsened and she developed chest pain.  This is described as sharp and lasted for several minutes across her back and shoulders, while exerting and exercising.  Apparently she was doing some lap pull downs suggesting this could be musculoskeletal, but she has had some other episodes with exertion which were concerning.  Overall she feels unwell and thinks that her symptoms are progressively worsening.  Her EKG however is normal today.  12/16/2018  Alexandria Wallace is seen today in follow-up.  She underwent left heart catheterization for persistent chest discomfort, fatigue and shortness of breath.  Her CT coronary angiogram suggested possible diminutive right coronary artery versus ostial obstruction.  Fortunately the cath showed a diminutive right coronary with a large left circulation and minimal, nonobstructive coronary disease with normal LV function.  She had significant dyslipidemia but has been on both ezetimibe and rosuvastatin 40 mg.  She recently had a repeat lipid profile on December 08, 2018.  Total cholesterol is 146, triglycerides 98, HDL 52 and LDL  74.  This represents a marked improvement and is very near goal LDL less than 70.  06/12/2020  Alexandria Wallace returns today for follow-up of recent hospitalization.  She was seen in consultation on July 9 by Dr. Martinique for elevated troponin.  She was initially admitted for hypotension and elevated lactate concerning for sepsis.  There was some vomiting but not protracted.  Ultimately cultures were negative and the source of her illness was not apparent.  She had complained of headache however CSF studies were negative for meningitis.  There was significant increase in troponin to 1500 however the echo was normal with normal wall motion.  There was some mild diffuse ST depression suggesting this may have been subendocardial ischemia.  Based on her recent cath in 2019 there was minimal nonobstructive coronary disease.  It was not felt that this was a true non-STEMI.  On follow-up today she denies any chest pain or worsening shortness of breath.  She reports recent auto diuresis of fluids that she was given during her hospitalization.  She is not on a diuretic.  She has stopped her statins and had previously had good LDL at 74 but  prior to that in the past has had an LDL well over 200 apparently in [place].  She has been working with an integrative medicine physician there.  She stopped the statins because of significant myalgias.  Given the high LDL cholesterol, begs the question is whether she may have a familial hyperlipidemia however given her minimal coronary disease and her age I think that is unlikely.  02/17/2021  Alexandria Wallace is seen today in follow-up.  Overall she says she feels fairly stable.  She denies any chest pain or shortness of breath.  Fortunately she has had no further hospitalizations for sepsis.  Blood pressures well controlled today.  Her weight is normal.  EKG shows normal sinus rhythm.  She just saw her PCP who did lab work and her cholesterol remains high.  Total is 281, HDL 70, LDL 201  and triglycerides 65.  She has been intolerant to statins in the past having caused myalgias and concerns about memory loss.  She says she will not take them.  We try to discuss other options today for a possible familial hyperlipidemia.  Her goal per current guidelines is greater than 50% reduction in LDL.  She was noted to have coronary artery disease on her last heart catheterization.  I think she is a good candidate for a PCSK9 inhibitor.  07/09/2021  Alexandria Wallace returns today for follow-up.  She has been on Repatha since March.  She says she is tolerating well without side effects however her cholesterol is not significantly lower.  Is now with total cholesterol 244, triglycerides 73, HDL 71 and LDL 161.  Her LDL had been in 201 but this represents less than 65% reduction which is typical of the medication.  There may be some reasons for that including a high LP(a) which we have not tested for.  As previously mentioned she had LDL cholesterol over 200 in [place].  I was able to bring up some paperwork from 2015 which showed total cholesterol 302, triglycerides 153, HDL 58 and LDL 213.  She had lipoprotein phenotypic classification of 2 a which showed increased beta particles and normal prebeta and alpha particles with no chylomicrons.  This is more consistent with familial hyperlipidemia but this is not a genetic test.   PMHx:  Past Medical History:  Diagnosis Date   High cholesterol    Mood disorder [facility])     Past Surgical History:  Procedure Laterality Date   COLECTOMY     EUS N/A 11/29/2015   Procedure: LOWER ENDOSCOPIC ULTRASOUND (EUS);  Surgeon: Arta Silence, MD;  Location[facility] ENDOSCOPY;  Service: Endoscopy;  Laterality: N/A;   FLEXIBLE SIGMOIDOSCOPY N/A 01/27/2018   Procedure: FLEXIBLE SIGMOIDOSCOPY;  Surgeon: Otis Brace, MD;  Location: [facility] ENDOSCOPY;  Service: Gastroenterology;  Laterality: N/A;   LEFT HEART CATH AND CORONARY ANGIOGRAPHY N/A 11/15/2018   Procedure: LEFT HEART  CATH AND CORONARY ANGIOGRAPHY;  Surgeon: Troy Sine, MD;  Location: [place] CV LAB;  Service: Cardiovascular;  Laterality: N/A;   POLYPECTOMY  02/19/2016    FAMHx:  Family History  Problem Relation Age of Onset   Heart attack Mother    Cancer Father    Cancer Sister 63   Other Brother 66       meningitis     SOCHx:   reports that she has never smoked. She has never used smokeless tobacco. She reports current alcohol use of about 2.0 standard drinks of alcohol per week. She reports that she does not use drugs.  ALLERGIES:  Allergies  Allergen Reactions   Chocolate     Stated by patient from blood work    ROS: Pertinent items noted in HPI and remainder of comprehensive ROS otherwise negative.  HOME MEDS: Current Outpatient Medications on File Prior to Visit  Medication Sig Dispense Refill   acetaminophen (TYLENOL) 500 MG tablet Take 500 mg by mouth every 6 (six) hours as needed (for pain.).     Ascorbic Acid (VITAMIN C) 1000 MG tablet Take 1,000 mg by mouth every evening.     busPIRone (BUSPAR) 10 MG tablet Take 10 mg by mouth daily.      Cholecalciferol (VITAMIN D3) 75 MCG (3000 UT) TABS Take 3,000 Units by mouth daily.     Coenzyme Q10 (CO Q10) 100 MG CAPS Take 100 mg by mouth every evening.      Evolocumab (REPATHA SURECLICK) XX123456 MG/ML SOAJ Inject 1 Dose into the skin every 14 (fourteen) days. 2 mL 11   FLUoxetine (PROZAC) 10 MG capsule Take 10 mg by mouth daily.     ibandronate (BONIVA) 150 MG tablet 1 tablet     Methylfol-Algae-B12-Acetylcyst (CEREFOLIN NAC) 6-90.314-2-600 MG TABS Take 1 tablet by mouth 2 (two) times daily.     nitroGLYCERIN (NITROSTAT) 0.4 MG SL tablet 1 tab at onset of chest pain, may repeat once after 5 minutes     Omega-3 Fatty Acids (FISH OIL) 1000 MG CAPS Take 1,000 mg by mouth at bedtime.     polyethylene glycol (MIRALAX) 17 g packet Take 17 g by mouth daily as needed. 14 each 0   TURMERIC PO Take 448 mg by mouth every evening.      ondansetron (ZOFRAN ODT) 4 MG disintegrating tablet Take 1 tablet (4 mg total) by mouth every 8 (eight) hours as needed for nausea or vomiting. (Patient not taking: Reported on 07/09/2021) 20 tablet 0   vitamin B-12 (CYANOCOBALAMIN) 1000 MCG tablet Take 1,000 mcg by mouth daily. (Patient not taking: Reported on 07/09/2021)     No current facility-administered medications on file prior to visit.    LABS/IMAGING: No results found for this or any previous visit (from the past 48 hour(s)). No results found.  WEIGHTS: Wt Readings from Last 3 Encounters:  07/09/21 141 lb (64 kg)  02/17/21 140 lb 9.6 oz (63.8 kg)  06/12/20 143 lb (64.9 kg)    VITALS: BP 115/60   Pulse 81   Ht '5\' 4"'$  (1.626 m)   Wt 141 lb (64 kg)   SpO2 98%   BMI 24.20 kg/m   EXAM: Deferred  EKG: Deferred  ASSESSMENT: Admission for possible sepsis with elevated troponin and normal echo findings (05/2020) NSTEMI -mild nonobstructive coronary disease with a large left system and diminutive right coronary artery by cath (11/2018) Fatigue, DOE, hypotension -abnormal cardiac CT with mild to moderate coronary disease and possibly occluded right coronary artery with distal collaterals (08/2018) 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   Alexandria Wallace did have some improvement in her lipids however LDL remains higher than expected for PCSK9 inhibitor.  This could indicate a high LP(a).  I recommend we check that today.  In addition she has never had genetic testing although had a phenotypic classification suggestive of FH or type IIa in 2015 with LDL 213.  She was interested in genetic testing and we will send that off today.  This may help [place] better understand why her suboptimal response is noted to the  PCSK9 inhibitor.  We may need to consider additional options.  Follow-up with me in 3 months.  Pixie Casino, MD, [facility], [place] Director of the Advanced Lipid Disorders &  Cardiovascular Risk Reduction Clinic Diplomate of the American Board of Clinical Lipidology Attending Cardiologist  Direct Dial: [phone]  Fax: [phone]  Website:  [email or web address] 07/09/2021, 9:05 AM

## 2021-07-10 LAB — LIPOPROTEIN A (LPA): Lipoprotein (a): 328.2 nmol/L — ABNORMAL HIGH (ref <75.0)

## 2021-07-21 ENCOUNTER — Telehealth: Payer: Self-pay | Admitting: Internal Medicine

## 2021-07-21 ENCOUNTER — Encounter: Payer: Self-pay | Admitting: Internal Medicine

## 2021-07-21 NOTE — Telephone Encounter (Signed)
PA for repatha submitted via CMM (Key: [id number])

## 2021-07-21 NOTE — Telephone Encounter (Signed)
PA Case: [id number], Status: Approved, Coverage Starts on: 11/30/2020 12:00:00 AM, Coverage Ends on: 11/29/2021 12:00:00 AM

## 2021-07-23 ENCOUNTER — Other Ambulatory Visit: Payer: Self-pay

## 2021-07-23 DIAGNOSIS — Z006 Encounter for examination for normal comparison and control in clinical research program: Secondary | ICD-10-CM

## 2021-07-23 NOTE — Research (Signed)
[employer] [id number] Site # [id number] Subject ID #[id number]         ELIGIBILITY CRITERIA WORKSHEET INCLUSION CRITERIA   Subject has provided informed consent prior to the initiation of any study specific activities/procedures [x]   Age 78 to 28 years [x]   MI (presumed type 1) OR [x]   PCI (with high-risk features) with at least 1 of the following: []   Age >70 []   Diabetes mellitus  HbA1c: []   History of ischemic stroke []   History of peripheral arterial disease []   Residual stenosis ? 50% []   Multivessel PCI (ie, ? 2 vessels, including branch arteries []   EXCLUSIONS THE FOLLOWING N/A [x]   Subjects known to be currently receiving investigational drug in a clinical study that is anticipated to last > 1 year []   Known Lp(a) value <104m/dL or < 200nmol/L []   Subject has a diagnosis of end-stage renal disease or requires dialysis. []   Poorly controlled (glycated hemoglobin [HbA1c] > 10%) diabetes mellitus (type 1 or type 2) []   Subject is receiving or has received lipoprotein apheresis to reduce Lp(a) within 3 months prior to enrollment. []   Known uncontrolled or recurrent ventricular tachycardia in the past 3 months prior to enrollment. []   Known malignancy (except non-melanoma skin cancers, cervical in situ carcinoma, breast ductal carcinoma in situ, or stage 1 prostate carcinoma) within the last 5 years prior to enrollment. []   Known history or evidence of clinically significant disease (eg, respiratory, gastrointestinal, or psychiatric disease) or unstable disorder or biomarker that, in the opinion of the investigator(s), would result in life expectancy < 5 years. []   Known hemorrhagic stroke. []        [employer] Lp(a) Informed Consent   Subject Name: MKLA BILY Subject met inclusion and exclusion criteria.  The informed consent form, study requirements and expectations were reviewed with the subject and questions and concerns were addressed prior to the signing of the consent form.  The  subject verbalized understanding of the trial requirements.  The subject agreed to participate in the [facility] and signed the informed consent at 1415 on 07/23/21.  The informed consent was obtained prior to performance of any protocol-specific procedures for the subject.  A copy of the signed informed consent was given to the subject and a copy was placed in the subject's medical record.   SChanda Busing [employer] Consent Version 2 Protocol Version 2    Subject #[id number] Lp(a) [id number] # [id number] SEX []    Female                      [x]    Female  Ethnicity []   Hispanic or Latino   [x]   Not Hispanic or Latino  Race [x]   White                 []   Black or African American  []   Asian []   American [place]or [place]            []   Native Hawaiian or Other Pacific Islander                      []   Other  Other   Age 80 Subject Group []    Local Lab           [x]   Historical Lp(a) value  Results328.2  Future research [x]    Yes                    []   No

## 2021-07-29 ENCOUNTER — Telehealth: Payer: Self-pay | Admitting: Internal Medicine

## 2021-07-29 NOTE — Telephone Encounter (Signed)
Alexandria Wallace from [facility] calling to offer a peer to peer. She says she will fax it over and wanted to inform the office to look out for it.

## 2021-07-29 NOTE — Telephone Encounter (Signed)
Alexandria Wallace is calling back to inform our office Alexandria Wallace does not have the coverage for the test so the peer to peer is no longer an option. Also states Dr. Debara Pickett will be receiving a letter in the mail in regards to this.

## 2021-07-31 NOTE — Telephone Encounter (Signed)
MD has peer to peer form for genetic test

## 2021-08-06 NOTE — Telephone Encounter (Signed)
Called [employer] - was unable to reach a provider, went to voicemail (08/06/2021 - 10 am)  Dr Lemmie Evens

## 2021-08-12 DIAGNOSIS — M810 Age-related osteoporosis without current pathological fracture: Secondary | ICD-10-CM | POA: Diagnosis not present

## 2021-08-12 DIAGNOSIS — N183 Chronic kidney disease, stage 3 unspecified: Secondary | ICD-10-CM | POA: Diagnosis not present

## 2021-08-12 DIAGNOSIS — R031 Nonspecific low blood-pressure reading: Secondary | ICD-10-CM | POA: Diagnosis not present

## 2021-08-12 DIAGNOSIS — I2510 Atherosclerotic heart disease of native coronary artery without angina pectoris: Secondary | ICD-10-CM | POA: Diagnosis not present

## 2021-08-12 DIAGNOSIS — E559 Vitamin D deficiency, unspecified: Secondary | ICD-10-CM | POA: Diagnosis not present

## 2021-08-12 DIAGNOSIS — E7849 Other hyperlipidemia: Secondary | ICD-10-CM | POA: Diagnosis not present

## 2021-08-12 DIAGNOSIS — G4733 Obstructive sleep apnea (adult) (pediatric): Secondary | ICD-10-CM | POA: Diagnosis not present

## 2021-08-12 DIAGNOSIS — F39 Unspecified mood [affective] disorder: Secondary | ICD-10-CM | POA: Diagnosis not present

## 2021-08-12 DIAGNOSIS — R7989 Other specified abnormal findings of blood chemistry: Secondary | ICD-10-CM | POA: Diagnosis not present

## 2021-08-18 DIAGNOSIS — E559 Vitamin D deficiency, unspecified: Secondary | ICD-10-CM | POA: Diagnosis not present

## 2021-08-18 DIAGNOSIS — F39 Unspecified mood [affective] disorder: Secondary | ICD-10-CM | POA: Diagnosis not present

## 2021-08-18 DIAGNOSIS — R413 Other amnesia: Secondary | ICD-10-CM | POA: Diagnosis not present

## 2021-08-18 DIAGNOSIS — R7989 Other specified abnormal findings of blood chemistry: Secondary | ICD-10-CM | POA: Diagnosis not present

## 2021-08-18 DIAGNOSIS — M810 Age-related osteoporosis without current pathological fracture: Secondary | ICD-10-CM | POA: Diagnosis not present

## 2021-08-18 DIAGNOSIS — Z23 Encounter for immunization: Secondary | ICD-10-CM | POA: Diagnosis not present

## 2021-08-18 DIAGNOSIS — I2510 Atherosclerotic heart disease of native coronary artery without angina pectoris: Secondary | ICD-10-CM | POA: Diagnosis not present

## 2021-08-18 DIAGNOSIS — E7849 Other hyperlipidemia: Secondary | ICD-10-CM | POA: Diagnosis not present

## 2021-09-23 DIAGNOSIS — I2510 Atherosclerotic heart disease of native coronary artery without angina pectoris: Secondary | ICD-10-CM | POA: Diagnosis not present

## 2021-09-23 DIAGNOSIS — E7849 Other hyperlipidemia: Secondary | ICD-10-CM | POA: Diagnosis not present

## 2021-09-24 LAB — NMR, LIPOPROFILE
Cholesterol, Total: 297 mg/dL — ABNORMAL HIGH (ref 100–199)
HDL Particle Number: 32.7 umol/L (ref >=30.5)
HDL-C: 90 mg/dL (ref >39)
LDL Particle Number: 2005 nmol/L — ABNORMAL HIGH (ref <1000)
LDL Size: 21.8 nm (ref >20.5)
LDL-C (NIH Calc): 196 mg/dL — ABNORMAL HIGH (ref 0–99)
LP-IR Score: <25 (ref <=45)
Small LDL Particle Number: <90 nmol/L (ref <=527)
Triglycerides: 73 mg/dL (ref 0–149)

## 2021-09-30 ENCOUNTER — Encounter: Payer: Self-pay | Admitting: Internal Medicine

## 2021-09-30 ENCOUNTER — Ambulatory Visit: Payer: Medicare PPO | Admitting: Internal Medicine

## 2021-09-30 ENCOUNTER — Other Ambulatory Visit: Payer: Self-pay

## 2021-09-30 VITALS — BP 110/70 | HR 81 | Ht 64.0 in | Wt 143.6 lb

## 2021-09-30 DIAGNOSIS — E7849 Other hyperlipidemia: Secondary | ICD-10-CM

## 2021-09-30 DIAGNOSIS — I2510 Atherosclerotic heart disease of native coronary artery without angina pectoris: Secondary | ICD-10-CM

## 2021-09-30 DIAGNOSIS — Z006 Encounter for examination for normal comparison and control in clinical research program: Secondary | ICD-10-CM

## 2021-09-30 DIAGNOSIS — M791 Myalgia, unspecified site: Secondary | ICD-10-CM | POA: Diagnosis not present

## 2021-09-30 DIAGNOSIS — T466X5D Adverse effect of antihyperlipidemic and antiarteriosclerotic drugs, subsequent encounter: Secondary | ICD-10-CM

## 2021-09-30 NOTE — Patient Instructions (Signed)
Medication Instructions:    The nurse from Lipid clinic will call you to discuss Leqvio.  *If you need a refill on your cardiac medications before your next appointment, please call your pharmacy*   Lab Work: None today  If you have labs (blood work) drawn today and your tests are completely normal, you will receive your results only by: [place] (if you have MyChart) OR A paper copy in the mail If you have any lab test that is abnormal or we need to change your treatment, we will call you to review the results.   Testing/Procedures: None today    Follow-Up: At [facility], you and your health needs are our priority.  As part of our continuing mission to provide you with exceptional heart care, we have created designated Provider Care Teams.  These Care Teams include your primary Cardiologist (physician) and Advanced Practice Providers (APPs -  Physician Assistants and Nurse Practitioners) who all work together to provide you with the care you need, when you need it.  We recommend signing up for the patient portal called "MyChart".  Sign up information is provided on this After Visit Summary.  MyChart is used to connect with patients for Virtual Visits (Telemedicine).  Patients are able to view lab/test results, encounter notes, upcoming appointments, etc.  Non-urgent messages can be sent to your provider as well.   To learn more about what you can do with MyChart, go to [email or web address].    Your next appointment:   6 month(s)  The format for your next appointment:   In Person  Provider:   Dr.Hilty   Other Instructions None

## 2021-09-30 NOTE — Progress Notes (Signed)
OFFICE NOTE  Chief Complaint:  Follow-up testing  Primary Care Physician: Cari Caraway, MD  HPI:  Alexandria Wallace is a 80 y.o. female who I previously saw in 2012 for evaluation of heart murmur. She underwent an echocardiogram at that time which showed an EF of greater than 62%, mild diastolic dysfunction, mild to moderate TR with an RVSP of 40 mmHg, and mild aortic sclerosis without stenosis. She also underwent a stress test which showed no reversible ischemia. She denies any chest pain or worsening shortness of breath. Recently she saw her primary care provider who noted a heart murmur which was not previously appreciated. She was referred for reevaluation. Ms. Alexandria Wallace reports doing well. She is quite active physically. Although she takes medications for mood disorder and has some mildly elevated cholesterol, she has no other cardiac issues that were aware of. Cholesterol is treated by her primary care provider and she is on zetia. Her mother died at age 23 of an MI but there is no other coronary disease in the family.  09/01/2018  Alexandria Wallace returns today for follow-up.  I have only seen her twice in the last 7 years.  Initially in 2012 for evaluation of heart murmur.  Echo showed some TR and mild aortic sclerosis.  I last saw her in 2017 and a repeat echo in 2018 showed mild aortic sclerosis with normal systolic function and no significant valvular abnormalities.  She mentioned that she had a history of elevated cholesterol in the past but was hesitant to take statins.  She denied any history of heart disease in her family and noted that her mother died at age 74 and did have an MRI.  Her father actually died in his 43s from cancer and is unclear whether he had any coronary disease.  I did receive lab work from her PCP at [facility], Dr. Addison Lank that indicated a severe dyslipidemia.  In fact in June 2019 her total cholesterol was 321, triglycerides 144, HDL 56 and LDL-C of 236.  Non-HDL  cholesterol 265.  This is in the setting of ezetimibe.  When I press her more on this she says that actually she was given the diagnosis of a genetic dyslipidemia when she spent some time in [place].  Her daughter apparently is an Systems developer and also practices herbal medications and has convinced her that statin therapy is potentially harmful.  Ms. Zarzycki was on Crestor for short period of time and apparently tolerated it with an appropriate decrease in her cholesterol.  These findings are highly suggestive of FH and certainly raise my concern that her symptoms may be due to significant coronary artery disease.  She had last had stress testing back in 2012 which was negative for ischemia.  Currently she gets short of breath when doing moderate to significant exertion, such as walking upstairs or doing water aerobics.  She also notes an associated drop in blood pressure with these episodes.  She denies specific chest pain or pressure.  10/04/2018  Alexandria Wallace returns today for follow-up of her CT angiogram.  As noted above she has a significant history of dyslipidemia with likely FH and has recently had shortness of breath with moderate to significant exertion.  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  There is evidence of collaterals from the distal LAD.  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  The LAD is noted to be a large vessel that wraps around the apex.  A PFO is suspected with left-to-right shunt.  Overall the findings could explain her symptoms however currently she is free of angina.  Her dyspnea is fairly stable and tolerated.  She is started taking Crestor but does feel that she has some side effects including cramping which is been intermittent.  She would like to  lower her dose.  She understands that she is way above her goal LDL less than 70 and will likely need additional medication.  Because of the abnormal CT findings, we discussed possible heart catheterization for definitive evaluation of her symptoms.  Although the right coronary could be occluded this appears to be a small, nondominant vessel and intervention would not likely be beneficial.  11/10/2018  Alexandria Wallace is seen today in follow-up.  This is an unscheduled visit for chest pain.  Recently as described above we had performed a CT coronary angiogram which did show either anomalous right coronary artery or possibly obstruction with collaterals that seem to come from the distal LAD.  It was unclear whether there was significant obstruction.  At that time she had been having fatigue and shortness of breath.  Now she reports her shortness of breath is worsened and she developed chest pain.  This is described as sharp and lasted for several minutes across her back and shoulders, while exerting and exercising.  Apparently she was doing some lap pull downs suggesting this could be musculoskeletal, but she has had some other episodes with exertion which were concerning.  Overall she feels unwell and thinks that her symptoms are progressively worsening.  Her EKG however is normal today.  12/16/2018  Alexandria Wallace is seen today in follow-up.  She underwent left heart catheterization for persistent chest discomfort, fatigue and shortness of breath.  Her CT coronary angiogram suggested possible diminutive right coronary artery versus ostial obstruction.  Fortunately the cath showed a diminutive right coronary with a large left circulation and minimal, nonobstructive coronary disease with normal LV function.  She had significant dyslipidemia but has been on both ezetimibe and rosuvastatin 40 mg.  She recently had a repeat lipid profile on December 08, 2018.  Total cholesterol is 146, triglycerides 98, HDL 52 and LDL  74.  This represents a marked improvement and is very near goal LDL less than 70.  06/12/2020  Ms. Daudelin returns today for follow-up of recent hospitalization.  She was seen in consultation on July 9 by Dr. Martinique for elevated troponin.  She was initially admitted for hypotension and elevated lactate concerning for sepsis.  There was some vomiting but not protracted.  Ultimately cultures were negative and the source of her illness was not apparent.  She had complained of headache however CSF studies were negative for meningitis.  There was significant increase in troponin to 1500 however the echo was normal with normal wall motion.  There was some mild diffuse ST depression suggesting this may have been subendocardial ischemia.  Based on her recent cath in 2019 there was minimal nonobstructive coronary disease.  It was not felt that this was a true non-STEMI.  On follow-up today she denies any chest pain or worsening shortness of breath.  She reports recent auto diuresis of fluids that she was given during her hospitalization.  She is not on a diuretic.  She has stopped her statins and had previously had good LDL at 74  but prior to that in the past has had an LDL well over 200 apparently in [place].  She has been working with an integrative medicine physician there.  She stopped the statins because of significant myalgias.  Given the high LDL cholesterol, begs the question is whether she may have a familial hyperlipidemia however given her minimal coronary disease and her age I think that is unlikely.  02/17/2021  Ms. Scheib is seen today in follow-up.  Overall she says she feels fairly stable.  She denies any chest pain or shortness of breath.  Fortunately she has had no further hospitalizations for sepsis.  Blood pressures well controlled today.  Her weight is normal.  EKG shows normal sinus rhythm.  She just saw her PCP who did lab work and her cholesterol remains high.  Total is 281, HDL 70, LDL 201  and triglycerides 65.  She has been intolerant to statins in the past having caused myalgias and concerns about memory loss.  She says she will not take them.  We try to discuss other options today for a possible familial hyperlipidemia.  Her goal per current guidelines is greater than 50% reduction in LDL.  She was noted to have coronary artery disease on her last heart catheterization.  I think she is a good candidate for a PCSK9 inhibitor.  07/09/2021  Ms. Rill returns today for follow-up.  She has been on Repatha since March.  She says she is tolerating well without side effects however her cholesterol is not significantly lower.  Is now with total cholesterol 244, triglycerides 73, HDL 71 and LDL 161.  Her LDL had been in 201 but this represents less than 65% reduction which is typical of the medication.  There may be some reasons for that including a high LP(a) which we have not tested for.  As previously mentioned she had LDL cholesterol over 200 in [place].  I was able to bring up some paperwork from 2015 which showed total cholesterol 302, triglycerides 153, HDL 58 and LDL 213.  She had lipoprotein phenotypic classification of 2 a which showed increased beta particles and normal prebeta and alpha particles with no chylomicrons.  This is more consistent with familial hyperlipidemia but this is not a genetic test.  09/30/2021  Ms. Tollett returns today for follow-up of her lipid and genetic testing.  Unfortunately been having some issues with her legs and has discontinued her Repatha.  Cholesterol has gone back up with LDL now 196.  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  Would seem that that is primarily driving her high LDL cholesterol.  Based on that I had recommended a  referral to our clinical trial of olpaseran , which is targeted therapy for LP(a) lowering.  I suspect she will be a good candidate for this.  That being said, she will need some older alternative for lipid-lowering since she cannot take statins or Repatha.   PMHx:  Past Medical History:  Diagnosis Date   High cholesterol    Mood disorder [facility])     Past Surgical History:  Procedure Laterality Date   COLECTOMY     EUS N/A 11/29/2015   Procedure: LOWER ENDOSCOPIC ULTRASOUND (EUS);  Surgeon: Arta Silence, MD;  Location: [facility] ENDOSCOPY;  Service: Endoscopy;  Laterality: N/A;  FLEXIBLE SIGMOIDOSCOPY N/A 01/27/2018   Procedure: FLEXIBLE SIGMOIDOSCOPY;  Surgeon: Otis Brace, MD;  Location: [place];  Service: Gastroenterology;  Laterality: N/A;   LEFT HEART CATH AND CORONARY ANGIOGRAPHY N/A 11/15/2018   Procedure: LEFT HEART CATH AND CORONARY ANGIOGRAPHY;  Surgeon: Troy Sine, MD;  Location: [place] CV LAB;  Service: Cardiovascular;  Laterality: N/A;   POLYPECTOMY  02/19/2016    FAMHx:  Family History  Problem Relation Age of Onset   Heart attack Mother    Cancer Father    Cancer Sister 41   Other Brother 2       meningitis     SOCHx:   reports that she has never smoked. She has never used smokeless tobacco. She reports current alcohol use of about 2.0 standard drinks per week. She reports that she does not use drugs.  ALLERGIES:  Allergies  Allergen Reactions   Statins Other (See Comments)    Myalgias   Chocolate     Stated by patient from blood work    ROS: Pertinent items noted in HPI and remainder of comprehensive ROS otherwise negative.  HOME MEDS: Current Outpatient Medications on File Prior to Visit  Medication Sig Dispense Refill   acetaminophen (TYLENOL) 500 MG tablet Take 500 mg by mouth every 6 (six) hours as needed (for pain.).     Ascorbic Acid (VITAMIN C) 1000 MG tablet Take 1,000 mg by mouth every evening.     busPIRone (BUSPAR) 10 MG  tablet Take 10 mg by mouth daily.      Cholecalciferol (VITAMIN D3) 75 MCG (3000 UT) TABS Take 3,000 Units by mouth daily.     Coenzyme Q10 (CO Q10) 100 MG CAPS Take 100 mg by mouth every evening.      Evolocumab (REPATHA SURECLICK) 625 MG/ML SOAJ Inject 1 Dose into the skin every 14 (fourteen) days. 2 mL 11   FLUoxetine (PROZAC) 10 MG capsule Take 10 mg by mouth daily.     ibandronate (BONIVA) 150 MG tablet 1 tablet     Methylfol-Algae-B12-Acetylcyst (CEREFOLIN NAC) 6-90.314-2-600 MG TABS Take 1 tablet by mouth 2 (two) times daily.     nitroGLYCERIN (NITROSTAT) 0.4 MG SL tablet 1 tab at onset of chest pain, may repeat once after 5 minutes     Omega-3 Fatty Acids (FISH OIL) 1000 MG CAPS Take 1,000 mg by mouth at bedtime.     ondansetron (ZOFRAN ODT) 4 MG disintegrating tablet Take 1 tablet (4 mg total) by mouth every 8 (eight) hours as needed for nausea or vomiting. 20 tablet 0   polyethylene glycol (MIRALAX) 17 g packet Take 17 g by mouth daily as needed. 14 each 0   TURMERIC PO Take 448 mg by mouth every evening.     vitamin B-12 (CYANOCOBALAMIN) 1000 MCG tablet Take 1,000 mcg by mouth daily. (Patient not taking: No sig reported)     No current facility-administered medications on file prior to visit.    LABS/IMAGING: No results found for this or any previous visit (from the past 48 hour(s)). No results found.  WEIGHTS: Wt Readings from Last 3 Encounters:  09/30/21 143 lb 9.6 oz (65.1 kg)  07/09/21 141 lb (64 kg)  02/17/21 140 lb 9.6 oz (63.8 kg)    VITALS: BP 110/70   Pulse 81   Ht 5\' 4"  (1.626 m)   Wt 143 lb 9.6 oz (65.1 kg)   SpO2 99%   BMI 24.65 kg/m   EXAM: Deferred  EKG: Deferred  ASSESSMENT:  Admission for possible sepsis with elevated troponin and normal echo findings (05/2020) NSTEMI -mild nonobstructive coronary disease with a large left system and diminutive right coronary artery by cath (11/2018) Fatigue, DOE, hypotension -abnormal cardiac CT with mild to  moderate coronary disease and possibly occluded right coronary artery with distal collaterals (08/2018) 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   Mrs. Haseman unfortunately says she cannot tolerate Repatha as she had some leg pains and aching with it that seem to go away after stopping it.  This is highly unusual however she will not take the medicine.  She has significant elevation in her cholesterol now with LDL back to 196 off of treatments.  We discussed the only other reasonable alternative for her which would be Leqvio.  This may be a challenge to get regarding cost although she does have Medicare.  We will go ahead and try to process this medication.  In addition I would recommend she follow-up with our LP(a) lowering trial as I believe she is a good candidate for that and if she is randomized to therapy may derive significant benefit.  Plan follow-up with me in 6 months or sooner as necessary.  Pixie Casino, MD, [facility], [place] Director of the Advanced Lipid Disorders &  Cardiovascular Risk Reduction Clinic Diplomate of the American Board of Clinical Lipidology Attending Cardiologist  Direct Dial: [phone]  Fax: [phone]  Website:  [email or web address] 09/30/2021, 11:27 AM

## 2021-10-29 DIAGNOSIS — H40013 Open angle with borderline findings, low risk, bilateral: Secondary | ICD-10-CM | POA: Diagnosis not present

## 2021-11-26 DIAGNOSIS — R7989 Other specified abnormal findings of blood chemistry: Secondary | ICD-10-CM | POA: Diagnosis not present

## 2021-12-31 DIAGNOSIS — F310 Bipolar disorder, current episode hypomanic: Secondary | ICD-10-CM | POA: Diagnosis not present

## 2022-01-23 ENCOUNTER — Encounter: Payer: Self-pay | Admitting: Neurology

## 2022-01-23 DIAGNOSIS — R269 Unspecified abnormalities of gait and mobility: Secondary | ICD-10-CM | POA: Diagnosis not present

## 2022-01-23 DIAGNOSIS — W19XXXA Unspecified fall, initial encounter: Secondary | ICD-10-CM | POA: Diagnosis not present

## 2022-02-02 NOTE — Progress Notes (Signed)
? ? ?Assessment/Plan:  ? ?1.  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   ? 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  Discussed this today. ? -We discussed the diagnosis as well as pathophysiology of the disease.  We discussed treatment options as well as prognostic indicators.  Patient education was provided. ? -Greater than 50% of the 60 minute visit was spent in counseling answering questions and talking about what to expect now as well as in the future.  We talked about medication options as well as potential future surgical options.  We talked about safety in the home. ? -We decided to add carbidopa/levodopa 25/100.  1/2 tab tid x 1 wk, then 1/2 in am & noon & 1 at night for a week, then 1/2 in am &1 at noon &night for a week, then 1 po tid.  Risks, benefits, side effects and alternative therapies were discussed.  The opportunity to ask questions was given and they were answered to the best of my ability.  The patient expressed understanding and willingness to follow the outlined treatment protocols. ? -I will refer the patient to the Parkinson's program at the [facility], for PT/OT .  We talked about the importance of safe, cardiovascular exercise in Parkinson's disease. ? -We discussed community resources in the area including patient support groups and community exercise programs for PD and pt education was provided to the patient.  She met with my LCSW today. ? -We will do MRI of the brain, primarily due to significant hyperreflexia with hung up reflexes. ? ? ?Subjective:  ? ?Alexandria Wallace was seen today in the movement disorders clinic for neurologic consultation at the request of Cari Caraway, MD.  The consultation is for the evaluation of falls, gait instability and concerns for possible Parkinson's disease. Daughter supplements hx.   Pt wonders if her sciatica contributes to balance.  Medical records made available to me  are reviewed. ? ? ?Specific Symptoms:  ?Tremor: Yes.  , L hand x 2 weeks, only in bed ?Family hx of similar:  Yes.  , just paternal cousin with Parkinsons Disease  ?Voice: weak and hoarse ?Sleep: pt states that trouble sleeping 1-2 times per week ? Vivid Dreams:  No. ? Acting out dreams:  No. ?Wet Pillows: Yes.   ?Postural symptoms:  Yes.  , x 1-1.5 years ? Falls?  Yes.   - just one - 01/17/22 - fell backward and hit head on edge of piano.  No other falls ?Bradykinesia symptoms: shuffling gait, slow movements, and difficulty getting out of a chair ?Loss of smell:  Yes.   X years ?Loss of taste:  Yes.   ?Urinary Incontinence:  No. ?Difficulty Swallowing:  No. ?Handwriting, micrographia: Yes.   ?Trouble with ADL's:  No. ? Trouble buttoning clothing: No. ?Depression:  No., but has some anxiety.  Is on some antidepressants by [facility].   ?Memory changes:  Yes.  , "slow to respond"; trouble with names; able to do finances and remember meds ?Hallucinations:  No. ? visual distortions: No., does have occ floaters ?N/V:  No. ?Lightheaded:  No. ? Syncope: No. ?Diplopia:  No. ?Dyskinesia:  No. ?Prior exposure to reglan/antipsychotics: Yes.  , had zyprexa last in 11/2016 and d/c due to tongue pain.  Doesn't sound like she was on others.  Hx of on lithium. ? ?Last CT brain was July, 2021 and was unremarkable. ? ?PREVIOUS MEDICATIONS: none to date ? ?ALLERGIES:   ?  Allergies  ?Allergen Reactions  ? Statins Other (See Comments)  ?  Myalgias  ? Chocolate   ?  Stated by patient from blood work  ? ? ?CURRENT MEDICATIONS:  ?Current Meds  ?Medication Sig  ? acetaminophen (TYLENOL) 500 MG tablet Take 500 mg by mouth every 6 (six) hours as needed (for pain.).  ? Ascorbic Acid (VITAMIN C) 1000 MG tablet Take 1,000 mg by mouth every evening.  ? busPIRone (BUSPAR) 10 MG tablet Take 10 mg by mouth daily.   ? Cholecalciferol (VITAMIN D3) 75 MCG (3000 UT) TABS Take 3,000 Units by mouth daily.  ? Coenzyme Q10 (CO Q10) 100 MG CAPS Take 100 mg  by mouth every evening.   ? FLUoxetine (PROZAC) 10 MG capsule Take 10 mg by mouth daily.  ? ibandronate (BONIVA) 150 MG tablet 1 tablet  ? nitroGLYCERIN (NITROSTAT) 0.4 MG SL tablet 1 tab at onset of chest pain, may repeat once after 5 minutes  ? Omega-3 Fatty Acids (FISH OIL) 1000 MG CAPS Take 1,000 mg by mouth at bedtime.  ? TURMERIC PO Take 448 mg by mouth every evening.  ?  ? ?Objective:  ? ?VITALS:   ?Vitals:  ? 02/04/22 1021  ?BP: 125/71  ?Pulse: 77  ?SpO2: 98%  ?Weight: 147 lb 12.8 oz (67 kg)  ?Height: 5' 4"  (1.626 m)  ? ? ?GEN:  The patient appears stated age and is in NAD. ?HEENT:  Normocephalic, atraumatic.  The mucous membranes are moist. The superficial temporal arteries are without ropiness or tenderness. ?CV:  RRR with 3/6 sem ?Lungs:  CTAB ?Neck/HEME:  There are no carotid bruits bilaterally. ? ?Neurological examination: ? ?Orientation: The patient is alert and oriented x3.  ?Cranial nerves: There is good facial symmetry. There is facial hypomimia.  Extraocular muscles are intact. No square wave jerks)The visual fields are full to confrontational testing. The speech is fluent and clear. Soft palate rises symmetrically and there is no tongue deviation. Hearing is intact to conversational tone. ?Sensation: Sensation is intact to light and pinprick throughout (facial, trunk, extremities). Vibration is intact at the bilateral big ankle (slight decreased distally). There is no extinction with double simultaneous stimulation. There is no sensory dermatomal level identified. ?Motor: Strength is 5/5 in the bilateral upper and lower extremities.   Shoulder shrug is equal and symmetric.  There is no pronator drift. ?Deep tendon reflexes: Deep tendon reflexes are 3/4 at the bilateral biceps, triceps, brachioradialis, patella and achilles.  Reflexes at the right patella get "hung up."   Plantar responses are downgoing bilaterally. ? ?Movement examination: ?Tone: There is mild to mod increased tone in the  bilateral UE, L>R ?Abnormal movements: none even with distraction ?Coordination:  There is  decremation with RAM's, with any form of RAMS, including alternating supination and pronation of the forearm, hand opening and closing, finger taps, heel taps and toe taps on the L>R ?Gait and Station: The patient has no difficulty arising out of a deep-seated chair without the use of the hands. The patient's stride length is decreased and she drags with L leg.  Almost no arm swing bilaterally.  ?I have reviewed and interpreted the following labs independently ?  Chemistry   ?   ?Component Value Date/Time  ? NA 140 06/08/2020 0842  ? NA 142 11/10/2018 0952  ? K 4.0 06/08/2020 0842  ? CL 109 06/08/2020 0842  ? CO2 24 06/08/2020 0842  ? BUN 9 06/08/2020 0842  ? BUN 14 11/10/2018 0952  ?  CREATININE 0.88 06/08/2020 0842  ?    ?Component Value Date/Time  ? CALCIUM 8.8 (L) 06/08/2020 0842  ? ALKPHOS 51 06/05/2020 1422  ? AST 46 (H) 06/05/2020 1422  ? ALT 38 06/05/2020 1422  ? BILITOT 0.5 06/05/2020 1422  ?  ? ? ?No results found for: TSH ?Lab Results  ?Component Value Date  ? WBC 4.8 06/08/2020  ? HGB 10.4 (L) 06/08/2020  ? HCT 31.5 (L) 06/08/2020  ? MCV 101.3 (H) 06/08/2020  ? PLT 165 06/08/2020  ? ? ? ?Total time spent on today's visit was 60 minutes, including both face-to-face time and nonface-to-face time.  Time included that spent on review of records (prior notes available to me/labs/imaging if pertinent), discussing treatment and goals, answering patient's questions and coordinating care. ? ?Cc:  Cari Caraway, MD ? ?

## 2022-02-04 ENCOUNTER — Other Ambulatory Visit: Payer: Self-pay

## 2022-02-04 ENCOUNTER — Ambulatory Visit: Payer: Medicare PPO | Admitting: Neurology

## 2022-02-04 ENCOUNTER — Encounter: Payer: Self-pay | Admitting: Neurology

## 2022-02-04 VITALS — BP 125/71 | HR 77 | Ht 64.0 in | Wt 147.8 lb

## 2022-02-04 DIAGNOSIS — G20 Parkinson's disease: Secondary | ICD-10-CM | POA: Diagnosis not present

## 2022-02-04 DIAGNOSIS — G20A1 Parkinson's disease without dyskinesia, without mention of fluctuations: Secondary | ICD-10-CM | POA: Insufficient documentation

## 2022-02-04 NOTE — Patient Instructions (Addendum)
Start Carbidopa Levodopa as follows: ?Take 1/2 tablet three times daily, at least 30 minutes before meals (approximately 9am/noon/4pm), for one week ?Then take 1/2 tablet in the morning, 1/2 tablet in the afternoon, 1 tablet in the evening, at least 30 minutes before meals, for one week ?Then take 1/2 tablet in the morning, 1 tablet in the afternoon, 1 tablet in the evening, at least 30 minutes before meals, for one week ?Then take 1 tablet three times daily at 9am/noon/4pm), at least 30 minutes before meals ?  As a reminder, carbidopa/levodopa can be taken at the same time as a carbohydrate, but we like to have you take your pill either 30 minutes before a protein source or 1 hour after as protein can interfere with carbidopa/levodopa absorption. ? ?A referral to [facility] has been placed for your MRI someone will contact you directly to schedule your appt. They are located at [place]. Please contact them directly by calling [phone] with any questions regarding your referral. ? ? ?

## 2022-02-06 ENCOUNTER — Telehealth: Payer: Self-pay

## 2022-02-06 ENCOUNTER — Other Ambulatory Visit: Payer: Self-pay

## 2022-02-06 DIAGNOSIS — G20 Parkinson's disease: Secondary | ICD-10-CM

## 2022-02-06 MED ORDER — CARBIDOPA-LEVODOPA 25-100 MG PO TABS: 1.0000 | ORAL_TABLET | Freq: Three times a day (TID) | ORAL | 1 refills | Status: DC

## 2022-02-06 NOTE — Telephone Encounter (Signed)
New message    Office visit on  3.8.2023  1. Which medications need to be refilled? (please list name of each medication and dose if known)Carbidopa Levodopa  2. Which pharmacy/location (including street and city if local pharmacy) is medication to be sent to [place], [place] - [place] [address]  3. Do they need a 30 day or 90 day supply? 30 days supply

## 2022-02-06 NOTE — Telephone Encounter (Signed)
Called patient and spoke to her about sending in the prescription for her and to make sure she goes by the titration schedule on her AVS patient agreed and had no further questions  ?

## 2022-02-16 ENCOUNTER — Ambulatory Visit: Payer: Medicare PPO | Admitting: Occupational Therapy

## 2022-02-16 ENCOUNTER — Encounter: Payer: Self-pay | Admitting: Occupational Therapy

## 2022-02-16 ENCOUNTER — Ambulatory Visit: Payer: Medicare PPO | Attending: Neurology | Admitting: Rehabilitative and Restorative Service Providers"

## 2022-02-16 ENCOUNTER — Encounter: Payer: Self-pay | Admitting: Rehabilitative and Restorative Service Providers"

## 2022-02-16 ENCOUNTER — Other Ambulatory Visit: Payer: Self-pay

## 2022-02-16 DIAGNOSIS — R2681 Unsteadiness on feet: Secondary | ICD-10-CM

## 2022-02-16 DIAGNOSIS — R293 Abnormal posture: Secondary | ICD-10-CM

## 2022-02-16 DIAGNOSIS — R7989 Other specified abnormal findings of blood chemistry: Secondary | ICD-10-CM | POA: Diagnosis not present

## 2022-02-16 DIAGNOSIS — M6281 Muscle weakness (generalized): Secondary | ICD-10-CM | POA: Insufficient documentation

## 2022-02-16 DIAGNOSIS — R278 Other lack of coordination: Secondary | ICD-10-CM | POA: Insufficient documentation

## 2022-02-16 DIAGNOSIS — R269 Unspecified abnormalities of gait and mobility: Secondary | ICD-10-CM | POA: Diagnosis not present

## 2022-02-16 DIAGNOSIS — R29818 Other symptoms and signs involving the nervous system: Secondary | ICD-10-CM

## 2022-02-16 DIAGNOSIS — G20 Parkinson's disease: Secondary | ICD-10-CM | POA: Diagnosis not present

## 2022-02-16 DIAGNOSIS — M810 Age-related osteoporosis without current pathological fracture: Secondary | ICD-10-CM | POA: Diagnosis not present

## 2022-02-16 DIAGNOSIS — R2689 Other abnormalities of gait and mobility: Secondary | ICD-10-CM | POA: Insufficient documentation

## 2022-02-16 DIAGNOSIS — R946 Abnormal results of thyroid function studies: Secondary | ICD-10-CM | POA: Diagnosis not present

## 2022-02-16 NOTE — Therapy (Signed)
[facility] ?[address], [address] ?[place], [place], [address] ?Phone: [phone]   Fax:  [phone] ? ?Occupational Therapy Evaluation ? ?Patient Details  ?Name: Alexandria Wallace ?MRN: [id number] ?Date of Birth: 23-Oct-1941 ?Referring Provider (OT): Tat, Eustace Quail, DO ? ? ?Encounter Date: 02/16/2022 ? ? OT End of Session - 02/16/22 1256   ? ? Visit Number 1   ? Number of Visits 9   ? Date for OT Re-Evaluation 04/17/22   ? Authorization Type [employer] Medicare   ? OT Start Time 1150   ? OT Stop Time 1232   ? OT Time Calculation (min) 42 min   ? Activity Tolerance Patient tolerated treatment well   ? Behavior During Therapy [facility] for tasks assessed/performed   ? ?  ?  ? ?  ? ? ?Past Medical History:  ?Diagnosis Date  ? Colon polyp   ? High cholesterol   ? Mood disorder ([place])   ? ? ?Past Surgical History:  ?Procedure Laterality Date  ? COLECTOMY    ? EUS N/A 11/29/2015  ? Procedure: LOWER ENDOSCOPIC ULTRASOUND (EUS);  Surgeon: Arta Silence, MD;  Location: [facility] ENDOSCOPY;  Service: Endoscopy;  Laterality: N/A;  ? FLEXIBLE SIGMOIDOSCOPY N/A 01/27/2018  ? Procedure: FLEXIBLE SIGMOIDOSCOPY;  Surgeon: Otis Brace, MD;  Location: [facility] ENDOSCOPY;  Service: Gastroenterology;  Laterality: N/A;  ? LEFT HEART CATH AND CORONARY ANGIOGRAPHY N/A 11/15/2018  ? Procedure: LEFT HEART CATH AND CORONARY ANGIOGRAPHY;  Surgeon: Troy Sine, MD;  Location: [place] CV LAB;  Service: Cardiovascular;  Laterality: N/A;  ? POLYPECTOMY  02/19/2016  ? ? ?There were no vitals filed for this visit. ? ? Subjective Assessment - 02/16/22 1152   ? ? Subjective  Pt reports noticing a progression in decrease in her mobility with shuffling gait, slower movements impacting her dressing, and occasional difficulty getting out of a chair.  Pt notes improvements in her mobility and her handwriting since starting PD medication.   ? Pertinent History high cholesterol, mood disorder   ? Patient Stated Goals To walk  better and get stronger   ? Currently in Pain? Yes   ? Pain Score 3    ? Pain Location Hip   sciatica  ? Pain Orientation Right   ? Pain Descriptors / Indicators Discomfort   ? Pain Onset More than a month ago   ? Pain Frequency Intermittent   ? Aggravating Factors  can come and go   ? Pain Relieving Factors accupuncture helps   ? ?  ?  ? ?  ? ? ? ? [place] OT Assessment - 02/16/22 1154   ? ?  ? Assessment  ? Medical Diagnosis Parkinson's disease   ? Referring Provider (OT) Tat, Eustace Quail, DO   ? Onset Date/Surgical Date 02/04/22   ? Hand Dominance Right   ? Prior Therapy prior PT (not for PD)   ?  ? Precautions  ? Precautions Fall   ?  ? Restrictions  ? Weight Bearing Restrictions No   ?  ? Balance Screen  ? Has the patient fallen in the past 6 months Yes   ? How many times? 1   ? Has the patient had a decrease in activity level because of a fear of falling?  No   feels more active since starting medication ~1 week ago  ? Is the patient reluctant to leave their home because of a fear of falling?  No   ?  ?  Home  Environment  ? Family/patient expects to be discharged to: Private residence   ? Living Arrangements Alone   ? Available Help at Discharge Available PRN/intermittently   ? Type of Home Other (Comment)   condo  ? Home Access Stairs   14 rail on Left when ascending  ? Home Layout One level   ? Bathroom Shower/Tub Tub/Shower unit;Curtain   ? Bathroom Toilet Standard   ? Lives With Alone   ?  ? Prior Function  ? Level of Independence Independent   ? Leisure Patient began the [employer] riding a bike, treadmill, and chair yoga   ?  ? ADL  ? Eating/Feeding Independent   ? Grooming Independent   ? Upper Body Dressing Increased time   ? Lower Body Dressing Increased time   ? Toilet Transfer Modified independent   occasional difficulty standing up from variety of surfaces  ? Toileting - Clothing Manipulation Independent   ? Tub/Shower Transfer Independent   ?  ? IADL  ? Shopping Takes care of all shopping needs independently    ? Light Housekeeping Does personal laundry completely;Maintains house alone or with occasional assistance   ? Meal Prep Plans, prepares and serves adequate meals independently   ? Community Mobility Drives own vehicle   ? Medication Management Is responsible for taking medication in correct dosages at correct time   ? Financial Management Manages financial matters independently (budgets, writes checks, pays rent, bills goes to bank), collects and keeps track of income   "maybe slower"  ?  ? Written Expression  ? Dominant Hand Right   ? Handwriting Increased time;100% legible;Mild micrographia   reports handwriting was smaller and not as clear before beginning the PD medication  ?  ? Vision - History  ? Baseline Vision Bifocals   ?  ? Observation/Other Assessments  ? Other Surveys  Select   ? Physical Performance Test   Yes   ? Simulated Eating Time (seconds) 10.5   ? Donning Doffing Jacket Time (seconds) 10.4   ? Donning Doffing Jacket Comments 3 button/unbutton: 24.5   ?  ? Sensation  ? Light Touch Appears Intact   ? Additional Comments Notes some h/o radiating pain in R LE with chronic, intermittent sciatica   ?  ? Coordination  ? Gross Motor Movements are Fluid and Coordinated Yes   ? 9 Hole Peg Test Right;Left   ? Right 9 Hole Peg Test 29.22   ? Left 9 Hole Peg Test 33.59   ? Box and Blocks R: 48 and L: 46   ? Tremors tremors in L hand ~ 4 weeks, only when in bed   ?  ? ROM / Strength  ? AROM / PROM / Strength AROM;Strength   ?  ? AROM  ? Overall AROM  Deficits   ? AROM Assessment Site Shoulder   ? Right/Left Shoulder Right;Left   ? Right Shoulder Flexion 150 Degrees   ? Left Shoulder Flexion 140 Degrees   ?  ? Strength  ? Overall Strength Deficits   ? Overall Strength Comments R shoulder flexion/abduction 5/5, L shoulder flexion/abduction 4/5   ?  ? Hand Function  ? Right Hand Grip (lbs) 42   ? Right Hand Lateral Pinch 13 lbs   ? Right Hand 3 Point Pinch 12 lbs   ? Left Hand Grip (lbs) 40   ? Left Hand  Lateral Pinch 15 lbs   ? Left 3 point pinch 13 lbs   ? ?  ?  ? ?  ? ? ? ? ? ? ? ? ? ? ? ? ? ? ? ? ? ? ?  OT Education - 02/16/22 1254   ? ? Education Details Educated on role and purpose of OT, as well as potential interventions and goals for therapy based on initial evaluation findings.  Educated on typical intervention strategies and routine follow up with therapies with PD diagnosis.   ? Person(s) Educated Patient   ? Methods Explanation   ? Comprehension Verbalized understanding   ? ?  ?  ? ?  ? ? ? OT Short Term Goals - 02/16/22 1301   ? ?  ? OT SHORT TERM GOAL #1  ? Title Pt will be independent with PD specific HEP   ? Time 4   ? Period Weeks   ? Status New   ? Target Date 03/20/22   ?  ? OT SHORT TERM GOAL #2  ? Title Pt will verbalize understanding of adapted strategies to maximize safety and I with ADLs/ IADLs.   ? Time 4   ? Period Weeks   ? Status New   ?  ? OT SHORT TERM GOAL #3  ? Title Pt will demonstrate improved UE functional use for ADLs as evidenced by increasing box/ blocks score by 2 blocks with LUE.   ? Baseline R: 48 and L: 46 (pt bumping into partition 25% of time on L)   ? Time 4   ? Period Weeks   ? Status New   ? ?  ?  ? ?  ? ? ? ? OT Long Term Goals - 02/16/22 1304   ? ?  ? OT LONG TERM GOAL #1  ? Title Pt will write a short paragraph with 100% legibility and no significant decrease in letter size   ? Time 8   ? Period Weeks   ? Status New   ? Target Date 04/17/22   ?  ? OT LONG TERM GOAL #2  ? Title Pt will demonstrate improved ease with fastening buttons as evidenced by decreasing 3 button/ unbutton time to 21 seconds or less.   ? Baseline 24.5   ? Time 8   ? Period Weeks   ? Status New   ?  ? OT LONG TERM GOAL #3  ? Title Pt will demonstrate ability to retrieve a lightweight object at 150 shoulder flexion with LUE   ? Baseline R: 150, L: 140   ? Time 8   ? Period Weeks   ? Status New   ?  ? OT LONG TERM GOAL #4  ? Title Pt will demonstrate understanding of memory compensations and ways  to keep thinking skills sharp   ? Time 8   ? Period Weeks   ? Status New   ?  ? OT LONG TERM GOAL #5  ? Title Pt will verbalize understanding of ways to prevent future PD related complications and PD commun

## 2022-02-16 NOTE — Therapy (Signed)
[facility] ?[facility] ?[address], [address] ?[place], [place], [address] ?Phone: [phone]   Fax:  [phone] ? ?Physical Therapy Evaluation ? ?Patient Details  ?Name: Alexandria Wallace ?MRN: [id number] ?Date of Birth: 26-Nov-1941 ?Referring Provider (PT): Alonza Bogus, DO ? ? ?Encounter Date: 02/16/2022 ? ? PT End of Session - 02/16/22 1309   ? ? Visit Number 1   ? Number of Visits 16   ? Date for PT Re-Evaluation 04/17/22   ? Authorization Type [employer] Medicare   ? PT Start Time 1102   ? PT Stop Time 1145   ? PT Time Calculation (min) 43 min   ? Equipment Utilized During Treatment Gait belt   ? Activity Tolerance Patient tolerated treatment well   ? Behavior During Therapy [facility] for tasks assessed/performed   ? ?  ?  ? ?  ? ? ?Past Medical History:  ?Diagnosis Date  ? Colon polyp   ? High cholesterol   ? Mood disorder ([place])   ? ? ?Past Surgical History:  ?Procedure Laterality Date  ? COLECTOMY    ? EUS N/A 11/29/2015  ? Procedure: LOWER ENDOSCOPIC ULTRASOUND (EUS);  Surgeon: Arta Silence, MD;  Location[facility] ENDOSCOPY;  Service: Endoscopy;  Laterality: N/A;  ? FLEXIBLE SIGMOIDOSCOPY N/A 01/27/2018  ? Procedure: FLEXIBLE SIGMOIDOSCOPY;  Surgeon: Otis Brace, MD;  Location: [facility] ENDOSCOPY;  Service: Gastroenterology;  Laterality: N/A;  ? LEFT HEART CATH AND CORONARY ANGIOGRAPHY N/A 11/15/2018  ? Procedure: LEFT HEART CATH AND CORONARY ANGIOGRAPHY;  Surgeon: Troy Sine, MD;  Location: [place] [facility] CV LAB;  Service: Cardiovascular;  Laterality: N/A;  ? POLYPECTOMY  02/19/2016  ? ? ?There were no vitals filed for this visit. ? ? ? Subjective Assessment - 02/16/22 1104   ? ? Subjective The patient reports she sought care for changes in gait due to her left leg remaining stiff during swing phase.  She saw Dr. Carles Collet and began medication last week.  She notes a significant improvement with her walking already reporting her left leg bends now.  She reports more careful on the stairs and notes  one fall while stepping backwards.   ? Pertinent History hypercholesterolemia   ? Patient Stated Goals "To walk perfect"   ? Currently in Pain? Yes   ? Pain Score 0-No pain   ? Pain Location Hip   ? Pain Orientation Right   ? Pain Descriptors / Indicators Discomfort   ? Pain Onset More than a month ago   ? Pain Frequency Intermittent   ? Aggravating Factors  can come and go; standing in one place can aggravate it   ? Pain Relieving Factors accupuncture helps   ? ?  ?  ? ?  ? ? ? ? ? [facility] PT Assessment - 02/16/22 1109   ? ?  ? Assessment  ? Medical Diagnosis Parkinson's Disease   ? Referring Provider (PT) Alonza Bogus, DO   ? Onset Date/Surgical Date 02/04/22   ? Hand Dominance Right   ? Prior Therapy none for PD   ?  ? Precautions  ? Precautions Fall   ?  ? Restrictions  ? Weight Bearing Restrictions No   ?  ? Balance Screen  ? Has the patient fallen in the past 6 months Yes   ? How many times? 1--stepping backwards   ? Has the patient had a decrease in activity level because of a fear of falling?  No   ? Is the patient  reluctant to leave their home because of a fear of falling?  No   ?  ? Home Environment  ? Living Environment Private residence   ? Living Arrangements Alone   ? Type of Home House   ? Home Access Stairs to enter   ? Entrance Stairs-Number of Steps 14   ? Entrance Stairs-Rails Right   ? Home Layout One level   ? Home Equipment None   ?  ? Prior Function  ? Level of Independence Independent   ? Leisure Patient began the [employer] riding a bike, treadmill, and chair yoga   ?  ? Observation/Other Assessments  ? Focus on Therapeutic Outcomes (FOTO)  n/a   ?  ? Sensation  ? Light Touch Appears Intact   ? Additional Comments Notes some h/o radiating pain in R LE with chronic, intermittent sciatica   ?  ? Coordination  ? Gross Motor Movements are Fluid and Coordinated --   L UE for RAM does not have same full ROM  ? Finger Nose Finger Test equal bilat   ?  ? Functional Tests  ? Functional tests Other   ?  ?  Other:  ? Other/ Comments Notes handwriting was very small and she can tell a difference after starting the medicine.   ?  ? ROM / Strength  ? AROM / PROM / Strength AROM;Strength   ?  ? AROM  ? Overall AROM  Within functional limits for tasks performed   ?  ? Strength  ? Overall Strength Deficits   ? Overall Strength Comments R shoulder flexion/abduction 5/5, L shoulder flexion/abduction 4/5   ? Strength Assessment Site Hip;Knee;Ankle   ? Right/Left Hip Right;Left   ? Right Hip Flexion 4/5   ? Left Hip Flexion 4/5   ? Right/Left Knee Right;Left   ? Right Knee Flexion 5/5   ? Right Knee Extension 5/5   ? Left Knee Flexion 5/5   ? Left Knee Extension 5/5   ? Right/Left Ankle Right;Left   ? Right Ankle Dorsiflexion 4/5   ? Left Ankle Dorsiflexion 5/5   ?  ? Flexibility  ? Soft Tissue Assessment /Muscle Length yes   ? Hamstrings tightness in the R hip and HS from chronic sciatica   ?  ? Ambulation/Gait  ? Ambulation/Gait Yes   ? Ambulation/Gait Assistance 7: Independent   ? Ambulation Distance (Feet) 200 Feet   ? Assistive device None   ? Gait Pattern Step-through pattern;Decreased arm swing - right;Decreased arm swing - left;Decreased step length - right;Decreased step length - left;Decreased stride length;Right foot flat;Left foot flat;Shuffle   ? Gait velocity 2.33 ft/sec   ? Stairs Yes   ? Stairs Assistance 6: Modified independent (Device/Increase time)   ? Stair Management Technique Step to pattern   ? Number of Stairs 4   ?  ? Standardized Balance Assessment  ? Standardized Balance Assessment Mini-BESTest;Timed Up and Go Test   ?  ? Mini-BESTest  ? Sit To Stand Normal: Comes to stand without use of hands and stabilizes independently.   ? Rise to Toes Normal: Stable for 3 s with maximum height.   ? Stand on one leg (left) Moderate: < 20 s   4 seconds  ? Stand on one leg (right) Normal: 20 s.   ? Stand on one leg - lowest score 1   ? Compensatory Stepping Correction - Forward Normal: Recovers independently with a  single, large step (second realignement is allowed).   ?  Compensatory Stepping Correction - Backward Moderate: More than one step is required to recover equilibrium   ? Compensatory Stepping Correction - Left Lateral Severe: Falls, or cannot step   ? Compensatory Stepping Correction - Right Lateral Normal: Recovers independently with 1 step (crossover or lateral OK)   ? Stepping Corredtion Lateral - lowest score 0   ? Stance - Feet together, eyes open, firm surface  Normal: 30s   ? Stance - Feet together, eyes closed, foam surface  Moderate: < 30s   ? Incline - Eyes Closed Normal: Stands independently 30s and aligns with gravity   ? Change in Gait Speed Moderate: Unable to change walking speed or signs of imbalance   ? Walk with head turns - Horizontal Moderate: performs head turns with reduction in gait speed.   ? Walk with pivot turns Moderate:Turns with feet close SLOW (>4 steps) with good balance.   ? Step over obstacles Moderate: Steps over box but touches box OR displays cautious behavior by slowing gait.   ? Timed UP & GO with Dual Task Moderate: Dual Task affects either counting OR walking (>10%) when compared to the TUG without Dual Task.   ? Mini-BEST total score 18   ?  ? Timed Up and Go Test  ? Normal TUG (seconds) 12.65   ? Cognitive TUG (seconds) 16.3   ? TUG Comments >10% slower speed with cognitive / dual task TUG   ? ?  ?  ? ?  ? ? ? ? ? ? ? ? ? ? ? ? ? ?Objective measurements completed on examination: See above findings.  ? ? ? ? ? [facility] Adult PT Treatment/Exercise - 02/16/22 1109   ? ?  ? Exercises  ? Exercises Other Exercises   ? Other Exercises  lateral stepping for large amplitude motion, PWR! up for sit<>stand working on weight shift and postural upright   ? ?  ?  ? ?  ? ? ? ? ? ? ? ? ? ? PT Education - 02/16/22 1308   ? ? Education Details HEP lateral stepping<>midline and power up sit<>stand   ? Person(s) Educated Patient   ? Methods Explanation;Demonstration;Handout   ? Comprehension  Verbalized understanding;Returned demonstration   ? ?  ?  ? ?  ? ? ? PT Short Term Goals - 02/16/22 1310   ? ?  ? PT SHORT TERM GOAL #1  ? Title The patient will be indep with initial HEP.   ? Time 4   ? Period W

## 2022-02-16 NOTE — Patient Instructions (Signed)
Access Code: [email or web address] ?URL: [email or web address] ?Date: 02/16/2022 ?Prepared by: Rudell Cobb ? ?Exercises ?Step Sideways with Arms Reaching - 2 x daily - 7 x weekly - 1 sets - 10 reps ?Sit to Stand with Arm Swing - 2 x daily - 7 x weekly - 1 sets - 10 reps ? ?

## 2022-02-19 DIAGNOSIS — M810 Age-related osteoporosis without current pathological fracture: Secondary | ICD-10-CM | POA: Diagnosis not present

## 2022-02-19 DIAGNOSIS — E559 Vitamin D deficiency, unspecified: Secondary | ICD-10-CM | POA: Diagnosis not present

## 2022-02-19 DIAGNOSIS — Z1389 Encounter for screening for other disorder: Secondary | ICD-10-CM | POA: Diagnosis not present

## 2022-02-19 DIAGNOSIS — I2510 Atherosclerotic heart disease of native coronary artery without angina pectoris: Secondary | ICD-10-CM | POA: Diagnosis not present

## 2022-02-19 DIAGNOSIS — E7849 Other hyperlipidemia: Secondary | ICD-10-CM | POA: Diagnosis not present

## 2022-02-19 DIAGNOSIS — Z23 Encounter for immunization: Secondary | ICD-10-CM | POA: Diagnosis not present

## 2022-02-19 DIAGNOSIS — Z0000 Encounter for general adult medical examination without abnormal findings: Secondary | ICD-10-CM | POA: Diagnosis not present

## 2022-02-19 DIAGNOSIS — F39 Unspecified mood [affective] disorder: Secondary | ICD-10-CM | POA: Diagnosis not present

## 2022-02-19 DIAGNOSIS — G20 Parkinson's disease: Secondary | ICD-10-CM | POA: Diagnosis not present

## 2022-02-20 ENCOUNTER — Telehealth: Payer: Self-pay | Admitting: Neurology

## 2022-02-20 ENCOUNTER — Other Ambulatory Visit: Payer: Self-pay | Admitting: Family Medicine

## 2022-02-20 DIAGNOSIS — M810 Age-related osteoporosis without current pathological fracture: Secondary | ICD-10-CM

## 2022-02-20 NOTE — Telephone Encounter (Signed)
Called patient she said  she has not been eating she thought she couldn't eat anything with her medication so I went over the fact to not eat protein for about half hour before and after the medication. Patients lowest Bp was 88/44. She is going to try to eat with medication and drink lots of water to see if that helps  ?

## 2022-02-20 NOTE — Telephone Encounter (Signed)
Patient called and stated that she began taking the full pill of the carbidopa-levodopa last Saturday.  She stated that the last 3 nights after taking it she has been zonked out and her blood pressure has gone done.  She says the med makes her feel a lot better other thank that. ?

## 2022-02-23 ENCOUNTER — Ambulatory Visit: Payer: Medicare PPO | Admitting: Rehabilitative and Restorative Service Providers"

## 2022-02-23 ENCOUNTER — Encounter: Payer: Self-pay | Admitting: Occupational Therapy

## 2022-02-23 ENCOUNTER — Ambulatory Visit: Payer: Medicare PPO | Admitting: Occupational Therapy

## 2022-02-23 ENCOUNTER — Other Ambulatory Visit: Payer: Self-pay

## 2022-02-23 VITALS — BP 105/65 | HR 80

## 2022-02-23 DIAGNOSIS — R2681 Unsteadiness on feet: Secondary | ICD-10-CM

## 2022-02-23 DIAGNOSIS — M6281 Muscle weakness (generalized): Secondary | ICD-10-CM

## 2022-02-23 DIAGNOSIS — R293 Abnormal posture: Secondary | ICD-10-CM

## 2022-02-23 DIAGNOSIS — R2689 Other abnormalities of gait and mobility: Secondary | ICD-10-CM | POA: Diagnosis not present

## 2022-02-23 DIAGNOSIS — R29818 Other symptoms and signs involving the nervous system: Secondary | ICD-10-CM | POA: Diagnosis not present

## 2022-02-23 DIAGNOSIS — R278 Other lack of coordination: Secondary | ICD-10-CM

## 2022-02-23 DIAGNOSIS — G20 Parkinson's disease: Secondary | ICD-10-CM | POA: Diagnosis not present

## 2022-02-23 NOTE — Patient Instructions (Addendum)
Coordination Exercises ? ?Perform the following exercises for 10-15 minutes 1 times per day. Perform with both hand(s). Perform using big movements. ? ?Rotate ball with fingertips: Pick up with fingers/thumb and move as much as you can with each turn/movement (clockwise and counter-clockwise). ?Toss ball in the air and catch with the same hand: Work towards tossing big/high (opening hand big) ?Toss ball from one hand to the other: Work towards tossing big/high (opening hand big) ? ?Flipping Cards: Place deck of cards on the table. Flip cards over by opening your hand big to grasp and then turn your palm up big. ? ?Pick up various small objects that are different shapes/sizes and place in container. (paperclips, buttons, keys, dried beans/pasta, coins, screws, nuts/bolts, washers, board game pieces, etc.) ?Pick up coins and stack one at a time: Pick up with big, intentional movements. Do not drag coin to the edge. (5-10 in a stack) ?Pick up 5-10 coins one at a time and hold in palm. Then, move coins from palm to fingertips one at time and place in coin bank/container. ? ?Perform "Flicks"/hand stretches: Close hands then flick out your fingers with focus on opening hands, pulling wrists back, and extending elbows like you are pushing. ?

## 2022-02-23 NOTE — Therapy (Signed)
[place] [facility] ?[address], [address] ?[place], [place], [address] ?Phone: [phone]   Fax:  [phone] ? ?Physical Therapy Treatment ? ?Patient Details  ?Name: Alexandria Wallace ?MRN: [id number] ?Date of Birth: [birth year] ?Referring Provider (PT): Alonza Bogus, DO ? ? ?Encounter Date: 02/23/2022 ? ? PT End of Session - 02/23/22 1125   ? ? Visit Number 2   ? Number of Visits 16   ? Date for PT Re-Evaluation 04/17/22   ? Authorization Type [employer] Medicare   ? PT Start Time 1102   ? PT Stop Time 2778   ? PT Time Calculation (min) 40 min   ? Equipment Utilized During Treatment Gait belt   ? Activity Tolerance Patient tolerated treatment well   ? Behavior During Therapy [facility] for tasks assessed/performed   ? ?  ?  ? ?  ? ? ?Past Medical History:  ?Diagnosis Date  ? Colon polyp   ? High cholesterol   ? Mood disorder ([place])   ? ? ?Past Surgical History:  ?Procedure Laterality Date  ? COLECTOMY    ? EUS N/A 11/29/2015  ? Procedure: LOWER ENDOSCOPIC ULTRASOUND (EUS);  Surgeon: Arta Silence, MD;  Location: [facility] ENDOSCOPY;  Service: Endoscopy;  Laterality: N/A;  ? FLEXIBLE SIGMOIDOSCOPY N/A 01/27/2018  ? Procedure: FLEXIBLE SIGMOIDOSCOPY;  Surgeon: Otis Brace, MD;  Location: [facility] ENDOSCOPY;  Service: Gastroenterology;  Laterality: N/A;  ? LEFT HEART CATH AND CORONARY ANGIOGRAPHY N/A 11/15/2018  ? Procedure: LEFT HEART CATH AND CORONARY ANGIOGRAPHY;  Surgeon: Troy Sine, MD;  Location: [facility] [facility] CV LAB;  Service: Cardiovascular;  Laterality: N/A;  ? POLYPECTOMY  02/19/2016  ? ? ?There were no vitals filed for this visit. ? ? Subjective Assessment - 02/23/22 1104   ? ? Subjective The patient tried HEP and notes the L LE didn't want to come back to middle at first.   ? Pertinent History hypercholesterolemia   ? Patient Stated Goals "To walk perfect"   ? Currently in Pain? No/denies   ? ?  ?  ? ?  ? ? ? ? ? [facility] PT Assessment - 02/23/22 1118   ? ?  ? Assessment  ? Medical Diagnosis  Parkinson's disease   ? Referring Provider (PT) Alonza Bogus, DO   ? Onset Date/Surgical Date 02/04/22   ? ?  ?  ? ?  ? ? ? ? ? ? ? ? ? ? ? ? ? ? ? ? [place] Adult PT Treatment/Exercise - 02/23/22 1118   ? ?  ? Ambulation/Gait  ? Ambulation/Gait Yes   ? Ambulation/Gait Assistance 7: Independent   ? Ambulation Distance (Feet) 200 Feet   ? Assistive device None   ? Gait Comments larger amplitude marching exagerrating arm swing with contralateral UE, backwards walking encouraging step through pattern.   ?  ? Self-Care  ? Self-Care Other Self-Care Comments   ? Other Self-Care Comments  discussed post d/c plan of PWR classes in the community.  PT will continue to discuss ongoing exercise for post d/c   ?  ? Neuro Re-ed   ? Neuro Re-ed Details  Large amplitude movements for sit to stand with arm swing, lateral reaching, side stepping R and L sides, large forward step<>return to midline, backwards walking, large marching, weight shift.  Tall kneeling to quadriped and thread the needle x 5 reps each side.  Compliant surface standing on foam with backwards stepping R and L x 10 reps each, eyes closed x  30 seconds, foam with horiz/vertical head motion.   ? ?  ?  ? ?  ? ? ? ? ? ? ? ? ? ? PT Education - 02/23/22 1125   ? ? Education Details HEP   ? Person(s) Educated Patient   ? Methods Explanation;Demonstration;Handout   ? Comprehension Verbalized understanding;Returned demonstration   ? ?  ?  ? ?  ? ? ? PT Short Term Goals - 02/16/22 1310   ? ?  ? PT SHORT TERM GOAL #1  ? Title The patient will be indep with initial HEP.   ? Time 4   ? Period Weeks   ? Target Date 03/18/22   ?  ? PT SHORT TERM GOAL #2  ? Title The patient will improve TUG cognitive to < or equal to 13 seconds to demo improved multi-tasking and mobility.   ? Time 4   ? Period Weeks   ? Target Date 03/18/22   ?  ? PT SHORT TERM GOAL #3  ? Title The patient will improve gait speed from 2.33 ft/sec to > or equal to 2.8 ft/sec demonstrating improved functional  mobility.   ? Time 4   ? Period Weeks   ? Target Date 03/18/22   ?  ? PT SHORT TERM GOAL #4  ? Title The patient will verbalize understanding of support group and community class offerings for [employer]!.   ? Time 4   ? Period Weeks   ? Target Date 03/18/22   ? ?  ?  ? ?  ? ? ? ? PT Long Term Goals - 02/16/22 1314   ? ?  ? PT LONG TERM GOAL #1  ? Title The patient will be indep with HEP progression.   ? Time 8   ? Period Weeks   ? Target Date 04/17/22   ?  ? PT LONG TERM GOAL #2  ? Title Improve mini BEST test from 18/28 to > or equal to 22/28.   ? Time 8   ? Period Weeks   ? Target Date 04/17/22   ?  ? PT LONG TERM GOAL #3  ? Title The patient will demonstrate more normalized gait pattern with reciprocal arm swing and L knee flexion with gait.   ? Time 8   ? Period Weeks   ? Target Date 04/17/22   ?  ? PT LONG TERM GOAL #4  ? Title The patient will negotiate steps with reciprocal pattern and one HR.   ? Time 8   ? Period Weeks   ? Target Date 04/17/22   ? ?  ?  ? ?  ? ? ? ? ? ? ? ? Plan - 02/23/22 1148   ? ? Clinical Impression Statement The patient tolerated increased activity and progression of large amplitude movements today.  She is very motivated to participate in HEP, and community exercise routine.  PT to continue to progress balance, dynamic gait activities.   ? PT Frequency 2x / week   ? PT Duration 8 weeks   ? PT Treatment/Interventions ADLs/Self Care Home Management;Patient/family education;Gait training;Stair training;Functional mobility training;Therapeutic activities;Therapeutic exercise;Neuromuscular re-education;Balance training;Manual techniques   ? PT Next Visit Plan continue progressing large amplitude/PWR! standing activities near support surface for home, backwards walking, balance strategies, UE reciprocal movement during gait   ? Consulted and Agree with Plan of Care Patient   ? ?  ?  ? ?  ? ? ?Patient will benefit from skilled therapeutic intervention in order  to improve the following deficits and  impairments:  Abnormal gait, Difficulty walking, Decreased strength, Postural dysfunction, Decreased balance ? ?Visit Diagnosis: ?Muscle weakness (generalized) ? ?Abnormal posture ? ?Unsteadiness on feet ? ?Other abnormalities of gait and mobility ? ? ? ? ?Problem List ?Patient Active Problem List  ? Diagnosis Date Noted  ? Parkinson's disease ([place]) 02/04/2022  ? Hypotension   ? Elevated troponin   ? Nausea   ? Nausea and vomiting   ? Septic shock ([place]) 06/05/2020  ? AKI (acute kidney injury) ([place])   ? Metabolic acidosis, increased anion gap   ? Dyspnea   ? Agatston coronary artery calcium score between 100 and 199   ? Murmur 11/11/2016  ? 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 ? ?[place], [place] ?02/23/2022, 12:52 PM ? ?[place] [facility] ?[address], [address] ?[place], [place], [address] ?Phone: [phone]   Fax:  [phone] ? ?Name: ESMAE DONATHAN ?MRN: [id number] ?Date of Birth: 11-28-1941 ? ? ? ?

## 2022-02-23 NOTE — Patient Instructions (Signed)
Access Code: [email or web address] ?URL: [email or web address] ?Date: 02/23/2022 ?Prepared by: Rudell Cobb ? ?Exercises ?- Sit to Stand with Arm Swing  - 2 x daily - 7 x weekly - 1 sets - 10 reps ?- Step Sideways with Arms Reaching  - 2 x daily - 7 x weekly - 1 sets - 10 reps ?- Side to Side Weight Shift with Overhead Reach and Counter Support  - 1-2 x daily - 7 x weekly - 2 sets - 10 reps ?- Standing Reach to Opposite Side with Weight Shift  - 1-2 x daily - 7 x weekly - 2 sets - 10 reps ?- Step Forward with Arms Reaching to Sides  - 2 x daily - 7 x weekly - 1 sets - 10 reps ?

## 2022-02-23 NOTE — Therapy (Signed)
[place] [facility] ?[address], [address] ?[place], [place], [address] ?Phone: [phone]   Fax:  [phone] ? ?Occupational Therapy Treatment ? ?Patient Details  ?Name: Alexandria Wallace ?MRN: [id number] ?Date of Birth: 10-23-1941 ?Referring Provider (OT): Tat, Eustace Quail, DO ? ? ?Encounter Date: 02/23/2022 ? ? OT End of Session - 02/23/22 1022   ? ? Visit Number 2   ? Number of Visits 9   ? Date for OT Re-Evaluation 04/17/22   ? Authorization Type [employer] Medicare   ? OT Start Time 1020   ? OT Stop Time 1100   ? OT Time Calculation (min) 40 min   ? Activity Tolerance Patient tolerated treatment well   ? Behavior During Therapy [facility] for tasks assessed/performed   ? ?  ?  ? ?  ? ? ?Past Medical History:  ?Diagnosis Date  ? Colon polyp   ? High cholesterol   ? Mood disorder ([place])   ? ? ?Past Surgical History:  ?Procedure Laterality Date  ? COLECTOMY    ? EUS N/A 11/29/2015  ? Procedure: LOWER ENDOSCOPIC ULTRASOUND (EUS);  Surgeon: Arta Silence, MD;  Location: [facility] ENDOSCOPY;  Service: Endoscopy;  Laterality: N/A;  ? FLEXIBLE SIGMOIDOSCOPY N/A 01/27/2018  ? Procedure: FLEXIBLE SIGMOIDOSCOPY;  Surgeon: Otis Brace, MD;  Location: [facility] ENDOSCOPY;  Service: Gastroenterology;  Laterality: N/A;  ? LEFT HEART CATH AND CORONARY ANGIOGRAPHY N/A 11/15/2018  ? Procedure: LEFT HEART CATH AND CORONARY ANGIOGRAPHY;  Surgeon: Troy Sine, MD;  Location: [facility] CV LAB;  Service: Cardiovascular;  Laterality: N/A;  ? POLYPECTOMY  02/19/2016  ? ? ?Vitals:  ? 02/23/22 1025  ?BP: 105/65  ?Pulse: 80  ? ? ? Subjective Assessment - 02/23/22 1022   ? ? Subjective  Pt reports feeling a lot better since starting the PD medications, however BP sometimes runs a little low   ? Pertinent History high cholesterol, mood disorder   ? Patient Stated Goals To walk better and get stronger   ? Currently in Pain? No/denies   ? Pain Onset More than a month ago   ? ?  ?  ? ?  ? ? ? ?Educated on big amplitude  movements and functional carryover into self-care and household tasks.  Engaged in card flipping, ball rotation, ball toss in single hand and from hand to hand, and [facility] tasks with in-hand manipulation, translation, picking up and stacking variety of small sized items.  Therapist reiterating large amplitude movements with forearm supination and opening hand and fingers during ball toss and picking up small items.  Pt dropping items ~5% of time, but overall mildly decreased coordination improved with cues for focus on big/open movements. ? ? ? ? ? ? ? ? ? ? ? ? ? ? ? ? ? ? ? ? ? OT Short Term Goals - 02/23/22 1024   ? ?  ? OT SHORT TERM GOAL #1  ? Title Pt will be independent with PD specific HEP   ? Time 4   ? Period Weeks   ? Status On-going   ? Target Date 03/20/22   ?  ? OT SHORT TERM GOAL #2  ? Title Pt will verbalize understanding of adapted strategies to maximize safety and I with ADLs/ IADLs.   ? Time 4   ? Period Weeks   ? Status On-going   ?  ? OT SHORT TERM GOAL #3  ? Title Pt will demonstrate improved UE functional use for ADLs  as evidenced by increasing box/ blocks score by 2 blocks with LUE.   ? Baseline R: 48 and L: 46 (pt bumping into partition 25% of time on L)   ? Time 4   ? Period Weeks   ? Status On-going   ? ?  ?  ? ?  ? ? ? ? OT Long Term Goals - 02/23/22 1025   ? ?  ? OT LONG TERM GOAL #1  ? Title Pt will write a short paragraph with 100% legibility and no significant decrease in letter size   ? Time 8   ? Period Weeks   ? Status On-going   ? Target Date 04/17/22   ?  ? OT LONG TERM GOAL #2  ? Title Pt will demonstrate improved ease with fastening buttons as evidenced by decreasing 3 button/ unbutton time to 21 seconds or less.   ? Baseline 24.5   ? Time 8   ? Period Weeks   ? Status On-going   ?  ? OT LONG TERM GOAL #3  ? Title Pt will demonstrate ability to retrieve a lightweight object at 150 shoulder flexion with LUE   ? Baseline R: 150, L: 140   ? Time 8   ? Period Weeks   ? Status On-going    ?  ? OT LONG TERM GOAL #4  ? Title Pt will demonstrate understanding of memory compensations and ways to keep thinking skills sharp   ? Time 8   ? Period Weeks   ? Status On-going   ?  ? OT LONG TERM GOAL #5  ? Title Pt will verbalize understanding of ways to prevent future PD related complications and PD community resources.   ? Time 8   ? Period Weeks   ? Status On-going   ? ?  ?  ? ?  ? ? ? ? ? ? ? ? Plan - 02/23/22 1022   ? ? Clinical Impression Statement Pt reports feeling so much better after starting PD medication.  Pt reports noting improvements in her walking, ability to go up/down stairs, getting out of the car easier, and noting improved mood overall.  Pt very receptive to education about big amplitude movements both with therapeutic activities and functional self-care and home making tasks.  Pt demonstrating mild tightness and decreased coordination LUE compared to R during tasks.   ? OT Occupational Profile and History Detailed Assessment- Review of Records and additional review of physical, cognitive, psychosocial history related to current functional performance   ? Occupational performance deficits (Please refer to evaluation for details): ADL's;IADL's   ? Body Structure / Function / Physical Skills ADL;Balance;Body mechanics;Coordination;Decreased knowledge of precautions;Decreased knowledge of use of DME;Dexterity;Endurance;Flexibility;FMC;GMC;IADL;Mobility;ROM;Pain;Strength;UE functional use;Tone   ? Cognitive Skills Memory   ? Rehab Potential Good   ? Clinical Decision Making Limited treatment options, no task modification necessary   ? Comorbidities Affecting Occupational Performance: May have comorbidities impacting occupational performance   ? Modification or Assistance to Complete Evaluation  No modification of tasks or assist necessary to complete eval   ? OT Frequency 1x / week   ? OT Duration 8 weeks   ? OT Treatment/Interventions Self-care/ADL training;Moist Heat;Ultrasound;Therapeutic  exercise;Energy conservation;Neuromuscular education;DME and/or AE instruction;Functional Mobility Training;Manual Therapy;Therapeutic activities;Cognitive remediation/compensation;Patient/family education;Balance training   ? Plan Educate on big amplitude movements, provide bag exercises for HEP and functional reaching tasks   ? Consulted and Agree with Plan of Care Patient   ? ?  ?  ? ?  ? ? ?  Patient will benefit from skilled therapeutic intervention in order to improve the following deficits and impairments:   ?Body Structure / Function / Physical Skills: ADL, Balance, Body mechanics, Coordination, Decreased knowledge of precautions, Decreased knowledge of use of DME, Dexterity, Endurance, Flexibility, FMC, GMC, IADL, Mobility, ROM, Pain, Strength, UE functional use, Tone ?Cognitive Skills: Memory ?  ? ? ?Visit Diagnosis: ?Muscle weakness (generalized) ? ?Other lack of coordination ? ?Abnormal posture ? ?Other symptoms and signs involving the nervous system ? ?Unsteadiness on feet ? ? ? ?Problem List ?Patient Active Problem List  ? Diagnosis Date Noted  ? Parkinson's disease ([place]) 02/04/2022  ? Hypotension   ? Elevated troponin   ? Nausea   ? Nausea and vomiting   ? Septic shock ([place]) 06/05/2020  ? AKI (acute kidney injury) ([place])   ? Metabolic acidosis, increased anion gap   ? Dyspnea   ? Agatston coronary artery calcium score between 100 and 199   ? Murmur 11/11/2016  ? 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 ? [place], [place] ?02/23/2022, 11:46 AM ? ?[place] ?[facility] ?[address] ?[place], [place], [address] ?Phone: [phone]   Fax:  [phone] ? ?Name: Alexandria Wallace ?MRN: [id number] ?Date of Birth: December 16, 1940 ? ?

## 2022-02-24 ENCOUNTER — Ambulatory Visit
Admission: RE | Admit: 2022-02-24 | Discharge: 2022-02-24 | Disposition: A | Discharge status: Home / Self Care | Payer: Medicare PPO | Source: Ambulatory Visit | Attending: Neurology | Admitting: Neurology

## 2022-02-24 DIAGNOSIS — G20 Parkinson's disease: Secondary | ICD-10-CM | POA: Diagnosis not present

## 2022-02-24 DIAGNOSIS — I611 Nontraumatic intracerebral hemorrhage in hemisphere, cortical: Secondary | ICD-10-CM | POA: Diagnosis not present

## 2022-02-24 DIAGNOSIS — G319 Degenerative disease of nervous system, unspecified: Secondary | ICD-10-CM | POA: Diagnosis not present

## 2022-03-04 ENCOUNTER — Encounter: Payer: Self-pay | Admitting: Occupational Therapy

## 2022-03-04 ENCOUNTER — Encounter: Payer: Self-pay | Admitting: Rehabilitative and Restorative Service Providers"

## 2022-03-04 ENCOUNTER — Ambulatory Visit: Payer: Medicare PPO | Attending: Neurology | Admitting: Occupational Therapy

## 2022-03-04 ENCOUNTER — Ambulatory Visit: Payer: Medicare PPO | Admitting: Rehabilitative and Restorative Service Providers"

## 2022-03-04 DIAGNOSIS — R29818 Other symptoms and signs involving the nervous system: Secondary | ICD-10-CM | POA: Insufficient documentation

## 2022-03-04 DIAGNOSIS — R2689 Other abnormalities of gait and mobility: Secondary | ICD-10-CM | POA: Insufficient documentation

## 2022-03-04 DIAGNOSIS — M6281 Muscle weakness (generalized): Secondary | ICD-10-CM | POA: Diagnosis not present

## 2022-03-04 DIAGNOSIS — R293 Abnormal posture: Secondary | ICD-10-CM | POA: Insufficient documentation

## 2022-03-04 DIAGNOSIS — R278 Other lack of coordination: Secondary | ICD-10-CM | POA: Diagnosis not present

## 2022-03-04 DIAGNOSIS — R2681 Unsteadiness on feet: Secondary | ICD-10-CM | POA: Diagnosis not present

## 2022-03-04 NOTE — Therapy (Signed)
[place] ?[facility] ?[address], [address] ?[place], [place], [address] ?Phone: [phone]   Fax:  [phone] ? ?Occupational Therapy Treatment ? ?Patient Details  ?Name: Alexandria Wallace ?MRN: [id number] ?Date of Birth: 05-21-1941 ?Referring Provider (OT): Tat, Eustace Quail, DO ? ? ?Encounter Date: 03/04/2022 ? ? OT End of Session - 03/04/22 1154   ? ? Visit Number 3   ? Number of Visits 9   ? Date for OT Re-Evaluation 04/17/22   ? Authorization Type [employer] Medicare   ? OT Start Time 1152   ? OT Stop Time 1232   ? OT Time Calculation (min) 40 min   ? Activity Tolerance Patient tolerated treatment well   ? Behavior During Therapy [facility] for tasks assessed/performed   ? ?  ?  ? ?  ? ? ?Past Medical History:  ?Diagnosis Date  ? Colon polyp   ? High cholesterol   ? Mood disorder ([place])   ? ? ?Past Surgical History:  ?Procedure Laterality Date  ? COLECTOMY    ? EUS N/A 11/29/2015  ? Procedure: LOWER ENDOSCOPIC ULTRASOUND (EUS);  Surgeon: Arta Silence, MD;  Location: [facility] ENDOSCOPY;  Service: Endoscopy;  Laterality: N/A;  ? FLEXIBLE SIGMOIDOSCOPY N/A 01/27/2018  ? Procedure: FLEXIBLE SIGMOIDOSCOPY;  Surgeon: Otis Brace, MD;  Location: [facility] ENDOSCOPY;  Service: Gastroenterology;  Laterality: N/A;  ? LEFT HEART CATH AND CORONARY ANGIOGRAPHY N/A 11/15/2018  ? Procedure: LEFT HEART CATH AND CORONARY ANGIOGRAPHY;  Surgeon: Troy Sine, MD;  Location: [place] CV LAB;  Service: Cardiovascular;  Laterality: N/A;  ? POLYPECTOMY  02/19/2016  ? ? ?There were no vitals filed for this visit. ? ? Subjective Assessment - 03/04/22 1153   ? ? Subjective  "I didn't know those exercises with PT would be so challenging for me."   ? Pertinent History high cholesterol, mood disorder   ? Patient Stated Goals To walk better and get stronger   ? Currently in Pain? No/denies   ? Pain Onset More than a month ago   ? ?  ?  ? ?  ? ? ?Therapist educated pt on large amplitude movements with focus on increased ROM  and intentionality with movements with LUE.  Pt demonstrating mild decrease in wrist extension and supination on L side.  Therapist provided education on impact of PD on amplitude and quality of movements.  Pt completed 1 set of 10 each hand opening, wrist flexion/extension, supination/pronation, and seated finger flicks with elbow extension.  Therapist educated on modifications of finger flicks in sitting and standing  with reaching forward, overhead, and to each diagonal.  Therapist then challenged pt to complete finger flicks with elbow extension incorporating stepping pattern out to side.  With increased challenge, pt demonstrating decreased amplitude of movements, benefiting from cues for elbow extension on L.  Therapist educated on functional carryover of tasks into home making tasks and being intentional with movements both during exercises as well as during household activities.  Provided written HEP for above exercises. ? ? ? ? ? ? ?Access Code: [facility] ?URL: [email or web address] ?Date: 03/04/2022 ?Prepared by: [facility] ? ?Exercises ?- Hand AROM Composite Flexion  - 1 x daily - 5 x weekly - 2 sets - 10 reps ?- Wrist Flexion Extension AROM - [place]  - 1 x daily - 5 x weekly - 2 sets - 10 reps ?- Standing Forearm Pronation and Supination AROM  - 1 x  daily - 5 x weekly - 2 sets - 10 reps ?- Thumb Opposition  - 1 x daily - 5 x weekly - 2 sets - 10 reps ?- Seated Finger Flicks with Elbow Extension  - 1 x daily - 5 x weekly - 2 sets - 10 reps ? ? ? ? ? ? ? ? ? ? ? ? ? ? ? OT Short Term Goals - 02/23/22 1024   ? ?  ? OT SHORT TERM GOAL #1  ? Title Pt will be independent with PD specific HEP   ? Time 4   ? Period Weeks   ? Status On-going   ? Target Date 03/20/22   ?  ? OT SHORT TERM GOAL #2  ? Title Pt will verbalize understanding of adapted strategies to maximize safety and I with ADLs/ IADLs.   ? Time 4   ? Period Weeks   ? Status On-going   ?  ? OT  SHORT TERM GOAL #3  ? Title Pt will demonstrate improved UE functional use for ADLs as evidenced by increasing box/ blocks score by 2 blocks with LUE.   ? Baseline R: 48 and L: 46 (pt bumping into partition 25% of time on L)   ? Time 4   ? Period Weeks   ? Status On-going   ? ?  ?  ? ?  ? ? ? ? OT Long Term Goals - 02/23/22 1025   ? ?  ? OT LONG TERM GOAL #1  ? Title Pt will write a short paragraph with 100% legibility and no significant decrease in letter size   ? Time 8   ? Period Weeks   ? Status On-going   ? Target Date 04/17/22   ?  ? OT LONG TERM GOAL #2  ? Title Pt will demonstrate improved ease with fastening buttons as evidenced by decreasing 3 button/ unbutton time to 21 seconds or less.   ? Baseline 24.5   ? Time 8   ? Period Weeks   ? Status On-going   ?  ? OT LONG TERM GOAL #3  ? Title Pt will demonstrate ability to retrieve a lightweight object at 150 shoulder flexion with LUE   ? Baseline R: 150, L: 140   ? Time 8   ? Period Weeks   ? Status On-going   ?  ? OT LONG TERM GOAL #4  ? Title Pt will demonstrate understanding of memory compensations and ways to keep thinking skills sharp   ? Time 8   ? Period Weeks   ? Status On-going   ?  ? OT LONG TERM GOAL #5  ? Title Pt will verbalize understanding of ways to prevent future PD related complications and PD community resources.   ? Time 8   ? Period Weeks   ? Status On-going   ? ?  ?  ? ?  ? ? ? ? ? ? ? ? Plan - 03/04/22 1154   ? ? Clinical Impression Statement Pt demontrates mild decrease in ROM in LUE when completing large ampitude exercises this session.  Pt receptive to education to attend to LUE and increase intentionality with movements both during exercises and functional tasks.  Pt demonstrating ability to increase amplitude and reach increased wrist extension and elbow extenion during activities when directed by therapist.  Pt will benefit from continued practice with provided HEP.   ? OT Occupational Profile and History Detailed Assessment-  Review of Records and  additional review of physical, cognitive, psychosocial history related to current functional performance   ? Occupational performance deficits (Please refer to evaluation for details): ADL's;IADL's   ? Body Structure / Function / Physical Skills ADL;Balance;Body mechanics;Coordination;Decreased knowledge of precautions;Decreased knowledge of use of DME;Dexterity;Endurance;Flexibility;FMC;GMC;IADL;Mobility;ROM;Pain;Strength;UE functional use;Tone   ? Cognitive Skills Memory   ? Rehab Potential Good   ? Clinical Decision Making Limited treatment options, no task modification necessary   ? Comorbidities Affecting Occupational Performance: May have comorbidities impacting occupational performance   ? Modification or Assistance to Complete Evaluation  No modification of tasks or assist necessary to complete eval   ? OT Frequency 1x / week   ? OT Duration 8 weeks   ? OT Treatment/Interventions Self-care/ADL training;Moist Heat;Ultrasound;Therapeutic exercise;Energy conservation;Neuromuscular education;DME and/or AE instruction;Functional Mobility Training;Manual Therapy;Therapeutic activities;Cognitive remediation/compensation;Patient/family education;Balance training   ? Plan reiterate large amplitude movements, begin education on bag exercises for self-care   ? [place]   ? Consulted and Agree with Plan of Care Patient   ? ?  ?  ? ?  ? ? ?Patient will benefit from skilled therapeutic intervention in order to improve the following deficits and impairments:   ?Body Structure / Function / Physical Skills: ADL, Balance, Body mechanics, Coordination, Decreased knowledge of precautions, Decreased knowledge of use of DME, Dexterity, Endurance, Flexibility, FMC, GMC, IADL, Mobility, ROM, Pain, Strength, UE functional use, Tone ?Cognitive Skills: Memory ?  ? ? ?Visit Diagnosis: ?Muscle weakness (generalized) ? ?Other lack of coordination ? ?Abnormal posture ? ?Other symptoms and signs  involving the nervous system ? ?Unsteadiness on feet ? ? ? ?Problem List ?Patient Active Problem List  ? Diagnosis Date Noted  ? Parkinson's disease ([place]) 02/04/2022  ? Hypotension   ? Elevated troponin   ? N

## 2022-03-04 NOTE — Patient Instructions (Signed)
Access Code: [email or web address] ?URL: [email or web address] ?Date: 03/04/2022 ?Prepared by: Rudell Cobb ? ?Exercises ?- Sit to Stand with Arm Swing  - 2 x daily - 7 x weekly - 1 sets - 10 reps ?- Step Sideways with Arms Reaching  - 2 x daily - 7 x weekly - 1 sets - 10 reps ?- Side to Side Weight Shift with Overhead Reach and Counter Support  - 1-2 x daily - 7 x weekly - 2 sets - 10 reps ?- Standing Reach to Opposite Side with Weight Shift  - 1-2 x daily - 7 x weekly - 2 sets - 10 reps ?- Step Forward with Arms Reaching to Sides  - 2 x daily - 7 x weekly - 1 sets - 10 reps ?- Staggered Stance Weight Shift with Arms Reaching  - 2 x daily - 7 x weekly - 1 sets - 10 reps ?

## 2022-03-04 NOTE — Therapy (Signed)
[place] ?[facility] ?[address], [address] ?[place], [place], [address] ?Phone: [phone]   Fax:  [phone] ? ?Physical Therapy Treatment ? ?Patient Details  ?Name: Alexandria Wallace ?MRN: [id number] ?Date of Birth: 1941/07/27 ?Referring Provider (PT): Alonza Bogus, DO ? ? ?Encounter Date: 03/04/2022 ? ? PT End of Session - 03/04/22 1109   ? ? Visit Number 3   ? Number of Visits 16   ? Date for PT Re-Evaluation 04/17/22   ? Authorization Type [employer] Medicare   ? PT Start Time 1107   ? PT Stop Time 1145   ? PT Time Calculation (min) 38 min   ? Equipment Utilized During Treatment Gait belt   ? Activity Tolerance Patient tolerated treatment well   ? Behavior During Therapy [facility] for tasks assessed/performed   ? ?  ?  ? ?  ? ? ?Past Medical History:  ?Diagnosis Date  ? Colon polyp   ? High cholesterol   ? Mood disorder ([place])   ? ? ?Past Surgical History:  ?Procedure Laterality Date  ? COLECTOMY    ? EUS N/A 11/29/2015  ? Procedure: LOWER ENDOSCOPIC ULTRASOUND (EUS);  Surgeon: Arta Silence, MD;  Location: [facility] ENDOSCOPY;  Service: Endoscopy;  Laterality: N/A;  ? FLEXIBLE SIGMOIDOSCOPY N/A 01/27/2018  ? Procedure: FLEXIBLE SIGMOIDOSCOPY;  Surgeon: Otis Brace, MD;  Location: [facility] ENDOSCOPY;  Service: Gastroenterology;  Laterality: N/A;  ? LEFT HEART CATH AND CORONARY ANGIOGRAPHY N/A 11/15/2018  ? Procedure: LEFT HEART CATH AND CORONARY ANGIOGRAPHY;  Surgeon: Troy Sine, MD;  Location: [place] CV LAB;  Service: Cardiovascular;  Laterality: N/A;  ? POLYPECTOMY  02/19/2016  ? ? ?There were no vitals filed for this visit. ? ? Subjective Assessment - 03/04/22 1106   ? ? Subjective The home exercises are going well.   ? Pertinent History hypercholesterolemia   ? Patient Stated Goals "To walk perfect"   ? Currently in Pain? No/denies   ? ?  ?  ? ?  ? ? ? ? ? ? ? ? ? ? ? ? ? ? ? ? ? ? ? ? [place] Adult PT Treatment/Exercise - 03/04/22 1137   ? ?  ? Ambulation/Gait  ? Ambulation/Gait Yes   ?  Ambulation/Gait Assistance 7: Independent   ? Gait Comments Outdoor ambulation up/down inclines working on L heel strike and exaggerated step length.  The patient needs cues for arm swing and we discussed doing intervals during regular walking routine of exaggerated stepping.   ?  ? Self-Care  ? Self-Care Other Self-Care Comments   ? Other Self-Care Comments  provided handout on [employer] PWR class and e-mail to contact for sign up   ?  ? Neuro Re-ed   ? Neuro Re-ed Details  Large amplitude movements for rocking ant/posterior with contralateral arm swing, lateral stepping with arm motion R and L x 15 feet x 2 reps each direction, posterior stepping with bow x 10 reps R and L.   Compliant surface standing with stepping multi-directional on/off of foam and then rocker board with weight shifting and steady balance.   ? ?  ?  ? ?  ? ? ? ? ? ? ? ? ? ? ? ? PT Short Term Goals - 02/16/22 1310   ? ?  ? PT SHORT TERM GOAL #1  ? Title The patient will be indep with initial HEP.   ? Time 4   ? Period Weeks   ? Target Date 03/18/22   ?  ?  PT SHORT TERM GOAL #2  ? Title The patient will improve TUG cognitive to < or equal to 13 seconds to demo improved multi-tasking and mobility.   ? Time 4   ? Period Weeks   ? Target Date 03/18/22   ?  ? PT SHORT TERM GOAL #3  ? Title The patient will improve gait speed from 2.33 ft/sec to > or equal to 2.8 ft/sec demonstrating improved functional mobility.   ? Time 4   ? Period Weeks   ? Target Date 03/18/22   ?  ? PT SHORT TERM GOAL #4  ? Title The patient will verbalize understanding of support group and community class offerings for [employer]!.   ? Time 4   ? Period Weeks   ? Target Date 03/18/22   ? ?  ?  ? ?  ? ? ? ? PT Long Term Goals - 02/16/22 1314   ? ?  ? PT LONG TERM GOAL #1  ? Title The patient will be indep with HEP progression.   ? Time 8   ? Period Weeks   ? Target Date 04/17/22   ?  ? PT LONG TERM GOAL #2  ? Title Improve mini BEST test from 18/28 to > or equal to 22/28.   ? Time 8   ?  Period Weeks   ? Target Date 04/17/22   ?  ? PT LONG TERM GOAL #3  ? Title The patient will demonstrate more normalized gait pattern with reciprocal arm swing and L knee flexion with gait.   ? Time 8   ? Period Weeks   ? Target Date 04/17/22   ?  ? PT LONG TERM GOAL #4  ? Title The patient will negotiate steps with reciprocal pattern and one HR.   ? Time 8   ? Period Weeks   ? Target Date 04/17/22   ? ?  ?  ? ?  ? ? ? ? ? ? ? ? Plan - 03/04/22 1351   ? ? Clinical Impression Statement The patient is responding well to exercises for improved mobility.  She continues to need cues for L heel strike and arm swing during gait.  PT continuing to progress large amplitude activities for improved balance and gait.   ? PT Treatment/Interventions ADLs/Self Care Home Management;Patient/family education;Gait training;Stair training;Functional mobility training;Therapeutic activities;Therapeutic exercise;Neuromuscular re-education;Balance training;Manual techniques   ? PT Next Visit Plan continue progressing large amplitude/PWR! standing activities near support surface for home, backwards walking, balance strategies, UE reciprocal movement during gait.  Add more dynamic gait activities of head motion, obstacles and turns   ? Consulted and Agree with Plan of Care Patient   ? ?  ?  ? ?  ? ? ?Patient will benefit from skilled therapeutic intervention in order to improve the following deficits and impairments:  Abnormal gait, Difficulty walking, Decreased strength, Postural dysfunction, Decreased balance ? ?Visit Diagnosis: ?Muscle weakness (generalized) ? ?Unsteadiness on feet ? ?Other abnormalities of gait and mobility ? ? ? ? ?Problem List ?Patient Active Problem List  ? Diagnosis Date Noted  ? Parkinson's disease ([place]) 02/04/2022  ? Hypotension   ? Elevated troponin   ? Nausea   ? Nausea and vomiting   ? Septic shock ([place]) 06/05/2020  ? AKI (acute kidney injury) ([place])   ? Metabolic acidosis, increased anion gap   ? Dyspnea   ?  Agatston coronary artery calcium score between 100 and 199   ? Murmur 11/11/2016  ?  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 ? ?[place], [place] ?03/04/2022, 1:52 PM ? ?[place] ?[facility] ?[address], [address] ?[place], [place], [address] ?Phone: [phone]   Fax:  [phone] ? ?Name: DAMICA GRAVLIN ?MRN: [id number] ?Date of Birth: 05/24/1941 ? ? ? ?

## 2022-03-06 ENCOUNTER — Ambulatory Visit: Payer: Medicare PPO | Admitting: Rehabilitative and Restorative Service Providers"

## 2022-03-06 ENCOUNTER — Encounter: Payer: Self-pay | Admitting: Rehabilitative and Restorative Service Providers"

## 2022-03-06 DIAGNOSIS — R2681 Unsteadiness on feet: Secondary | ICD-10-CM | POA: Diagnosis not present

## 2022-03-06 DIAGNOSIS — R2689 Other abnormalities of gait and mobility: Secondary | ICD-10-CM

## 2022-03-06 DIAGNOSIS — R29818 Other symptoms and signs involving the nervous system: Secondary | ICD-10-CM | POA: Diagnosis not present

## 2022-03-06 DIAGNOSIS — M6281 Muscle weakness (generalized): Secondary | ICD-10-CM | POA: Diagnosis not present

## 2022-03-06 DIAGNOSIS — R293 Abnormal posture: Secondary | ICD-10-CM | POA: Diagnosis not present

## 2022-03-06 DIAGNOSIS — R278 Other lack of coordination: Secondary | ICD-10-CM | POA: Diagnosis not present

## 2022-03-06 NOTE — Therapy (Signed)
[place] ?[facility] ?[address], [address] ?[place], [place], [address] ?Phone: [phone]   Fax:  [phone] ? ?Physical Therapy Treatment ? ?Patient Details  ?Name: Alexandria Wallace ?MRN: [id number] ?Date of Birth: June 09, 1941 ?Referring Provider (PT): Alonza Bogus, DO ? ? ?Encounter Date: 03/06/2022 ? ? PT End of Session - 03/06/22 0939   ? ? Visit Number 4   ? Number of Visits 16   ? Date for PT Re-Evaluation 04/17/22   ? Authorization Type [employer] Medicare   ? PT Start Time [phone]   ? PT Stop Time 1015   ? PT Time Calculation (min) 39 min   ? Equipment Utilized During Treatment Gait belt   ? Activity Tolerance Patient tolerated treatment well   ? Behavior During Therapy [facility] for tasks assessed/performed   ? ?  ?  ? ?  ? ? ?Past Medical History:  ?Diagnosis Date  ? Colon polyp   ? High cholesterol   ? Mood disorder ([place])   ? ? ?Past Surgical History:  ?Procedure Laterality Date  ? COLECTOMY    ? EUS N/A 11/29/2015  ? Procedure: LOWER ENDOSCOPIC ULTRASOUND (EUS);  Surgeon: Arta Silence, MD;  Location: [facility] ENDOSCOPY;  Service: Endoscopy;  Laterality: N/A;  ? FLEXIBLE SIGMOIDOSCOPY N/A 01/27/2018  ? Procedure: FLEXIBLE SIGMOIDOSCOPY;  Surgeon: Otis Brace, MD;  Location: [facility] ENDOSCOPY;  Service: Gastroenterology;  Laterality: N/A;  ? LEFT HEART CATH AND CORONARY ANGIOGRAPHY N/A 11/15/2018  ? Procedure: LEFT HEART CATH AND CORONARY ANGIOGRAPHY;  Surgeon: Troy Sine, MD;  Location: [place] CV LAB;  Service: Cardiovascular;  Laterality: N/A;  ? POLYPECTOMY  02/19/2016  ? ? ?There were no vitals filed for this visit. ? ? Subjective Assessment - 03/06/22 0938   ? ? Subjective The patient reports she is scattered this morning and thought her appointment was earlier.  She was sore yesterday from ther ex, but feels better today.   ? Pertinent History hypercholesterolemia   ? Patient Stated Goals "To walk perfect"   ? Currently in Pain? No/denies   ? ?  ?  ? ?   ? ? ? ? ? ? ? ? ? ? ? ? ? ? ? ? ? ? ? ? [place] Adult PT Treatment/Exercise - 03/06/22 0953   ? ?  ? Ambulation/Gait  ? Ambulation/Gait Yes   ? Ambulation/Gait Assistance 7: Independent   ? Gait Comments Gait with emphasis on large amplitude arm swing, gait on treadmill at 1.5 mph x 2 minutes with discussion of heel strike for cues.  Dynamic gait with obstacl enegotiation over 6" surface and through cones for figure 8.   ?  ? Neuro Re-ed   ? Neuro Re-ed Details  large amplitude marching with alternating LEs x 40 feet x 3 reps, standing rock and reach for weight shift and arm swing, standing large amplitude turns coming back to middle T for positioning cues and posture, quadriped to 1/2 kneeling, quadriped to tall kneeling positioning x 5 reps. Alternating cone taps for weight shift.  Discussed weight shifting to help with turning in place working on initiating turn and larger amplitude motions.   ?  ? Exercises  ? Exercises Other Exercises;Shoulder   ? Other Exercises  thread the needle x 5 reps R and L sides, standing door frame stretch, supine towel roll stretch T for pec opening, standing W for scapular retraction x 10 reps, standing L for scap squeeze   ? ?  ?  ? ?  ? ? ? ? ? ? ? ? ? ? ? ?  PT Short Term Goals - 02/16/22 1310   ? ?  ? PT SHORT TERM GOAL #1  ? Title The patient will be indep with initial HEP.   ? Time 4   ? Period Weeks   ? Target Date 03/18/22   ?  ? PT SHORT TERM GOAL #2  ? Title The patient will improve TUG cognitive to < or equal to 13 seconds to demo improved multi-tasking and mobility.   ? Time 4   ? Period Weeks   ? Target Date 03/18/22   ?  ? PT SHORT TERM GOAL #3  ? Title The patient will improve gait speed from 2.33 ft/sec to > or equal to 2.8 ft/sec demonstrating improved functional mobility.   ? Time 4   ? Period Weeks   ? Target Date 03/18/22   ?  ? PT SHORT TERM GOAL #4  ? Title The patient will verbalize understanding of support group and community class offerings for [employer]!.   ? Time 4    ? Period Weeks   ? Target Date 03/18/22   ? ?  ?  ? ?  ? ? ? ? PT Long Term Goals - 02/16/22 1314   ? ?  ? PT LONG TERM GOAL #1  ? Title The patient will be indep with HEP progression.   ? Time 8   ? Period Weeks   ? Target Date 04/17/22   ?  ? PT LONG TERM GOAL #2  ? Title Improve mini BEST test from 18/28 to > or equal to 22/28.   ? Time 8   ? Period Weeks   ? Target Date 04/17/22   ?  ? PT LONG TERM GOAL #3  ? Title The patient will demonstrate more normalized gait pattern with reciprocal arm swing and L knee flexion with gait.   ? Time 8   ? Period Weeks   ? Target Date 04/17/22   ?  ? PT LONG TERM GOAL #4  ? Title The patient will negotiate steps with reciprocal pattern and one HR.   ? Time 8   ? Period Weeks   ? Target Date 04/17/22   ? ?  ?  ? ?  ? ? ? ? ? ? ? ? Plan - 03/06/22 1206   ? ? Clinical Impression Statement The patient is motivated to participate and shows great progress with cues and repetition.  PT continuing to progress to goals. Patient requested 1x/week to allow for greater time to work on home exercises between sessions.  PT reduced to Mondays to also allow patient the ability to participate in a Wednesday PD class.   ? PT Treatment/Interventions ADLs/Self Care Home Management;Patient/family education;Gait training;Stair training;Functional mobility training;Therapeutic activities;Therapeutic exercise;Neuromuscular re-education;Balance training;Manual techniques   ? PT Next Visit Plan continue progressing large amplitude/PWR! standing activities near support surface for home, backwards walking, balance strategies, UE reciprocal movement during gait.  Add more dynamic gait activities of head motion, obstacles and turns   ? Consulted and Agree with Plan of Care Patient   ? ?  ?  ? ?  ? ? ?Patient will benefit from skilled therapeutic intervention in order to improve the following deficits and impairments:  Abnormal gait, Difficulty walking, Decreased strength, Postural dysfunction, Decreased  balance ? ?Visit Diagnosis: ?Muscle weakness (generalized) ? ?Unsteadiness on feet ? ?Other abnormalities of gait and mobility ? ? ? ? ?Problem List ?Patient Active Problem List  ? Diagnosis Date Noted  ? Parkinson's  disease ([place]) 02/04/2022  ? Hypotension   ? Elevated troponin   ? Nausea   ? Nausea and vomiting   ? Septic shock ([place]) 06/05/2020  ? AKI (acute kidney injury) ([place])   ? Metabolic acidosis, increased anion gap   ? Dyspnea   ? Agatston coronary artery calcium score between 100 and 199   ? Murmur 11/11/2016  ? 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 ? ?[place], [place] ?03/06/2022, 12:07 PM ? ?[place] ?[facility] ?[address], [address] ?[place], [place], [address] ?Phone: [phone]   Fax:  [phone] ? ?Name: Alexandria Wallace ?MRN: [id number] ?Date of Birth: [birth year] ? ? ? ?

## 2022-03-09 ENCOUNTER — Ambulatory Visit: Payer: Medicare PPO | Admitting: Occupational Therapy

## 2022-03-09 ENCOUNTER — Encounter: Payer: Self-pay | Admitting: Physical Therapy

## 2022-03-09 ENCOUNTER — Ambulatory Visit: Payer: Medicare PPO | Admitting: Physical Therapy

## 2022-03-09 ENCOUNTER — Encounter: Payer: Self-pay | Admitting: Occupational Therapy

## 2022-03-09 DIAGNOSIS — R2681 Unsteadiness on feet: Secondary | ICD-10-CM

## 2022-03-09 DIAGNOSIS — M6281 Muscle weakness (generalized): Secondary | ICD-10-CM | POA: Diagnosis not present

## 2022-03-09 DIAGNOSIS — R278 Other lack of coordination: Secondary | ICD-10-CM

## 2022-03-09 DIAGNOSIS — R29818 Other symptoms and signs involving the nervous system: Secondary | ICD-10-CM | POA: Diagnosis not present

## 2022-03-09 DIAGNOSIS — R2689 Other abnormalities of gait and mobility: Secondary | ICD-10-CM | POA: Diagnosis not present

## 2022-03-09 DIAGNOSIS — R293 Abnormal posture: Secondary | ICD-10-CM | POA: Diagnosis not present

## 2022-03-09 NOTE — Therapy (Signed)
[place] ?[facility] ?[address], [address] ?[place], [place], [address] ?Phone: [phone]   Fax:  [phone] ? ?Occupational Therapy Treatment ? ?Patient Details  ?Name: Alexandria Wallace ?MRN: [id number] ?Date of Birth: 1941-07-03 ?Referring Provider (OT): Tat, Eustace Quail, DO ? ? ?Encounter Date: 03/09/2022 ? ? OT End of Session - 03/09/22 1321   ? ? Visit Number 4   ? Number of Visits 9   ? Date for OT Re-Evaluation 04/17/22   ? Authorization Type [employer] Medicare   ? OT Start Time 1318   ? OT Stop Time 1400   ? OT Time Calculation (min) 42 min   ? Activity Tolerance Patient tolerated treatment well   ? Behavior During Therapy [facility] for tasks assessed/performed   ? ?  ?  ? ?  ? ? ?Past Medical History:  ?Diagnosis Date  ? Colon polyp   ? High cholesterol   ? Mood disorder ([place])   ? ? ?Past Surgical History:  ?Procedure Laterality Date  ? COLECTOMY    ? EUS N/A 11/29/2015  ? Procedure: LOWER ENDOSCOPIC ULTRASOUND (EUS);  Surgeon: Arta Silence, MD;  Location: [facility] ENDOSCOPY;  Service: Endoscopy;  Laterality: N/A;  ? FLEXIBLE SIGMOIDOSCOPY N/A 01/27/2018  ? Procedure: FLEXIBLE SIGMOIDOSCOPY;  Surgeon: Otis Brace, MD;  Location: [facility] ENDOSCOPY;  Service: Gastroenterology;  Laterality: N/A;  ? LEFT HEART CATH AND CORONARY ANGIOGRAPHY N/A 11/15/2018  ? Procedure: LEFT HEART CATH AND CORONARY ANGIOGRAPHY;  Surgeon: Troy Sine, MD;  Location: [place] CV LAB;  Service: Cardiovascular;  Laterality: N/A;  ? POLYPECTOMY  02/19/2016  ? ? ?There were no vitals filed for this visit. ? ? Subjective Assessment - 03/09/22 1320   ? ? Subjective  "I've been working out"   ? Pertinent History high cholesterol, mood disorder   ? Patient Stated Goals To walk better and get stronger   ? Currently in Pain? Yes   ? Pain Score 1    ? Pain Location Back   ? Pain Onset More than a month ago   ? ?  ?  ? ?  ? ? ? ?NMR: Reiterated large amplitude hand movements.  Pt demonstrating appropriate technique  with hand open, wrist flexion/extension, supination/pronation.  Therapist providing verbal and demonstration cues for technique with big open movement with thumb opposition to fully open/extend fingers.  Engaged in forward reach in sitting with focus on elbow extension.  Pt demonstrating good technique.  Increased challenge to overhead reach with elbow extension to simulate placing items in overhead cabinet.  Incorporated sidestepping with elbow extension/reach.  Pt demonstrating awareness of decreased elbow extension, however able to recognize and correct without additional cues.   ? ?Engaged in handwriting. Pt reports much improvements in size and legibility since beginning PD medication.  Pt completed 10 item grocery list and short 3-5 sentence passage with overall improved size and legibility.   ? ?Card flipping with focus on large amplitude and open hand movements when releasing cards.  Pt demonstrating good technique with picking up cards, but requiring min-mod cues for supination and open hand when releasing cards.   ? ?Discussed large amplitude with turning lids on jars.  Pt reports still having difficulty with opening containers.  Educated on ball rotation in finger tips and modified task to placing on table to increase amplitude of rotation to carry over to opening containers.  Pt reports having a grip pad to assist with opening jars.   ? ? ? ? ? ? ? ? ? ? ? ? ? ? ? ? ? ? ?  OT Short Term Goals - 02/23/22 1024   ? ?  ? OT SHORT TERM GOAL #1  ? Title Pt will be independent with PD specific HEP   ? Time 4   ? Period Weeks   ? Status On-going   ? Target Date 03/20/22   ?  ? OT SHORT TERM GOAL #2  ? Title Pt will verbalize understanding of adapted strategies to maximize safety and I with ADLs/ IADLs.   ? Time 4   ? Period Weeks   ? Status On-going   ?  ? OT SHORT TERM GOAL #3  ? Title Pt will demonstrate improved UE functional use for ADLs as evidenced by increasing box/ blocks score by 2 blocks with LUE.   ?  Baseline R: 48 and L: 46 (pt bumping into partition 25% of time on L)   ? Time 4   ? Period Weeks   ? Status On-going   ? ?  ?  ? ?  ? ? ? ? OT Long Term Goals - 02/23/22 1025   ? ?  ? OT LONG TERM GOAL #1  ? Title Pt will write a short paragraph with 100% legibility and no significant decrease in letter size   ? Time 8   ? Period Weeks   ? Status On-going   ? Target Date 04/17/22   ?  ? OT LONG TERM GOAL #2  ? Title Pt will demonstrate improved ease with fastening buttons as evidenced by decreasing 3 button/ unbutton time to 21 seconds or less.   ? Baseline 24.5   ? Time 8   ? Period Weeks   ? Status On-going   ?  ? OT LONG TERM GOAL #3  ? Title Pt will demonstrate ability to retrieve a lightweight object at 150 shoulder flexion with LUE   ? Baseline R: 150, L: 140   ? Time 8   ? Period Weeks   ? Status On-going   ?  ? OT LONG TERM GOAL #4  ? Title Pt will demonstrate understanding of memory compensations and ways to keep thinking skills sharp   ? Time 8   ? Period Weeks   ? Status On-going   ?  ? OT LONG TERM GOAL #5  ? Title Pt will verbalize understanding of ways to prevent future PD related complications and PD community resources.   ? Time 8   ? Period Weeks   ? Status On-going   ? ?  ?  ? ?  ? ? ? ? ? ? ? ? Plan - 03/09/22 1321   ? ? Clinical Impression Statement Pt continues to demonstrate mild decrease in ROM in LUE when completing large ampitude exercises this session.  Pt receptive to education to increase attention to LUE and increase intentionality with movements both during exercises and functional tasks.  Pt demonstrating carryover of education from previous session and receptive to cues for modifications to increase amplitude especially with thumb opposition and finger flicks with elbow extension.  Pt noting improvements in hand writing and coordination with dressing tasks.   ? OT Occupational Profile and History Detailed Assessment- Review of Records and additional review of physical, cognitive,  psychosocial history related to current functional performance   ? Occupational performance deficits (Please refer to evaluation for details): ADL's;IADL's   ? Body Structure / Function / Physical Skills ADL;Balance;Body mechanics;Coordination;Decreased knowledge of precautions;Decreased knowledge of use of DME;Dexterity;Endurance;Flexibility;FMC;GMC;IADL;Mobility;ROM;Pain;Strength;UE functional use;Tone   ? Cognitive Skills Memory   ?  Rehab Potential Good   ? Clinical Decision Making Limited treatment options, no task modification necessary   ? Comorbidities Affecting Occupational Performance: May have comorbidities impacting occupational performance   ? Modification or Assistance to Complete Evaluation  No modification of tasks or assist necessary to complete eval   ? OT Frequency 1x / week   ? OT Duration 8 weeks   ? OT Treatment/Interventions Self-care/ADL training;Moist Heat;Ultrasound;Therapeutic exercise;Energy conservation;Neuromuscular education;DME and/or AE instruction;Functional Mobility Training;Manual Therapy;Therapeutic activities;Cognitive remediation/compensation;Patient/family education;Balance training   ? Plan reiterate large amplitude movements, begin education on bag exercises for self-care   ? [place]   ? Consulted and Agree with Plan of Care Patient   ? ?  ?  ? ?  ? ? ?Patient will benefit from skilled therapeutic intervention in order to improve the following deficits and impairments:   ?Body Structure / Function / Physical Skills: ADL, Balance, Body mechanics, Coordination, Decreased knowledge of precautions, Decreased knowledge of use of DME, Dexterity, Endurance, Flexibility, FMC, GMC, IADL, Mobility, ROM, Pain, Strength, UE functional use, Tone ?Cognitive Skills: Memory ?  ? ? ?Visit Diagnosis: ?Muscle weakness (generalized) ? ?Unsteadiness on feet ? ?Other lack of coordination ? ?Abnormal posture ? ?Other symptoms and signs involving the nervous  system ? ? ? ?Problem List ?Patient Active Problem List  ? Diagnosis Date Noted  ? Parkinson's disease ([place]) 02/04/2022  ? Hypotension   ? Elevated troponin   ? Nausea   ? Nausea and vomiting   ? Septic shock ([place]) 07/07/202

## 2022-03-09 NOTE — Therapy (Signed)
[place] ?[facility] ?[address], [address] ?[place], [place], [address] ?Phone: [phone]   Fax:  [phone] ? ?Physical Therapy Treatment ? ?Patient Details  ?Name: Alexandria Wallace ?MRN: [id number] ?Date of Birth: 1941/11/01 ?Referring Provider (PT): Alonza Bogus, DO ? ? ?Encounter Date: 03/09/2022 ? ? PT End of Session - 03/09/22 1321   ? ? Visit Number 5   ? Number of Visits 16   ? Date for PT Re-Evaluation 04/17/22   ? Authorization Type [employer] Medicare   ? PT Start Time 1234   ? PT Stop Time 1314   ? PT Time Calculation (min) 40 min   ? Equipment Utilized During Treatment Gait belt   ? Activity Tolerance Patient tolerated treatment well   ? Behavior During Therapy [facility] for tasks assessed/performed   ? ?  ?  ? ?  ? ? ?Past Medical History:  ?Diagnosis Date  ? Colon polyp   ? High cholesterol   ? Mood disorder ([place])   ? ? ?Past Surgical History:  ?Procedure Laterality Date  ? COLECTOMY    ? EUS N/A 11/29/2015  ? Procedure: LOWER ENDOSCOPIC ULTRASOUND (EUS);  Surgeon: Arta Silence, MD;  Location: [facility] ENDOSCOPY;  Service: Endoscopy;  Laterality: N/A;  ? FLEXIBLE SIGMOIDOSCOPY N/A 01/27/2018  ? Procedure: FLEXIBLE SIGMOIDOSCOPY;  Surgeon: Otis Brace, MD;  Location: [facility] ENDOSCOPY;  Service: Gastroenterology;  Laterality: N/A;  ? LEFT HEART CATH AND CORONARY ANGIOGRAPHY N/A 11/15/2018  ? Procedure: LEFT HEART CATH AND CORONARY ANGIOGRAPHY;  Surgeon: Troy Sine, MD;  Location: [facility];  Service: Cardiovascular;  Laterality: N/A;  ? POLYPECTOMY  02/19/2016  ? ? ?There were no vitals filed for this visit. ? ? Subjective Assessment - 03/09/22 1235   ? ? Subjective No changes from last visit.   ? Pertinent History hypercholesterolemia   ? Patient Stated Goals "To walk perfect"   ? Currently in Pain? Yes   ? Pain Score 1    ? Pain Location Back   ? Pain Descriptors / Indicators Discomfort   ? Pain Onset More than a month ago   ? Pain Frequency Intermittent   ?  Aggravating Factors  sitting wrong; standing still too long   ? Pain Relieving Factors exercise   ? ?  ?  ? ?  ? ? ? ? ? ? ? ? ? ? ? ? ? ? ? ? ? ? ? ? [place] Adult PT Treatment/Exercise - 03/09/22 0001   ? ?  ? Ambulation/Gait  ? Ambulation/Gait Yes   ? Ambulation/Gait Assistance 5: Supervision   ? Ambulation Distance (Feet) 240 Feet   170  ? Assistive device None   ? Gait Pattern Step-through pattern;Decreased arm swing - right;Decreased arm swing - left;Decreased step length - right;Decreased step length - left;Decreased stride length   ? Gait Comments Initiated use of Boomwhackers to facilitate increased arm swing.  Once they are removed, pt demonstrates difficulty with continuing reciprocal arm swing.   ?  ? Therapeutic Activites   ? Therapeutic Activities Other Therapeutic Activities   ? Other Therapeutic Activities Discussed PWR! Moves [employer] community fitness class, with pt verbalizing plans to attend.  Discussed varied positions as well and rationale for large amplitude movement patterns.   ? ?  ?  ? ?  ? ? ? ? [facility]) - 03/09/22 1239   ? ? PWR! exercises Moves in sitting;Moves in [place];Moves in supine   ? PWR! Up x  5 reps   ? PWR! Rock x 5 reps   ? PWR! Twist x 5 reps each side   ? PWR! Step x 5 reps each side   ? Comments PWR! Moves quadruped, cues for technique and intensity   ? PWR! Up x 5 reps through shoulders, x 5 thorugh hips   ? PWR! Rock x 5 each side   ? PWR! Twist x 5 reps each side   ? PWR! Step x 3 reps step/step/scoot each side   ? Comments PWR! Moves supine, cues for technique and sequence.   ? PWR! Up x 10   ? PWR! Rock x 10   ? PWR! Twist 5 reps each side   ? PWR! Step x 10 reps each side   ? Comments PWR! Moves sitting-cues for technique and intensity   ? ?  ?  ? ?  ? ? ? ? ? ? ? PT Education - 03/09/22 1321   ? ? Education Details PWR! Moves exercise class at [facility]; trying various PWR! Moves positions   ? Person(s) Educated Patient   ? Methods Explanation;Demonstration   ?  Comprehension Verbalized understanding   ? ?  ?  ? ?  ? ? ? PT Short Term Goals - 02/16/22 1310   ? ?  ? PT SHORT TERM GOAL #1  ? Title The patient will be indep with initial HEP.   ? Time 4   ? Period Weeks   ? Target Date 03/18/22   ?  ? PT SHORT TERM GOAL #2  ? Title The patient will improve TUG cognitive to < or equal to 13 seconds to demo improved multi-tasking and mobility.   ? Time 4   ? Period Weeks   ? Target Date 03/18/22   ?  ? PT SHORT TERM GOAL #3  ? Title The patient will improve gait speed from 2.33 ft/sec to > or equal to 2.8 ft/sec demonstrating improved functional mobility.   ? Time 4   ? Period Weeks   ? Target Date 03/18/22   ?  ? PT SHORT TERM GOAL #4  ? Title The patient will verbalize understanding of support group and community class offerings for [employer]!.   ? Time 4   ? Period Weeks   ? Target Date 03/18/22   ? ?  ?  ? ?  ? ? ? ? PT Long Term Goals - 02/16/22 1314   ? ?  ? PT LONG TERM GOAL #1  ? Title The patient will be indep with HEP progression.   ? Time 8   ? Period Weeks   ? Target Date 04/17/22   ?  ? PT LONG TERM GOAL #2  ? Title Improve mini BEST test from 18/28 to > or equal to 22/28.   ? Time 8   ? Period Weeks   ? Target Date 04/17/22   ?  ? PT LONG TERM GOAL #3  ? Title The patient will demonstrate more normalized gait pattern with reciprocal arm swing and L knee flexion with gait.   ? Time 8   ? Period Weeks   ? Target Date 04/17/22   ?  ? PT LONG TERM GOAL #4  ? Title The patient will negotiate steps with reciprocal pattern and one HR.   ? Time 8   ? Period Weeks   ? Target Date 04/17/22   ? ?  ?  ? ?  ? ? ? ? ? ? ? ?  Plan - 03/09/22 1336   ? ? Clinical Impression Statement Skilled PT session today focused on trial of PWR! Moves in other positions to prepare for PWR! Moves community exercise class.  Performed sitting, supine and quadruped, with pt tolerating well.  She needs extra time for cues for technique and intensity, but overall performs well in preparation for community  class.  Also worked on reciprocal pattern of arm swing with gait, with pt showing some bradykinesia in LUE and LLE with gait.  She will continue to benefit from skilled PT towards goals for imrpoved overall funcitonal mobility.   ? PT Treatment/Interventions ADLs/Self Care Home Management;Patient/family education;Gait training;Stair training;Functional mobility training;Therapeutic activities;Therapeutic exercise;Neuromuscular re-education;Balance training;Manual techniques   ? PT Next Visit Plan continue progressing large amplitude/PWR! standing activities near support surface for home, backwards walking, balance strategies, UE reciprocal movement during gait.  Add more dynamic gait activities of head motion, obstacles and turns.  Ask about PWR! Moves exercise classes   ? Consulted and Agree with Plan of Care Patient   ? ?  ?  ? ?  ? ? ?Patient will benefit from skilled therapeutic intervention in order to improve the following deficits and impairments:  Abnormal gait, Difficulty walking, Decreased strength, Postural dysfunction, Decreased balance ? ?Visit Diagnosis: ?Unsteadiness on feet ? ?Other abnormalities of gait and mobility ? ?Muscle weakness (generalized) ? ? ? ? ?Problem List ?Patient Active Problem List  ? Diagnosis Date Noted  ? Parkinson's disease ([place]) 02/04/2022  ? Hypotension   ? Elevated troponin   ? Nausea   ? Nausea and vomiting   ? Septic shock ([place]) 06/05/2020  ? AKI (acute kidney injury) ([place])   ? Metabolic acidosis, increased anion gap   ? Dyspnea   ? Agatston coronary artery calcium score between 100 and 199   ? Murmur 11/11/2016  ? 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 ? ?Tomi Grandpre W., PT ?03/09/2022, 1:54 PM ? ? ?[facility] ?[address], [address] ?[place], [place], [address] ?Phone: [phone]   Fax:  [phone] ? ?Name: ERRICA DUTIL ?MRN: [id number] ?Date of Birth: 09-Nov-1941 ? ? ? ?

## 2022-03-11 ENCOUNTER — Ambulatory Visit: Payer: Medicare PPO | Admitting: Rehabilitative and Restorative Service Providers"

## 2022-03-16 ENCOUNTER — Ambulatory Visit: Payer: Medicare PPO | Admitting: Physical Therapy

## 2022-03-16 ENCOUNTER — Encounter: Payer: Self-pay | Admitting: Physical Therapy

## 2022-03-16 ENCOUNTER — Encounter: Payer: Self-pay | Admitting: Occupational Therapy

## 2022-03-16 ENCOUNTER — Ambulatory Visit: Payer: Medicare PPO | Admitting: Occupational Therapy

## 2022-03-16 DIAGNOSIS — R293 Abnormal posture: Secondary | ICD-10-CM

## 2022-03-16 DIAGNOSIS — M6281 Muscle weakness (generalized): Secondary | ICD-10-CM

## 2022-03-16 DIAGNOSIS — R278 Other lack of coordination: Secondary | ICD-10-CM | POA: Diagnosis not present

## 2022-03-16 DIAGNOSIS — R2681 Unsteadiness on feet: Secondary | ICD-10-CM

## 2022-03-16 DIAGNOSIS — R29818 Other symptoms and signs involving the nervous system: Secondary | ICD-10-CM | POA: Diagnosis not present

## 2022-03-16 DIAGNOSIS — R2689 Other abnormalities of gait and mobility: Secondary | ICD-10-CM

## 2022-03-16 NOTE — Patient Instructions (Signed)
Access Code: [email or web address] ?URL: [email or web address] ?Date: 03/16/2022 ?Prepared by: [facility] ? ?Exercises ?- Sit to Stand with Arm Swing  - 2 x daily - 7 x weekly - 1 sets - 10 reps ?- Step Sideways with Arms Reaching  - 2 x daily - 7 x weekly - 1 sets - 10 reps ?- Side to Side Weight Shift with Overhead Reach and Counter Support  - 1-2 x daily - 7 x weekly - 2 sets - 10 reps ?- Standing Reach to Opposite Side with Weight Shift  - 1-2 x daily - 7 x weekly - 2 sets - 10 reps ?- Step Forward with Arms Reaching to Sides  - 2 x daily - 7 x weekly - 1 sets - 10 reps ?- Staggered Stance Weight Shift with Arms Reaching  - 2 x daily - 7 x weekly - 1 sets - 10 reps ?- Forward Step Over with Counter Support  - 1 x daily - 5 x weekly - 1-2 sets - 10 reps ?

## 2022-03-16 NOTE — Therapy (Signed)
[place] ?[facility] ?[address], [address] ?[place], [place], [address] ?Phone: [phone]   Fax:  [phone] ? ?Occupational Therapy Treatment ? ?Patient Details  ?Name: Alexandria Wallace ?MRN: [id number] ?Date of Birth: 09-Feb-1941 ?Referring Provider (OT): Tat, Eustace Quail, DO ? ? ?Encounter Date: 03/16/2022 ? ? OT End of Session - 03/16/22 1321   ? ? Visit Number 5   ? Number of Visits 9   ? Date for OT Re-Evaluation 04/17/22   ? Authorization Type [employer] Medicare   ? OT Start Time 1318   ? OT Stop Time 1400   ? OT Time Calculation (min) 42 min   ? Activity Tolerance Patient tolerated treatment well   ? Behavior During Therapy [facility] for tasks assessed/performed   ? ?  ?  ? ?  ? ? ?Past Medical History:  ?Diagnosis Date  ? Colon polyp   ? High cholesterol   ? Mood disorder ([place])   ? ? ?Past Surgical History:  ?Procedure Laterality Date  ? COLECTOMY    ? EUS N/A 11/29/2015  ? Procedure: LOWER ENDOSCOPIC ULTRASOUND (EUS);  Surgeon: Arta Silence, MD;  Location: [facility] ENDOSCOPY;  Service: Endoscopy;  Laterality: N/A;  ? FLEXIBLE SIGMOIDOSCOPY N/A 01/27/2018  ? Procedure: FLEXIBLE SIGMOIDOSCOPY;  Surgeon: Otis Brace, MD;  Location: [facility] ENDOSCOPY;  Service: Gastroenterology;  Laterality: N/A;  ? LEFT HEART CATH AND CORONARY ANGIOGRAPHY N/A 11/15/2018  ? Procedure: LEFT HEART CATH AND CORONARY ANGIOGRAPHY;  Surgeon: Troy Sine, MD;  Location: [facility];  Service: Cardiovascular;  Laterality: N/A;  ? POLYPECTOMY  02/19/2016  ? ? ?There were no vitals filed for this visit. ? ? Subjective Assessment - 03/16/22 1319   ? ? Subjective  "The ball exercise is hard"   ? Pertinent History high cholesterol, mood disorder   ? Patient Stated Goals To walk better and get stronger   ? Currently in Pain? No/denies   ? Pain Onset More than a month ago   ? ?  ?  ? ?  ? ? ?Large amplitude movements: ball rotation on table with focus on large amplitude rotations to carry over to opening  containers.  Pt reports difficulty with ball rotation in fingertips, therefore modified task to completing on table with increased focus on large amplitude rotation.  Pt completed bilaterally with focus on quality of movement. ? ?Donning/doffing jacket: Engaged in massed practice of donning and doffing jacket with hospital gown.  Provided pt with cues for donning L side first due to mild decrease in supination and shoulder external/internal rotation when donning jacket.  Therapist demonstrating technique for doffing jacket with pt demonstrating good technique and carryover after massed practice.  Therapist reiterated importance of large amplitude movements during donning and doffing jacket as well as other self-care tasks. ? ? ? [facility] OT Assessment - 03/16/22 0001   ? ?  ? Coordination  ? Box and Blocks R: 53 and L: 53   much improved quality  ? ?  ?  ? ?  ? ? ? ? ? ? ? ? ? ? ? ? ? ? ? ? ? ? ? ? ? OT Short Term Goals - 03/16/22 1324   ? ?  ? OT SHORT TERM GOAL #1  ? Title Pt will be independent with PD specific HEP   ? Time 4   ? Period Weeks   ? Status Achieved   ? Target Date 03/20/22   ?  ? OT  SHORT TERM GOAL #2  ? Title Pt will verbalize understanding of adapted strategies to maximize safety and I with ADLs/ IADLs.   ? Time 4   ? Period Weeks   ? Status Achieved   ?  ? OT SHORT TERM GOAL #3  ? Title Pt will demonstrate improved UE functional use for ADLs as evidenced by increasing box/ blocks score by 2 blocks with LUE.   R: 53 and L: 53  ? Baseline R: 48 and L: 46 (pt bumping into partition 25% of time on L)   ? Time 4   ? Period Weeks   ? Status Achieved   ? ?  ?  ? ?  ? ? ? ? OT Long Term Goals - 03/09/22 1452   ? ?  ? OT LONG TERM GOAL #1  ? Title Pt will write a short paragraph with 100% legibility and no significant decrease in letter size   ? Time 8   ? Period Weeks   ? Status Achieved   ? Target Date 04/17/22   ?  ? OT LONG TERM GOAL #2  ? Title Pt will demonstrate improved ease with fastening buttons as  evidenced by decreasing 3 button/ unbutton time to 21 seconds or less.   ? Baseline 24.5   ? Time 8   ? Period Weeks   ? Status On-going   ?  ? OT LONG TERM GOAL #3  ? Title Pt will demonstrate ability to retrieve a lightweight object at 150 shoulder flexion with LUE   ? Baseline R: 150, L: 140   ? Time 8   ? Period Weeks   ? Status On-going   ?  ? OT LONG TERM GOAL #4  ? Title Pt will demonstrate understanding of memory compensations and ways to keep thinking skills sharp   ? Time 8   ? Period Weeks   ? Status On-going   ?  ? OT LONG TERM GOAL #5  ? Title Pt will verbalize understanding of ways to prevent future PD related complications and PD community resources.   ? Time 8   ? Period Weeks   ? Status On-going   ? ?  ?  ? ?  ? ? ? ? ? ? ? ? Plan - 03/16/22 1321   ? ? Clinical Impression Statement Pt appreciative of education on large amplitude "cape technique" with donning jacket and large amplitude doffing of jacket.  Pt completed massed practice of donning/doffing jacket with focus on large amplitude movements with noted improvements.  Pt pleased with modifications and reporting improvements, planning to carry over to home practice.  Pt extremely appreciative of modifications and techniques to increase independence with ADLs and IADLs.  Pt demonstrating much improved quality of movement with donning/doffing jacket and with Box and Blocks assessment.   ? OT Occupational Profile and History Detailed Assessment- Review of Records and additional review of physical, cognitive, psychosocial history related to current functional performance   ? Occupational performance deficits (Please refer to evaluation for details): ADL's;IADL's   ? Body Structure / Function / Physical Skills ADL;Balance;Body mechanics;Coordination;Decreased knowledge of precautions;Decreased knowledge of use of DME;Dexterity;Endurance;Flexibility;FMC;GMC;IADL;Mobility;ROM;Pain;Strength;UE functional use;Tone   ? Cognitive Skills Memory   ? Rehab  Potential Good   ? Clinical Decision Making Limited treatment options, no task modification necessary   ? Comorbidities Affecting Occupational Performance: May have comorbidities impacting occupational performance   ? Modification or Assistance to Complete Evaluation  No modification of tasks or  assist necessary to complete eval   ? OT Frequency 1x / week   ? OT Duration 8 weeks   ? OT Treatment/Interventions Self-care/ADL training;Moist Heat;Ultrasound;Therapeutic exercise;Energy conservation;Neuromuscular education;DME and/or AE instruction;Functional Mobility Training;Manual Therapy;Therapeutic activities;Cognitive remediation/compensation;Patient/family education;Balance training   ? Plan reiterate large amplitude movements, begin education on bag exercises for self-care   ? [place]   ? Consulted and Agree with Plan of Care Patient   ? ?  ?  ? ?  ? ? ?Patient will benefit from skilled therapeutic intervention in order to improve the following deficits and impairments:   ?Body Structure / Function / Physical Skills: ADL, Balance, Body mechanics, Coordination, Decreased knowledge of precautions, Decreased knowledge of use of DME, Dexterity, Endurance, Flexibility, FMC, GMC, IADL, Mobility, ROM, Pain, Strength, UE functional use, Tone ?Cognitive Skills: Memory ?  ? ? ?Visit Diagnosis: ?Muscle weakness (generalized) ? ?Other lack of coordination ? ?Abnormal posture ? ?Other symptoms and signs involving the nervous system ? ? ? ?Problem List ?Patient Active Problem List  ? Diagnosis Date Noted  ? Parkinson's disease ([place]) 02/04/2022  ? Hypotension   ? Elevated troponin   ? Nausea   ? Nausea and vomiting   ? Septic shock ([place]) 06/05/2020  ? AKI (acute kidney injury) ([place])   ? Metabolic acidosis, increased anion gap   ? Dyspnea   ? Agatston coronary artery calcium score between 100 and 199   ? Murmur 11/11/2016  ? 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 ? Simonne Come, [place] ?03/16/2022, 3:44 PM ? ?[facility] ?[facility] ?[address], [address] ?[place], [place], [address] ?Phone: [phone]   Fax:  [phone] ? ?Name: DAVEDA LAROCK ?MRN: [id number] ?Date of Birth: 1941/06/18 ?

## 2022-03-16 NOTE — Therapy (Signed)
[place] ?[facility] ?[address], [address] ?[place], [place], [address] ?Phone: [phone]   Fax:  [phone] ? ?Physical Therapy Treatment ? ?Patient Details  ?Name: [place] Wallace ?MRN: [id number] ?Date of Birth: 1941-08-31 ?Referring Provider (PT): Alonza Bogus, DO ? ? ?Encounter Date: 03/16/2022 ? ? PT End of Session - 03/16/22 1316   ? ? Visit Number 6   ? Number of Visits 16   ? Date for PT Re-Evaluation 04/17/22   ? Authorization Type [employer] Medicare   ? PT Start Time 1232   ? PT Stop Time 1313   ? PT Time Calculation (min) 41 min   ? Equipment Utilized During Treatment Gait belt   ? Activity Tolerance Patient tolerated treatment well   ? Behavior During Therapy [facility] for tasks assessed/performed   ? ?  ?  ? ?  ? ? ?Past Medical History:  ?Diagnosis Date  ? Colon polyp   ? High cholesterol   ? Mood disorder ([place])   ? ? ?Past Surgical History:  ?Procedure Laterality Date  ? COLECTOMY    ? EUS N/A 11/29/2015  ? Procedure: LOWER ENDOSCOPIC ULTRASOUND (EUS);  Surgeon: Arta Silence, MD;  Location: [facility] ENDOSCOPY;  Service: Endoscopy;  Laterality: N/A;  ? FLEXIBLE SIGMOIDOSCOPY N/A 01/27/2018  ? Procedure: FLEXIBLE SIGMOIDOSCOPY;  Surgeon: Otis Brace, MD;  Location: [place] ENDOSCOPY;  Service: Gastroenterology;  Laterality: N/A;  ? LEFT HEART CATH AND CORONARY ANGIOGRAPHY N/A 11/15/2018  ? Procedure: LEFT HEART CATH AND CORONARY ANGIOGRAPHY;  Surgeon: Troy Sine, MD;  Location: [place] CV LAB;  Service: Cardiovascular;  Laterality: N/A;  ? POLYPECTOMY  02/19/2016  ? ? ?There were no vitals filed for this visit. ? ? Subjective Assessment - 03/16/22 1237   ? ? Subjective Enjoyed the exercise program last week (P[employer]! [employer]oves at [place]), and plan to continue.  L leg sometimes drags-want to know what to do about that.   ? Pertinent History hypercholesterolemia   ? Patient Stated Goals "To walk perfect"   ? Currently in Pain? No/denies   ? Pain Onset More than a month ago   ? ?   ?  ? ?  ? ? ? ? ? ? ? ? ? ? ? ? ? ? ? ? ? ? ? ? [place] Adult PT Treatment/Exercise - 03/16/22 0001   ? ?  ? Ambulation/Gait  ? Ambulation/Gait Yes   ? Ambulation/Gait Assistance 5: Supervision   ? Ambulation Distance (Feet) 425 Feet   for warm-up, then with walking poles to facilitate reciprocal arm swing/then poles removed; x 510 ft  ? Assistive device None   ? Gait Pattern Step-through pattern;Decreased arm swing - right;Decreased arm swing - left;Decreased step length - right;Decreased step length - left;Decreased stride length   ? Gait Comments PT behind pt using bilat walking poles to faciliate reciprocal arm swing/ with pt focusing on increased LLE step length and foot clearance.  Cues for equal/even LLE step length   ? ?  ?  ? ?  ? ? ? ? ? ? Balance Exercises - 03/16/22 0001   ? ?  ? Balance Exercises: Standing  ? SLS with Vectors Solid surface;Intermittent upper extremity assist;Limitations   ? SLS with Vectors Limitations Alt step taps to 6" step   ? Stepping Strategy Anterior;Posterior;Lateral;UE support;10 reps;Limitations   ? Stepping Strategy Limitations Forward and side stepping over low orange hurdles, for increased step length and height.   ? Marching Solid surface;Upper  extremity assist 1;Intermittent upper extremity assist;10 reps;Limitations   ? Marching Limitations 3 sets x 10 reps, marching in place, tactile, visual, auditory cues for increased LLE foot clearance/step height.   ? Other Standing Exercises Stagger stance forward/back rocking with LLE arm swing x 10 reps each foot position.   ? Other Standing Exercises Comments Forward step taps>runner's stretch position, 10 reps to focus on intensity, speed with LLE.   ? ?  ?  ? ?  ? ?SEated hamstring stretch, foot propped on 4" block:  30 sec, 3 reps each leg. ? ? ? PT Education - 03/16/22 1312   ? ? Education Details Additions to HEP-see instructions; increased intensity of movement patterns for increased LLE timing/coordination   ? Person(s)  Educated Patient   ? Methods Explanation;Demonstration;Handout   ? Comprehension Verbalized understanding;Returned demonstration   ? ?  ?  ? ?  ? ? ? PT Short Term Goals - 02/16/22 1310   ? ?  ? PT SHORT TERM GOAL #1  ? Title The patient will be indep with initial HEP.   ? Time 4   ? Period Weeks   ? Target Date 03/18/22   ?  ? PT SHORT TERM GOAL #2  ? Title The patient will improve TUG cognitive to < or equal to 13 seconds to demo improved multi-tasking and mobility.   ? Time 4   ? Period Weeks   ? Target Date 03/18/22   ?  ? PT SHORT TERM GOAL #3  ? Title The patient will improve gait speed from 2.33 ft/sec to > or equal to 2.8 ft/sec demonstrating improved functional mobility.   ? Time 4   ? Period Weeks   ? Target Date 03/18/22   ?  ? PT SHORT TERM GOAL #4  ? Title The patient will verbalize understanding of support group and community class offerings for [employer]!.   ? Time 4   ? Period Weeks   ? Target Date 03/18/22   ? ?  ?  ? ?  ? ? ? ? PT Long Term Goals - 02/16/22 1314   ? ?  ? PT LONG TERM GOAL #1  ? Title The patient will be indep with HEP progression.   ? Time 8   ? Period Weeks   ? Target Date 04/17/22   ?  ? PT LONG TERM GOAL #2  ? Title Improve mini BEST test from 18/28 to > or equal to 22/28.   ? Time 8   ? Period Weeks   ? Target Date 04/17/22   ?  ? PT LONG TERM GOAL #3  ? Title The patient will demonstrate more normalized gait pattern with reciprocal arm swing and L knee flexion with gait.   ? Time 8   ? Period Weeks   ? Target Date 04/17/22   ?  ? PT LONG TERM GOAL #4  ? Title The patient will negotiate steps with reciprocal pattern and one HR.   ? Time 8   ? Period Weeks   ? Target Date 04/17/22   ? ?  ?  ? ?  ? ? ? ? ? ? ? ? Plan - 03/16/22 1317   ? ? Clinical Impression Statement Skilled PT session today focused on large amplitude movement pattterns for improved timing and coordination of LLE with gait.  PT provided varied cues to assist with larger amplitude, increased intensity of LLE movement;  added to HEP for improved LLE foot clearance/heelstrike.  Pt reports feeling good at end of session and feels successful at end of session.  Will continue to benefit from skilled PT towards goals for improved overall timing and coordination with functional mobility.   ? PT Treatment/Interventions ADLs/Self Care Home Management;Patient/family education;Gait training;Stair training;Functional mobility training;Therapeutic activities;Therapeutic exercise;Neuromuscular re-education;Balance training;Manual techniques   ? PT Next Visit Plan continue progressing large amplitude/PWR! standing activities near support surface for home, backwards walking, balance strategies, UE reciprocal movement during gait.  REview addition to HEP.  Add more dynamic gait activities of head motion, obstacles and turns.  Check STGS!   ? Consulted and Agree with Plan of Care Patient   ? ?  ?  ? ?  ? ? ?Patient will benefit from skilled therapeutic intervention in order to improve the following deficits and impairments:  Abnormal gait, Difficulty walking, Decreased strength, Postural dysfunction, Decreased balance ? ?Visit Diagnosis: ?Unsteadiness on feet ? ?Other abnormalities of gait and mobility ? ?Muscle weakness (generalized) ? ? ? ? ?Problem List ?Patient Active Problem List  ? Diagnosis Date Noted  ? Parkinson's disease ([place]) 02/04/2022  ? Hypotension   ? Elevated troponin   ? Nausea   ? Nausea and vomiting   ? Septic shock ([place]) 06/05/2020  ? AKI (acute kidney injury) ([place])   ? Metabolic acidosis, increased anion gap   ? Dyspnea   ? Agatston coronary artery calcium score between 100 and 199   ? Murmur 11/11/2016  ? 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 ? ?Rhonda Linan W., PT ?03/16/2022, 1:19 PM ? ?[place] ?[facility] ?[address], [address] ?[place], [place], [address] ?Phone: [phone]   Fax:  [phone] ? ?Name: TERREE GAULTNEY ?MRN: [id number] ?Date of Birth: [birth year] ? ? ? ?

## 2022-03-18 ENCOUNTER — Ambulatory Visit: Payer: Medicare PPO | Admitting: Physical Therapy

## 2022-03-23 ENCOUNTER — Ambulatory Visit: Payer: Medicare PPO | Admitting: Occupational Therapy

## 2022-03-23 ENCOUNTER — Encounter: Payer: Self-pay | Admitting: Physical Therapy

## 2022-03-23 ENCOUNTER — Encounter: Payer: Self-pay | Admitting: Occupational Therapy

## 2022-03-23 ENCOUNTER — Ambulatory Visit: Payer: Medicare PPO | Admitting: Physical Therapy

## 2022-03-23 DIAGNOSIS — R2681 Unsteadiness on feet: Secondary | ICD-10-CM

## 2022-03-23 DIAGNOSIS — R278 Other lack of coordination: Secondary | ICD-10-CM | POA: Diagnosis not present

## 2022-03-23 DIAGNOSIS — R293 Abnormal posture: Secondary | ICD-10-CM

## 2022-03-23 DIAGNOSIS — R29818 Other symptoms and signs involving the nervous system: Secondary | ICD-10-CM

## 2022-03-23 DIAGNOSIS — M6281 Muscle weakness (generalized): Secondary | ICD-10-CM | POA: Diagnosis not present

## 2022-03-23 DIAGNOSIS — R2689 Other abnormalities of gait and mobility: Secondary | ICD-10-CM

## 2022-03-23 NOTE — Therapy (Signed)
[place] ?[facility] ?[address], [address] ?[place], [place], [address] ?Phone: [phone]   Fax:  [phone] ? ?Physical Therapy Treatment ? ?Patient Details  ?Name: [place] Wallace ?MRN: [id number] ?Date of Birth: 05/02/1941 ?Referring Provider (PT): Alonza Bogus, DO ? ? ?Encounter Date: 03/23/2022 ? ? PT End of Session - 03/23/22 1232   ? ? Visit Number 7   ? Number of Visits 16   ? Date for PT Re-Evaluation 04/17/22   ? Authorization Type [employer] Medicare   ? PT Start Time 1232   ? PT Stop Time 1316   ? PT Time Calculation (min) 44 min   ? Equipment Utilized During Treatment --   ? Activity Tolerance Patient tolerated treatment well   ? Behavior During Therapy [facility] for tasks assessed/performed   ? ?  ?  ? ?  ? ? ?Past Medical History:  ?Diagnosis Date  ? Colon polyp   ? High cholesterol   ? Mood disorder ([place])   ? ? ?Past Surgical History:  ?Procedure Laterality Date  ? COLECTOMY    ? EUS N/A 11/29/2015  ? Procedure: LOWER ENDOSCOPIC ULTRASOUND (EUS);  Surgeon: Arta Silence, MD;  Location: [facility] ENDOSCOPY;  Service: Endoscopy;  Laterality: N/A;  ? FLEXIBLE SIGMOIDOSCOPY N/A 01/27/2018  ? Procedure: FLEXIBLE SIGMOIDOSCOPY;  Surgeon: Otis Brace, MD;  Location: [place] ENDOSCOPY;  Service: Gastroenterology;  Laterality: N/A;  ? LEFT HEART CATH AND CORONARY ANGIOGRAPHY N/A 11/15/2018  ? Procedure: LEFT HEART CATH AND CORONARY ANGIOGRAPHY;  Surgeon: Troy Sine, MD;  Location: [place] CV LAB;  Service: Cardiovascular;  Laterality: N/A;  ? POLYPECTOMY  02/19/2016  ? ? ?There were no vitals filed for this visit. ? ? Subjective Assessment - 03/23/22 1232   ? ? Subjective No new changes.   ? Pertinent History hypercholesterolemia   ? Patient Stated Goals "To walk perfect"   ? Pain Onset More than a month ago   ? ?  ?  ? ?  ? ? ? ? ? ? ? ? ? ? ? ? ? ? ? ? ? ? ? ? [place] Adult PT Treatment/Exercise - 03/23/22 0001   ? ?  ? Ambulation/Gait  ? Ambulation/Gait Yes   ? Ambulation/Gait  Assistance 5: Supervision   ? Ambulation Distance (Feet) 340 Feet   ? Assistive device None   ? Gait Pattern Step-through pattern;Decreased arm swing - right;Decreased arm swing - left;Decreased step length - right;Decreased step length - left;Decreased stride length   ? Gait Comments Gait for warm up with noted improved arm swing with gait.   ?  ? Standardized Balance Assessment  ? Standardized Balance Assessment Timed Up and Go Test   ?  ? Timed Up and Go Test  ? TUG Normal TUG;Cognitive TUG   ? Normal TUG (seconds) 12.38   ? Cognitive TUG (seconds) 16.06   ?  ? Exercises  ? Exercises Knee/Hip   ?  ? Knee/Hip Exercises: Aerobic  ? Nustep Level 3, 4 extremities x 5 minutes, keeping steps/minute > 65-70 for aerobic activity   ? ?  ?  ? ?  ? ?Access Code: [email or web address] ?URL: [email or web address] ?Date: 03/23/2022 ?Prepared by: [facility] ? ?Exercises-Reviewed HEP and pt needs reminder cues for technique.  She does report not doing all exercises daily ? ?- Sit to Stand with Arm Swing  - 2 x daily - 7 x weekly - 1 sets - 10 reps ? -  cues for upright posture, hold 3 sec upon standing ?- Step Sideways with Arms Reaching  - 2 x daily - 7 x weekly - 2 sets - 10 reps ? -cues for high step and weightshift, then challenge with alternating feet ?- Side to Side Weight Shift with Overhead Reach and Counter Support  - 1-2 x daily - 7 x weekly - 2 sets - 10 reps ? -Cues to try near counter, just not touching cabinet, for improved posture  ?- Standing Reach to Opposite Side with Weight Shift  - 1-2 x daily - 7 x weekly - 2 sets - 10 reps ? -cues for correct technique (T-position (open)-CLAP-open-CLAP) ?- Step Forward with Arms Reaching to Sides  - 2 x daily - 7 x weekly - 1 sets - 10 reps ?- Staggered Stance Weight Shift with Arms Reaching  - 2 x daily - 7 x weekly - 1 sets - 10 reps ?- Forward Step Over with Counter Support  - 1 x daily - 5 x weekly - 1-2 sets - 10  reps ? ? ? ? ? ? ? ? ? ? PT Education - 03/23/22 1432   ? ? Education Details Reviewed HEP-pt requires instructions/cues throughout.  Provided handouts with updated instructions; chart to encourage exercise compliance.   ? Person(s) Educated Patient   ? Methods Explanation;Verbal cues;Demonstration;Handout   ? Comprehension Verbalized understanding;Returned demonstration   ? ?  ?  ? ?  ? ? ? PT Short Term Goals - 03/23/22 1236   ? ?  ? PT SHORT TERM GOAL #1  ? Title The patient will be indep with initial HEP.   ? Time 4   ? Period Weeks   ? Status On-going   ? Target Date 03/18/22   ?  ? PT SHORT TERM GOAL #2  ? Title The patient will improve TUG cognitive to < or equal to 13 seconds to demo improved multi-tasking and mobility.   ? Baseline 16.06 sec 03/23/2022   ? Time 4   ? Period Weeks   ? Status Not Met   ? Target Date 03/18/22   ?  ? PT SHORT TERM GOAL #3  ? Title The patient will improve gait speed from 2.33 ft/sec to > or equal to 2.8 ft/sec demonstrating improved functional mobility.   ? Baseline 3.28 ft/sec 03/23/2022   ? Time 4   ? Period Weeks   ? Status Achieved   ? Target Date 03/18/22   ?  ? PT SHORT TERM GOAL #4  ? Title The patient will verbalize understanding of support group and community class offerings for [employer]!.   ? Time 4   ? Period Weeks   ? Status Achieved   ? Target Date 03/18/22   ? ?  ?  ? ?  ? ? ? ? PT Long Term Goals - 02/16/22 1314   ? ?  ? PT LONG TERM GOAL #1  ? Title The patient will be indep with HEP progression.   ? Time 8   ? Period Weeks   ? Target Date 04/17/22   ?  ? PT LONG TERM GOAL #2  ? Title Improve mini BEST test from 18/28 to > or equal to 22/28.   ? Time 8   ? Period Weeks   ? Target Date 04/17/22   ?  ? PT LONG TERM GOAL #3  ? Title The patient will demonstrate more normalized gait pattern with reciprocal arm swing and L knee  flexion with gait.   ? Time 8   ? Period Weeks   ? Target Date 04/17/22   ?  ? PT LONG TERM GOAL #4  ? Title The patient will negotiate steps with  reciprocal pattern and one HR.   ? Time 8   ? Period Weeks   ? Target Date 04/17/22   ? ?  ?  ? ?  ? ? ? ? ? ? ? ? Plan - 03/23/22 1434   ? ? Clinical Impression Statement Assessed STGs this visit, with pt meeting 2 of 4 STGs.  STG 1 ongoing for HEP independence, as pt needs cues for correct technique and reports not doing all exercises each day.  Provided cues and instruction as well as tracking chart to encourage compliance going forward.  STG 2 not met with TUG cognitive score similar to eval score.  STG 3 and 4 met for improved gait velocity and community fitness participation.  Pt demonstrated slightly slowed coordination and pace with LLE/LUE and continued slowed pace with gait with cognitive tasks.  She responds well to cues for larger amplitude, higher intensity movement patterns, especially for LLE.  She does report noting improved walking coordination with better L arm swing.   ? PT Treatment/Interventions ADLs/Self Care Home Management;Patient/family education;Gait training;Stair training;Functional mobility training;Therapeutic activities;Therapeutic exercise;Neuromuscular re-education;Balance training;Manual techniques   ? PT Next Visit Plan continue progressing large amplitude/PWR! standing activities near support surface for home, backwards walking, balance strategies, UE reciprocal movement during gait.  REview HEP as needed.  Add more dynamic gait activities of head motion, obstacles and turns.  Discuss d/c versus continue in POC.   ? Consulted and Agree with Plan of Care Patient   ? ?  ?  ? ?  ? ? ?Patient will benefit from skilled therapeutic intervention in order to improve the following deficits and impairments:  Abnormal gait, Difficulty walking, Decreased strength, Postural dysfunction, Decreased balance ? ?Visit Diagnosis: ?Other symptoms and signs involving the nervous system ? ?Unsteadiness on feet ? ?Other abnormalities of gait and mobility ? ? ? ? ?Problem List ?Patient Active Problem  List  ? Diagnosis Date Noted  ? Parkinson's disease ([place]) 02/04/2022  ? Hypotension   ? Elevated troponin   ? Nausea   ? Nausea and vomiting   ? Septic shock ([place]) 06/05/2020  ? AKI (acute kidney injury) ([place])   ? Metabolic acid

## 2022-03-23 NOTE — Therapy (Signed)
[place] ?[facility] ?[address], [address] ?[place], [place], [address] ?Phone: [phone]   Fax:  [phone] ? ?Occupational Therapy Treatment ? ?Patient Details  ?Name: [place] Wallace ?MRN: [id number] ?Date of Birth: Jun 30, 1941 ?Referring Provider (OT): Tat, Eustace Quail, DO ? ? ?Encounter Date: 03/23/2022 ? ? OT End of Session - 03/23/22 1400   ? ? Visit Number 6   ? Number of Visits 9   ? Date for OT Re-Evaluation 04/17/22   ? Authorization Type [employer] Medicare   ? OT Start Time 1318   ? OT Stop Time 1400   ? OT Time Calculation (min) 42 min   ? Activity Tolerance Patient tolerated treatment well   ? Behavior During Therapy [facility] for tasks assessed/performed   ? ?  ?  ? ?  ? ? ?Past Medical History:  ?Diagnosis Date  ? Colon polyp   ? High cholesterol   ? Mood disorder ([place])   ? ? ?Past Surgical History:  ?Procedure Laterality Date  ? COLECTOMY    ? EUS N/A 11/29/2015  ? Procedure: LOWER ENDOSCOPIC ULTRASOUND (EUS);  Surgeon: Arta Silence, MD;  Location: [facility] ENDOSCOPY;  Service: Endoscopy;  Laterality: N/A;  ? FLEXIBLE SIGMOIDOSCOPY N/A 01/27/2018  ? Procedure: FLEXIBLE SIGMOIDOSCOPY;  Surgeon: Otis Brace, MD;  Location: [place] ENDOSCOPY;  Service: Gastroenterology;  Laterality: N/A;  ? LEFT HEART CATH AND CORONARY ANGIOGRAPHY N/A 11/15/2018  ? Procedure: LEFT HEART CATH AND CORONARY ANGIOGRAPHY;  Surgeon: Troy Sine, MD;  Location: [place] CV LAB;  Service: Cardiovascular;  Laterality: N/A;  ? POLYPECTOMY  02/19/2016  ? ? ?There were no vitals filed for this visit. ? ? Subjective Assessment - 03/23/22 1319   ? ? Subjective  "Sometimes I get an idea of what I want to do, but before I can do it I lose the thought/idea"   ? Pertinent History high cholesterol, mood disorder   ? Patient Stated Goals To walk better and get stronger   ? Currently in Pain? No/denies   ? Pain Onset More than a month ago   ? ?  ?  ? ?  ? ? ?ADL: Reports trying jacket at home with cape technique  and noted improvement.  Pleased with handwriting improvements. ? ?Educated on use of physical calendar or schedule, calendar reminders in phone, or alarm to remind self to complete exercises as pt reports that she does not perform them as often as she should.  Provided pt with a log to use with her exercises as an external reminder. ? ?Large amplitude movements: engaged in seated forearm extension with large amplitude finger/wrist/elbow extension, standing overhead extension, sidestepping with forearm extension. Therapist providing Cues for large amplitude movement and use of target for consistent step length and elbow extension.  Noted mild decrease in shoulder flexion on L, therefore encouraged continued engagement in overhead extension and educated on functional carryover or functional tasks to attempt to increase mobility. ? ?Engaged in reaching in cabinet with full open hand and reaching to place on table with open arm movement.  Noted increased difficulty on L > R.  Discussed tightness and mild decrease in L shoulder ROM.  Reiterated overhead exercise.   ? ? ? ? ? ? ? ? ? ? ? ? ? ? ? ? ? ? ? ? ? ? OT Short Term Goals - 03/16/22 1324   ? ?  ? OT SHORT TERM GOAL #1  ? Title Pt will be independent with  PD specific HEP   ? Time 4   ? Period Weeks   ? Status Achieved   ? Target Date 03/20/22   ?  ? OT SHORT TERM GOAL #2  ? Title Pt will verbalize understanding of adapted strategies to maximize safety and I with ADLs/ IADLs.   ? Time 4   ? Period Weeks   ? Status Achieved   ?  ? OT SHORT TERM GOAL #3  ? Title Pt will demonstrate improved UE functional use for ADLs as evidenced by increasing box/ blocks score by 2 blocks with LUE.   R: 53 and L: 53  ? Baseline R: 48 and L: 46 (pt bumping into partition 25% of time on L)   ? Time 4   ? Period Weeks   ? Status Achieved   ? ?  ?  ? ?  ? ? ? ? OT Long Term Goals - 03/23/22 1346   ? ?  ? OT LONG TERM GOAL #1  ? Title Pt will write a short paragraph with 100% legibility  and no significant decrease in letter size   ? Time 8   ? Period Weeks   ? Status Achieved   ? Target Date 04/17/22   ?  ? OT LONG TERM GOAL #2  ? Title Pt will demonstrate improved ease with fastening buttons as evidenced by decreasing 3 button/ unbutton time to 21 seconds or less.   ? Baseline 24.5   19.75 seconds on 03/23/22  ? Time 8   ? Period Weeks   ? Status Achieved   ?  ? OT LONG TERM GOAL #3  ? Title Pt will demonstrate ability to retrieve a lightweight object at 150 shoulder flexion with LUE   ? Baseline R: 150, L: 140   ? Time 8   ? Period Weeks   ? Status On-going   ?  ? OT LONG TERM GOAL #4  ? Title Pt will demonstrate understanding of memory compensations and ways to keep thinking skills sharp   ? Time 8   ? Period Weeks   ? Status On-going   ?  ? OT LONG TERM GOAL #5  ? Title Pt will verbalize understanding of ways to prevent future PD related complications and PD community resources.   ? Time 8   ? Period Weeks   ? Status On-going   ? ?  ?  ? ?  ? ? ? ? ? ? ? ? ? ?Patient will benefit from skilled therapeutic intervention in order to improve the following deficits and impairments:   ?  ?  ?  ? ? ?Visit Diagnosis: ?Muscle weakness (generalized) ? ?Other lack of coordination ? ?Abnormal posture ? ?Other symptoms and signs involving the nervous system ? ?Unsteadiness on feet ? ?Other abnormalities of gait and mobility ? ? ? ?Problem List ?Patient Active Problem List  ? Diagnosis Date Noted  ? Parkinson's disease ([place]) 02/04/2022  ? Hypotension   ? Elevated troponin   ? Nausea   ? Nausea and vomiting   ? Septic shock ([place]) 06/05/2020  ? AKI (acute kidney injury) ([place])   ? Metabolic acidosis, increased anion gap   ? Dyspnea   ? Agatston coronary artery calcium score between 100 and 199   ? Murmur 11/11/2016  ? 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 ? [place] Wallace, [place] ?03/23/2022, 5:00 PM ? ?[place] ?[facility] ?[address], [address] ?[place], [place], [address] ?Phone:  [phone]   Fax:  [phone] ? ?Name: Alexandria Wallace ?MRN: [id number] ?Date of Birth: 1941-03-02 ? ?

## 2022-03-23 NOTE — Patient Instructions (Signed)
Access Code: [email or web address] ?URL: [email or web address] ?Date: 03/16/2022 ?Prepared by: [facility] ?  ?Exercises ?- Sit to Stand with Arm Swing  - 2 x daily - 7 x weekly - 1 sets - 10 reps ?- Step Sideways with Arms Reaching  - 2 x daily - 7 x weekly - 1 sets - 10 reps ?- Side to Side Weight Shift with Overhead Reach and Counter Support  - 1-2 x daily - 7 x weekly - 2 sets - 10 reps ?- Standing Reach to Opposite Side with Weight Shift  - 1-2 x daily - 7 x weekly - 2 sets - 10 reps ?- Step Forward with Arms Reaching to Sides  - 2 x daily - 7 x weekly - 1 sets - 10 reps ?- Staggered Stance Weight Shift with Arms Reaching  - 2 x daily - 7 x weekly - 1 sets - 10 reps ?- Forward Step Over with Counter Support  - 1 x daily - 5 x weekly - 1-2 sets - 10 reps ?

## 2022-03-30 ENCOUNTER — Encounter: Payer: Self-pay | Admitting: Physical Therapy

## 2022-03-30 ENCOUNTER — Other Ambulatory Visit: Payer: Self-pay | Admitting: Neurology

## 2022-03-30 ENCOUNTER — Encounter: Payer: Self-pay | Admitting: Occupational Therapy

## 2022-03-30 ENCOUNTER — Ambulatory Visit: Payer: Medicare PPO | Attending: Neurology | Admitting: Physical Therapy

## 2022-03-30 ENCOUNTER — Ambulatory Visit: Payer: Medicare PPO | Admitting: Occupational Therapy

## 2022-03-30 DIAGNOSIS — R471 Dysarthria and anarthria: Secondary | ICD-10-CM | POA: Insufficient documentation

## 2022-03-30 DIAGNOSIS — R2681 Unsteadiness on feet: Secondary | ICD-10-CM

## 2022-03-30 DIAGNOSIS — R278 Other lack of coordination: Secondary | ICD-10-CM

## 2022-03-30 DIAGNOSIS — G20 Parkinson's disease: Secondary | ICD-10-CM

## 2022-03-30 DIAGNOSIS — R2689 Other abnormalities of gait and mobility: Secondary | ICD-10-CM | POA: Diagnosis not present

## 2022-03-30 DIAGNOSIS — M6281 Muscle weakness (generalized): Secondary | ICD-10-CM | POA: Diagnosis not present

## 2022-03-30 DIAGNOSIS — R1310 Dysphagia, unspecified: Secondary | ICD-10-CM | POA: Insufficient documentation

## 2022-03-30 DIAGNOSIS — R29818 Other symptoms and signs involving the nervous system: Secondary | ICD-10-CM

## 2022-03-30 DIAGNOSIS — R41841 Cognitive communication deficit: Secondary | ICD-10-CM | POA: Diagnosis not present

## 2022-03-30 DIAGNOSIS — R4701 Aphasia: Secondary | ICD-10-CM | POA: Insufficient documentation

## 2022-03-30 NOTE — Patient Instructions (Signed)
Optimal Fitness Program after Therapy for People with Parkinson's Disease ? ?1)  Therapy Home Exercise Program ? -Do these Exercises DAILY as instructed by your therapist ? -Big, deliberate effort with exercises ? -These exercises are important to perform consistently, even when therapist has  finished, because these therapy exercises often address your specific  Parkinson's difficulties ? ? ?2)  Walking ? -  Work up to walking 3-5 times per week, 20-30 minutes per day ? -This can be done at home, driveway, quiet street or an indoor track ? -Focus should be on your Best posture, arm swing, step length for your best  walking pattern ? ?3)  Aerobic Exercise ? -Work up to 3-5 times per week, 30 minutes per day ? -This can be stationary bike, seated stepper machine, elliptical machine ? -Work up to 7-8/10 intensity during the exercise, at minimal to moderate    ? Resistance ?  ?   ?

## 2022-03-30 NOTE — Therapy (Signed)
[facility] [address] [place], [place], [address] Phone: [phone]   Fax:  [phone]  Occupational Therapy Treatment  Patient Details  Name: Alexandria Wallace MRN: [id number] Date of Birth: 09-Oct-1941 Referring Provider (OT): Tat, Octaviano Batty, DO   Encounter Date: 03/30/2022   OT End of Session - 03/30/22 1111     Visit Number 7    Number of Visits 9    Date for OT Re-Evaluation 04/17/22    Authorization Type [employer]    OT Start Time 1104    OT Stop Time 1145    OT Time Calculation (min) 41 min    Activity Tolerance Patient tolerated treatment well    Behavior During Therapy [facility] for tasks assessed/performed             Past Medical History:  Diagnosis Date   Colon polyp    High cholesterol    Mood disorder [facility])     Past Surgical History:  Procedure Laterality Date   COLECTOMY     EUS N/A 11/29/2015   Procedure: LOWER ENDOSCOPIC ULTRASOUND (EUS);  Surgeon: Willis Modena, MD;  Location: [facility] ENDOSCOPY;  Service: Endoscopy;  Laterality: N/A;   FLEXIBLE SIGMOIDOSCOPY N/A 01/27/2018   Procedure: FLEXIBLE SIGMOIDOSCOPY;  Surgeon: Kathi Der, MD;  Location: [facility] ENDOSCOPY;  Service: Gastroenterology;  Laterality: N/A;   LEFT HEART CATH AND CORONARY ANGIOGRAPHY N/A 11/15/2018   Procedure: LEFT HEART CATH AND CORONARY ANGIOGRAPHY;  Surgeon: Lennette Bihari, MD;  Location: [facility] INVASIVE CV LAB;  Service: Cardiovascular;  Laterality: N/A;   POLYPECTOMY  02/19/2016    There were no vitals filed for this visit.   Subjective Assessment - 03/30/22 1109     Subjective  "I went out to dinner with my daughter over the weekend"    Pertinent History high cholesterol, mood disorder    Patient Stated Goals To walk better and get stronger    Currently in Pain? No/denies    Pain Onset More than a month ago             [facility]: Engaged in peg board pattern replication with focus on fine motor coordination during  task.  Therapist increased challenge to incorporating in-hand manipulation and translation.  Pt demonstrating mild difficulty with translation, but only dropping one peg during task.  Pt with overall improvements in coordination since initial evaluation.    Large amplitude movements: Engaged in standing forearm extension with large amplitude finger/wrist/elbow extension, standing overhead extension, and sidestepping with forearm extension. Pt reports moderate pain in trunk/ribs after completing exercises and attributes that to this particular exercise.  Pt reports no pain during activity.  Educated on completing exercises 3x/week to allow for rest day in between especially with the activities that may cause pain.  Educated on functional reaching tasks to carry movement over to more functional tasks to decrease pain while improving functional reach with LUE.                          OT Short Term Goals - 03/16/22 1324       OT SHORT TERM GOAL #1   Title Pt will be independent with PD specific HEP    Time 4    Period Weeks    Status Achieved    Target Date 03/20/22      OT SHORT TERM GOAL #2   Title Pt will verbalize understanding of adapted strategies to maximize  safety and I with ADLs/ IADLs.    Time 4    Period Weeks    Status Achieved      OT SHORT TERM GOAL #3   Title Pt will demonstrate improved UE functional use for ADLs as evidenced by increasing box/ blocks score by 2 blocks with LUE.   R: 53 and L: 53   Baseline R: 48 and L: 46 (pt bumping into partition 25% of time on L)    Time 4    Period Weeks    Status Achieved               OT Long Term Goals - 03/23/22 1346       OT LONG TERM GOAL #1   Title Pt will write a short paragraph with 100% legibility and no significant decrease in letter size    Time 8    Period Weeks    Status Achieved    Target Date 04/17/22      OT LONG TERM GOAL #2   Title Pt will demonstrate improved ease with fastening  buttons as evidenced by decreasing 3 button/ unbutton time to 21 seconds or less.    Baseline 24.5   19.75 seconds on 03/23/22   Time 8    Period Weeks    Status Achieved      OT LONG TERM GOAL #3   Title Pt will demonstrate ability to retrieve a lightweight object at 150 shoulder flexion with LUE    Baseline R: 150, L: 140    Time 8    Period Weeks    Status On-going      OT LONG TERM GOAL #4   Title Pt will demonstrate understanding of memory compensations and ways to keep thinking skills sharp    Time 8    Period Weeks    Status On-going      OT LONG TERM GOAL #5   Title Pt will verbalize understanding of ways to prevent future PD related complications and PD community resources.    Time 8    Period Weeks    Status On-going                   Plan - 03/30/22 1111     Clinical Impression Statement Pt continues to verbalize concerns with her working memory as she feels that she is forgetting things.  Therapist also noting some challenges with recall of exercises and increased challenge with dual tasking. Pt reports noting improved completion of her HEP when given external aid (grid to check off exercises).  Pt pleased with functional progress, however increased awareness of cognitive concerns now that she is moving better.    OT Occupational Profile and History Detailed Assessment- Review of Records and additional review of physical, cognitive, psychosocial history related to current functional performance    Occupational performance deficits (Please refer to evaluation for details): ADL's;IADL's    Body Structure / Function / Physical Skills ADL;Balance;Body mechanics;Coordination;Decreased knowledge of precautions;Decreased knowledge of use of DME;Dexterity;Endurance;Flexibility;FMC;GMC;IADL;Mobility;ROM;Pain;Strength;UE functional use;Tone    Cognitive Skills Memory    Rehab Potential Good    Clinical Decision Making Limited treatment options, no task modification  necessary    Comorbidities Affecting Occupational Performance: May have comorbidities impacting occupational performance    Modification or Assistance to Complete Evaluation  No modification of tasks or assist necessary to complete eval    OT Frequency 1x / week    OT Duration 8 weeks    OT Treatment/Interventions  Self-care/ADL training;Moist Heat;Ultrasound;Therapeutic exercise;Energy conservation;Neuromuscular education;DME and/or AE instruction;Functional Mobility Training;Manual Therapy;Therapeutic activities;Cognitive remediation/compensation;Patient/family education;Balance training    Plan sustained and selective attention during structured and functional task (pt states she forgets ideas and thoughts before she is able to act on them), reiterate large amplitude movements, dynamic standing activities to challenge functional reach and cognitive dual tasking    OT Home Exercise Plan [facility]    Consulted and Agree with Plan of Care Patient             Patient will benefit from skilled therapeutic intervention in order to improve the following deficits and impairments:   Body Structure / Function / Physical Skills: ADL, Balance, Body mechanics, Coordination, Decreased knowledge of precautions, Decreased knowledge of use of DME, Dexterity, Endurance, Flexibility, FMC, GMC, IADL, Mobility, ROM, Pain, Strength, UE functional use, Tone Cognitive Skills: Memory     Visit Diagnosis: Other symptoms and signs involving the nervous system  Unsteadiness on feet  Muscle weakness (generalized)  Other lack of coordination    Problem List Patient Active Problem List   Diagnosis Date Noted   Parkinson's disease (HCC) 02/04/2022   Hypotension    Elevated troponin    Nausea    Nausea and vomiting    Septic shock (HCC) 06/05/2020   AKI (acute kidney injury) (HCC)    Metabolic acidosis, increased anion gap    Dyspnea    Agatston coronary artery calcium score between 100 and 199     Murmur 11/11/2016   Mixed hyperlipidemia 11/11/2016    Rosalio Loud, OT 03/30/2022, 4:43 PM  [facility] [address] [place], [place], [address] Phone: [phone]   Fax:  [phone]  Name: KAYIA PONTI MRN: [id number] Date of Birth: [birth year]

## 2022-03-30 NOTE — Progress Notes (Signed)
Dr. Carles Collet,  ?Malori Myers has been being seen by OT for PD.  Recently she has been mentioning some concerns with her thought processes and memory.  She also demonstrates some decreased working memory and increased difficulty with dual tasking during therapy sessions.  The patient would benefit from SLP evaluation for cognition and memory.    ? ?If you agree, please place an order in [facility] in [facility] or fax the order to [phone].  ?Thank you,  ?Simonne Come, MS, OTR/L  ? ?[facility]  ?

## 2022-03-30 NOTE — Therapy (Signed)
[facility] ?[address], [address] ?[place], [place], [address] ?Phone: [phone]   Fax:  [phone] ? ?Physical Therapy Treatment ? ?Patient Details  ?Name: Alexandria Wallace ?MRN: [id number] ?Date of Birth: [birth year] ?Referring Provider (PT): Alonza Bogus, DO ? ? ?Encounter Date: 03/30/2022 ? ? PT End of Session - 03/30/22 1153   ? ? Visit Number 8   ? Number of Visits 16   ? Date for PT Re-Evaluation 04/17/22   ? Authorization Type [employer]   ? PT Start Time 1150   ? PT Stop Time 4166   ? PT Time Calculation (min) 39 min   ? Activity Tolerance Patient tolerated treatment well   ? Behavior During Therapy [facility] for tasks assessed/performed   ? ?  ?  ? ?  ? ? ?Past Medical History:  ?Diagnosis Date  ? Colon polyp   ? High cholesterol   ? Mood disorder ([place])   ? ? ?Past Surgical History:  ?Procedure Laterality Date  ? COLECTOMY    ? EUS N/A 11/29/2015  ? Procedure: LOWER ENDOSCOPIC ULTRASOUND (EUS);  Surgeon: Arta Silence, MD;  Location[facility] ENDOSCOPY;  Service: Endoscopy;  Laterality: N/A;  ? FLEXIBLE SIGMOIDOSCOPY N/A 01/27/2018  ? Procedure: FLEXIBLE SIGMOIDOSCOPY;  Surgeon: Otis Brace, MD;  Location: [facility] ENDOSCOPY;  Service: Gastroenterology;  Laterality: N/A;  ? LEFT HEART CATH AND CORONARY ANGIOGRAPHY N/A 11/15/2018  ? Procedure: LEFT HEART CATH AND CORONARY ANGIOGRAPHY;  Surgeon: Troy Sine, MD;  Location: [facility] CV LAB;  Service: Cardiovascular;  Laterality: N/A;  ? POLYPECTOMY  02/19/2016  ? ? ?There were no vitals filed for this visit. ? ? Subjective Assessment - 03/30/22 1152   ? ? Subjective Nothing new.  Did the exercise class last week.   ? Patient Stated Goals "To walk perfect"   ? Currently in Pain? No/denies   ? ?  ?  ? ?  ? ? ? ? ? ? ? ? ? ? ? ? ? ? ? ? ? ? ? ? [place] Adult PT Treatment/Exercise - 03/30/22 0001   ? ?  ? Ambulation/Gait  ? Ambulation/Gait Yes   ? Ambulation/Gait Assistance 5: Supervision   ? Ambulation Distance (Feet) 480  Feet   ? Assistive device None   ? Gait Pattern Step-through pattern;Decreased arm swing - right;Decreased arm swing - left;Decreased step length - right;Decreased step length - left;Decreased stride length   ? Gait Comments Gait with cues for arm swing, posture, step length.  Progressed to gait with head turns, looking at targets, head nods looking at targets.  With gait and head turns, pt initially veers side to side, then does better with finding visual targets.   ?  ? Therapeutic Activites   ? Therapeutic Activities Other Therapeutic Activities   ? Other Therapeutic Activities Discussed optimal fitness program-currently she does chair yoga, cycling class at [facility] [place]! Moves class once/week; walks almost daily; HEP 3x/wk   ?  ? Neuro Re-ed   ? Neuro Re-ed Details  Four-square step activity, forward>side>back>side over hurdles, x 4 reps, then additional 2 reps with added cognitive dual task (performs slower and more difficulty with backwards step)   ? ?  ?  ? ?  ? ? ? ?Access Code: [email or web address] ?URL: [email or web address] ?Date: 03/30/2022 ?Prepared by: [facility] ? ?Exercises-Performed today as review from last visit; pt reports being more consistent with HEP performance.  Minimal cues provided  today for stagger stance weightshifting exercise with coordinated arm swing. ?- Sit to Stand with Arm Swing  - 2 x daily - 7 x weekly - 1 sets - 10 reps ?- Step Sideways with Arms Reaching  - 2 x daily - 7 x weekly - 2 sets - 10 reps ?- Side to Side Weight Shift with Overhead Reach and Counter Support  - 1-2 x daily - 7 x weekly - 2 sets - 10 reps ?- Standing Reach to Opposite Side with Weight Shift  - 1-2 x daily - 7 x weekly - 2 sets - 10 reps ?- Step Forward with Arms Reaching to Sides  - 2 x daily - 7 x weekly - 1 sets - 10 reps ?- Staggered Stance Weight Shift with Arms Reaching  - 2 x daily - 7 x weekly - 1 sets - 10 reps ?- Forward Step Over with Counter Support  - 1 x  daily - 5 x weekly - 1-2 sets - 10 reps ? ? ?Educated pt how she can count ex out loud, backwards counting, naming items for dual tasking with activities. ? ? ? ? ? ? ? PT Education - 03/30/22 1216   ? ? Education Details Discussed optimal PD fitness upon d/c from PT   ? Person(s) Educated Patient   ? Methods Explanation;Handout   ? Comprehension Verbalized understanding   ? ?  ?  ? ?  ? ? ? PT Short Term Goals - 03/23/22 1236   ? ?  ? PT SHORT TERM GOAL #1  ? Title The patient will be indep with initial HEP.   ? Time 4   ? Period Weeks   ? Status On-going   ? Target Date 03/18/22   ?  ? PT SHORT TERM GOAL #2  ? Title The patient will improve TUG cognitive to < or equal to 13 seconds to demo improved multi-tasking and mobility.   ? Baseline 16.06 sec 03/23/2022   ? Time 4   ? Period Weeks   ? Status Not Met   ? Target Date 03/18/22   ?  ? PT SHORT TERM GOAL #3  ? Title The patient will improve gait speed from 2.33 ft/sec to > or equal to 2.8 ft/sec demonstrating improved functional mobility.   ? Baseline 3.28 ft/sec 03/23/2022   ? Time 4   ? Period Weeks   ? Status Achieved   ? Target Date 03/18/22   ?  ? PT SHORT TERM GOAL #4  ? Title The patient will verbalize understanding of support group and community class offerings for [employer]!.   ? Time 4   ? Period Weeks   ? Status Achieved   ? Target Date 03/18/22   ? ?  ?  ? ?  ? ? ? ? PT Long Term Goals - 02/16/22 1314   ? ?  ? PT LONG TERM GOAL #1  ? Title The patient will be indep with HEP progression.   ? Time 8   ? Period Weeks   ? Target Date 04/17/22   ?  ? PT LONG TERM GOAL #2  ? Title Improve mini BEST test from 18/28 to > or equal to 22/28.   ? Time 8   ? Period Weeks   ? Target Date 04/17/22   ?  ? PT LONG TERM GOAL #3  ? Title The patient will demonstrate more normalized gait pattern with reciprocal arm swing and L knee flexion with gait.   ?  Time 8   ? Period Weeks   ? Target Date 04/17/22   ?  ? PT LONG TERM GOAL #4  ? Title The patient will negotiate steps with  reciprocal pattern and one HR.   ? Time 8   ? Period Weeks   ? Target Date 04/17/22   ? ?  ?  ? ?  ? ? ? ? ? ? ? ? Plan - 03/30/22 1240   ? ? Clinical Impression Statement Skilled PT session today focused on large amplitude exercises in standing and with gait.  Pt improved with HEP performance over last visit, with pt noting performing more consistently at home.  Pt with slightly slower movement patterns with dual tasking (head motions with gait and cognitive tasks with standing).  Pt will continue to benefit from skilled PT towards improvement in funcitonal mobility and balance.   ? PT Treatment/Interventions ADLs/Self Care Home Management;Patient/family education;Gait training;Stair training;Functional mobility training;Therapeutic activities;Therapeutic exercise;Neuromuscular re-education;Balance training;Manual techniques   ? PT Next Visit Plan continue progressing large amplitude/PWR! standing activities near support surface for home, backwards walking, balance strategies, UE reciprocal movement during gait.  Add more dynamic gait activities of head motion, obstacles and turns.  Dual tasking with balance exercises   ? Consulted and Agree with Plan of Care Patient   ? ?  ?  ? ?  ? ? ?Patient will benefit from skilled therapeutic intervention in order to improve the following deficits and impairments:  Abnormal gait, Difficulty walking, Decreased strength, Postural dysfunction, Decreased balance ? ?Visit Diagnosis: ?Other symptoms and signs involving the nervous system ? ?Unsteadiness on feet ? ?Other abnormalities of gait and mobility ? ?Muscle weakness (generalized) ? ? ? ? ?Problem List ?Patient Active Problem List  ? Diagnosis Date Noted  ? Parkinson's disease ([place]) 02/04/2022  ? Hypotension   ? Elevated troponin   ? Nausea   ? Nausea and vomiting   ? Septic shock ([place]) 06/05/2020  ? AKI (acute kidney injury) ([place])   ? Metabolic acidosis, increased anion gap   ? Dyspnea   ? Agatston coronary artery calcium  score between 100 and 199   ? Murmur 11/11/2016  ? 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 ? ?Matea Stanard W., PT ?03/30/2022, 12:43 PM ? ?[place] [facility] ?[place], [place]

## 2022-04-07 ENCOUNTER — Ambulatory Visit: Payer: Medicare PPO

## 2022-04-07 DIAGNOSIS — R1310 Dysphagia, unspecified: Secondary | ICD-10-CM | POA: Diagnosis not present

## 2022-04-07 DIAGNOSIS — R278 Other lack of coordination: Secondary | ICD-10-CM | POA: Diagnosis not present

## 2022-04-07 DIAGNOSIS — R41841 Cognitive communication deficit: Secondary | ICD-10-CM

## 2022-04-07 DIAGNOSIS — R4701 Aphasia: Secondary | ICD-10-CM

## 2022-04-07 DIAGNOSIS — R29818 Other symptoms and signs involving the nervous system: Secondary | ICD-10-CM | POA: Diagnosis not present

## 2022-04-07 DIAGNOSIS — R2689 Other abnormalities of gait and mobility: Secondary | ICD-10-CM | POA: Diagnosis not present

## 2022-04-07 DIAGNOSIS — R2681 Unsteadiness on feet: Secondary | ICD-10-CM | POA: Diagnosis not present

## 2022-04-07 DIAGNOSIS — M6281 Muscle weakness (generalized): Secondary | ICD-10-CM | POA: Diagnosis not present

## 2022-04-07 DIAGNOSIS — R471 Dysarthria and anarthria: Secondary | ICD-10-CM | POA: Diagnosis not present

## 2022-04-07 NOTE — Therapy (Addendum)
?OUTPATIENT SPEECH LANGUAGE PATHOLOGY PARKINSON'S EVALUATION ? ? ?Patient Name: Alexandria Wallace ?MRN: [id number] ?DOB:[birth year], 81 y.o., female ?Today's Date: 04/07/2022 ? ?PCP: Theadore Nan ?REFERRING PROVIDER: Alonza Bogus, DO ? ? End of Session - 04/07/22 1647   ? ? Visit Number 1   ? Number of Visits 13   ? Date for SLP Re-Evaluation 06/30/22   ? Authorization Type humana   ? SLP Start Time 1450   ? SLP Stop Time  1532   ? SLP Time Calculation (min) 42 min   ? Activity Tolerance Patient tolerated treatment well   ? ?  ?  ? ?  ? ? ?Past Medical History:  ?Diagnosis Date  ? Colon polyp   ? High cholesterol   ? Mood disorder ([place])   ? ?Past Surgical History:  ?Procedure Laterality Date  ? COLECTOMY    ? EUS N/A 11/29/2015  ? Procedure: LOWER ENDOSCOPIC ULTRASOUND (EUS);  Surgeon: Arta Silence, MD;  Location: [facility] ENDOSCOPY;  Service: Endoscopy;  Laterality: N/A;  ? FLEXIBLE SIGMOIDOSCOPY N/A 01/27/2018  ? Procedure: FLEXIBLE SIGMOIDOSCOPY;  Surgeon: Otis Brace, MD;  Location: [facility] ENDOSCOPY;  Service: Gastroenterology;  Laterality: N/A;  ? LEFT HEART CATH AND CORONARY ANGIOGRAPHY N/A 11/15/2018  ? Procedure: LEFT HEART CATH AND CORONARY ANGIOGRAPHY;  Surgeon: Troy Sine, MD;  Location: [facility];  Service: Cardiovascular;  Laterality: N/A;  ? POLYPECTOMY  02/19/2016  ? ?Patient Active Problem List  ? Diagnosis Date Noted  ? Parkinson's disease ([place]) 02/04/2022  ? Hypotension   ? Elevated troponin   ? Nausea   ? Nausea and vomiting   ? Septic shock ([place]) 06/05/2020  ? AKI (acute kidney injury) ([place])   ? Metabolic acidosis, increased anion gap   ? Dyspnea   ? Agatston coronary artery calcium score between 100 and 199   ? Murmur 11/11/2016  ? 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 ? ?ONSET DATE: 2019 (sx), March 2023 (dx) ? ?REFERRING DIAG: Parkinson's Disease ? ?THERAPY DIAG:  ?Cognitive communication deficit ? ?Aphasia ? ?Dysphagia, unspecified type ? ?SUBJECTIVE:  ? ?SUBJECTIVE STATEMENT: ?"I have a  good idea - like when I'm in the car. Then the thought escapes me and I can't think of it anymore." ?Pt accompanied by: self ? 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 Brain imaging (02/24/22): "No evidence of acute intracranial abnormality. Mild generalized cerebral atrophy." Pt was referred for PT/OT, and OT note from 03/30/22 states "Recently she has been mentioning some concerns with her thought processes and memory.  She also demonstrates some decreased working memory and increased difficulty with dual tasking during therapy sessions.The patient would benefit from SLP evaluation for cognition and memory." ST was ordered by Dr. Alfonso Patten. Tat.     ? ?PAIN:  ?Are you having pain? No ? ?FALLS: Has patient fallen in last 6 months?  Yes ? ?LIVING ENVIRONMENT: ?Lives with: lives alone ?Lives in: House/apartment ? ?PLOF:  ?Level of assistance: Independent with ADLs ?Employment: Retired Radio producer ? ?PATIENT GOALS Improve thinking and word finding. ? ?OBJECTIVE:  ? ?DIAGNOSTIC FINDINGS: Cognitive Linguistic Quick Test (CLQT) initiated and to be completed next session. ? ?COGNITION: ?Overall cognitive status: Impaired: Attention: Impaired: Selective, Alternating, Divided, Comment: Pt gives example of "s" statement above, which demonstrates decr'd selective attention.  and TBD following  completion of CLQT. ?Areas of impairment: Attention ? ?MOTOR SPEECH: ?Overall motor speech: Appears intact ?Respiration: thoracic breathing ?Phonation: normal ?Resonance: WFL ?Articulation: Appears intact ?Intelligibility: Intelligible ?Motor planning: Appears intact ? ?ORAL MOTOR ASSESSMENT:   ?Impaired: Lingual coordination with alternate motion task slightly impaired. Strength is WNL,  ROM is WNL. Labial coordination and ROM WFL/WNL ? ? ?OBJECTIVE ASSESSMENT: ? ?Measured when a sound level meter was placed 30 cm away from pt's masked mouth, 10 minutes of simple conversational speech was WNL today, at average low 70sdB (WNL= average 70-72dB) with range of 66-73dB. Overall speech intelligibility for this listener in a quiet environment was not affected, at approximately 100%.  ? ?Pt does not report difficulty with swallowing warranting further evaluation - pill dysphagia occasionally, will be improved by teaching pt compensatory strategies. ? ? ? ?PATIENT REPORTED OUTCOME MEASURES (PROM): ?To be completed during first therapy session ? ? ?TODAY'S TREATMENT:  ?(pt education, described below)  ? ? ?PATIENT EDUCATION: ?Education details: deficit areas, possible goals ?Person educated: Patient ?Education method: Explanation ?Education comprehension: verbalized understanding ? ? ?HOME EXERCISE PROGRAM: ?TBD in first 1-2 therapy sessions ? ? ? ? ?GOALS: ?Goals reviewed with patient? Yes ? ?SHORT TERM GOALS: Target date: 05/05/2022  ? ?Pt will complete CLQT in first session ?Baseline: ?Goal status: INITIAL ? ?2.  Pt will use 80% abdominal breathing at rest in 2 sessions ?Baseline:  ?Goal status: INITIAL ? ?3.  Pt will demo selective attention in mod busy environment for 8 minutes of functional mod complex tasks in 2 sessions ?Baseline:  ?Goal status: INITIAL ? ?4.  Pt will state 3 compensations for attention she could use ?Baseline:  ?Goal status: INITIAL ? ?5.  Pt will report successful pill clearance using compensations between 2 sessions ?Baseline:  ?Goal status: INITIAL ? ? ?LONG TERM GOALS: Target date: 06/30/2022  (pt to go on three week vacation so date was extended) ? ?Pt will use 80% abdominal breathing at rest in 2 sessions ?Baseline:  ?Goal status: INITIAL ? ?2.  Pt will demo selective attention in mod busy environment for 15 minutes of functional mod complex tasks in 3 sessions ?Baseline:  ?Goal  status: INITIAL ? ?3. Pt will state usage of 2 compensations for attention between 3 sessions ?Baseline:  ?Goal status: INITIAL ? ?4. Pt will complete tasks for anomia (VNEST, SFA) independently in 2 sessions ?Baseline:  ?Goal status: INITIAL ? ?5  Pt will score an improved PROM in the last session ?Baseline:  ?Goal status: INITIAL ? ? ? ?ASSESSMENT: ? ?CLINICAL IMPRESSION: ?Patient is a 81 y.o. female who was seen today for reported cognitive and aphasic symptoms - pt gave examples of each including losing train of thought while engaging in completing activities (selective attention) and forgetting names of streets. Pt also reports pill dysphagia. Pt would benefit from skilled ST targeting the goals above. ? ?OBJECTIVE IMPAIRMENTS  ?Objective impairments include attention, aphasia, and dysphagia. These impairments are limiting patient from household responsibilities, effectively communicating at home and in community, and safety when swallowing.Factors affecting potential to achieve goals and functional outcome are medical prognosis.. Patient will benefit from skilled SLP services to address above impairments and improve overall function. ? ?REHAB POTENTIAL: Excellent ? ?PLAN: ?SLP FREQUENCY: 2x/week for 4 weeks, then x1/week x4 weeks ? ?SLP DURATION: 12 weeks (pt to be on 3-week vacation in the middle of ST plan of care) ? ?PLANNED INTERVENTIONS: Aspiration precaution training, Language facilitation, Environmental controls, Cueing hierachy, Cognitive reorganization,  Internal/external aids, Functional tasks, SLP instruction and feedback, Compensatory strategies, Patient/family education, and HEP for loud /a/. ? ? ? ?Vanessa Kampf, CCC-SLP ?04/07/2022, 4:49 PM ? ? ? ?  ?

## 2022-04-10 ENCOUNTER — Ambulatory Visit: Payer: Medicare PPO | Admitting: Occupational Therapy

## 2022-04-10 ENCOUNTER — Ambulatory Visit: Payer: Medicare PPO | Admitting: Physical Therapy

## 2022-04-10 ENCOUNTER — Encounter: Payer: Self-pay | Admitting: Physical Therapy

## 2022-04-10 ENCOUNTER — Encounter: Payer: Self-pay | Admitting: Occupational Therapy

## 2022-04-10 DIAGNOSIS — M6281 Muscle weakness (generalized): Secondary | ICD-10-CM

## 2022-04-10 DIAGNOSIS — R2689 Other abnormalities of gait and mobility: Secondary | ICD-10-CM

## 2022-04-10 DIAGNOSIS — R1310 Dysphagia, unspecified: Secondary | ICD-10-CM | POA: Diagnosis not present

## 2022-04-10 DIAGNOSIS — R2681 Unsteadiness on feet: Secondary | ICD-10-CM

## 2022-04-10 DIAGNOSIS — R278 Other lack of coordination: Secondary | ICD-10-CM

## 2022-04-10 DIAGNOSIS — R29818 Other symptoms and signs involving the nervous system: Secondary | ICD-10-CM

## 2022-04-10 DIAGNOSIS — R4701 Aphasia: Secondary | ICD-10-CM | POA: Diagnosis not present

## 2022-04-10 DIAGNOSIS — R41841 Cognitive communication deficit: Secondary | ICD-10-CM | POA: Diagnosis not present

## 2022-04-10 DIAGNOSIS — R471 Dysarthria and anarthria: Secondary | ICD-10-CM | POA: Diagnosis not present

## 2022-04-10 NOTE — Therapy (Signed)
[place] ?[facility] ?[address], [address] ?[place], [place], [address] ?Phone: [phone]   Fax:  [phone] ? ?Occupational Therapy Treatment ? ?Patient Details  ?Name: Alexandria Wallace ?MRN: [id number] ?Date of Birth: 10-04-1941 ?Referring Provider (OT): Tat, Eustace Quail, DO ? ? ?Encounter Date: 04/10/2022 ? ? OT End of Session - 04/10/22 1017   ? ? Visit Number 8   ? Number of Visits 9   ? Date for OT Re-Evaluation 04/17/22   ? Authorization Type [employer] Medicare   ? OT Start Time 1016   ? OT Stop Time 1100   ? OT Time Calculation (min) 44 min   ? Activity Tolerance Patient tolerated treatment well   ? Behavior During Therapy [facility] for tasks assessed/performed   ? ?  ?  ? ?  ? ? ?Past Medical History:  ?Diagnosis Date  ? Colon polyp   ? High cholesterol   ? Mood disorder ([place])   ? ? ?Past Surgical History:  ?Procedure Laterality Date  ? COLECTOMY    ? EUS N/A 11/29/2015  ? Procedure: LOWER ENDOSCOPIC ULTRASOUND (EUS);  Surgeon: Arta Silence, MD;  Location: [facility] ENDOSCOPY;  Service: Endoscopy;  Laterality: N/A;  ? FLEXIBLE SIGMOIDOSCOPY N/A 01/27/2018  ? Procedure: FLEXIBLE SIGMOIDOSCOPY;  Surgeon: Otis Brace, MD;  Location: [facility] ENDOSCOPY;  Service: Gastroenterology;  Laterality: N/A;  ? LEFT HEART CATH AND CORONARY ANGIOGRAPHY N/A 11/15/2018  ? Procedure: LEFT HEART CATH AND CORONARY ANGIOGRAPHY;  Surgeon: Troy Sine, MD;  Location: [facility];  Service: Cardiovascular;  Laterality: N/A;  ? POLYPECTOMY  02/19/2016  ? ? ?There were no vitals filed for this visit. ? ? Subjective Assessment - 04/10/22 1018   ? ? Subjective  "We plan to go out to dinner for mother's day"   ? Pertinent History high cholesterol, mood disorder   ? Patient Stated Goals To walk better and get stronger   ? Currently in Pain? No/denies   ? Pain Onset More than a month ago   ? ?  ?  ? ?  ? ? ?[place]: Completed 3 button/unbutton task with consistent results and good technique.   ? - Engaged in small  fine motor tasks with picking up variety of small items and placing in container.  Therapist providing cues for technique to ensure picking up items and not sliding off table.  Increased challenge to in-hand manipulation and translation. Pt demonstrating mild increase in difficulty with in-hand manipulation.  Completed stacking and unstacking of coins with focus on coordination and motor control ? ?Memory strategies: Educated on various memory strategies and provided examples of each technique.  Pt verbalizing techniques that she is currently using as well as expounding upon examples.  Pt and therapist establishing some strategies to begin implementing and trialing to increase memory.  Provided pt with handout - see pt instructions. ? ?Large amplitude movements: picking up and transferring items from moderate height range to mid-low range with focus on large amplitude movement of shoulder and full hand grip and release.  Pt demonstrating sufficient grasp and release, making full contact with container to ensure improved grasp. ? ? ? ? ? ? ? ? ? ? ? ? ? ? ? ? ? ? ? ? ? OT Short Term Goals - 03/16/22 1324   ? ?  ? OT SHORT TERM GOAL #1  ? Title Pt will be independent with PD specific HEP   ? Time 4   ? Period Weeks   ?  Status Achieved   ? Target Date 03/20/22   ?  ? OT SHORT TERM GOAL #2  ? Title Pt will verbalize understanding of adapted strategies to maximize safety and I with ADLs/ IADLs.   ? Time 4   ? Period Weeks   ? Status Achieved   ?  ? OT SHORT TERM GOAL #3  ? Title Pt will demonstrate improved UE functional use for ADLs as evidenced by increasing box/ blocks score by 2 blocks with LUE.   R: 53 and L: 53  ? Baseline R: 48 and L: 46 (pt bumping into partition 25% of time on L)   ? Time 4   ? Period Weeks   ? Status Achieved   ? ?  ?  ? ?  ? ? ? ? OT Long Term Goals - 03/23/22 1346   ? ?  ? OT LONG TERM GOAL #1  ? Title Pt will write a short paragraph with 100% legibility and no significant decrease in letter  size   ? Time 8   ? Period Weeks   ? Status Achieved   ? Target Date 04/17/22   ?  ? OT LONG TERM GOAL #2  ? Title Pt will demonstrate improved ease with fastening buttons as evidenced by decreasing 3 button/ unbutton time to 21 seconds or less.   ? Baseline 24.5   19.75 seconds on 03/23/22  ? Time 8   ? Period Weeks   ? Status Achieved   ?  ? OT LONG TERM GOAL #3  ? Title Pt will demonstrate ability to retrieve a lightweight object at 150 shoulder flexion with LUE   ? Baseline R: 150, L: 140   ? Time 8   ? Period Weeks   ? Status On-going   ?  ? OT LONG TERM GOAL #4  ? Title Pt will demonstrate understanding of memory compensations and ways to keep thinking skills sharp   ? Time 8   ? Period Weeks   ? Status On-going   ?  ? OT LONG TERM GOAL #5  ? Title Pt will verbalize understanding of ways to prevent future PD related complications and PD community resources.   ? Time 8   ? Period Weeks   ? Status On-going   ? ?  ?  ? ?  ? ? ? ? ? ? ? ? Plan - 04/10/22 1018   ? ? Clinical Impression Statement Pt has begun speech therapy with focus on cognition and memory strategies.  Pt very receptive to memory strategies provided by OT this session.  Engaged in good discussion about current strategies and processes in place and expounded upon suggestions and ways in which pt can incorporate into her routine/home setup.  Pt demonstrating improved quality of movements with fine and gross motor tasks this session, still demonstrating occasional difficulty but able to correct as needed.   ? OT Occupational Profile and History Detailed Assessment- Review of Records and additional review of physical, cognitive, psychosocial history related to current functional performance   ? Occupational performance deficits (Please refer to evaluation for details): ADL's;IADL's   ? Body Structure / Function / Physical Skills ADL;Balance;Body mechanics;Coordination;Decreased knowledge of precautions;Decreased knowledge of use of  DME;Dexterity;Endurance;Flexibility;FMC;GMC;IADL;Mobility;ROM;Pain;Strength;UE functional use;Tone   ? Cognitive Skills Memory   ? Rehab Potential Good   ? Clinical Decision Making Limited treatment options, no task modification necessary   ? Comorbidities Affecting Occupational Performance: May have comorbidities impacting occupational performance   ?  Modification or Assistance to Complete Evaluation  No modification of tasks or assist necessary to complete eval   ? OT Frequency 1x / week   ? OT Duration 8 weeks   ? OT Treatment/Interventions Self-care/ADL training;Moist Heat;Ultrasound;Therapeutic exercise;Energy conservation;Neuromuscular education;DME and/or AE instruction;Functional Mobility Training;Manual Therapy;Therapeutic activities;Cognitive remediation/compensation;Patient/family education;Balance training   ? Plan sustained and selective attention during structured and functional task (pt states she forgets ideas and thoughts before she is able to act on them).  Review and check off goals.   ? [place]   ? Consulted and Agree with Plan of Care Patient   ? ?  ?  ? ?  ? ? ?Patient will benefit from skilled therapeutic intervention in order to improve the following deficits and impairments:   ?Body Structure / Function / Physical Skills: ADL, Balance, Body mechanics, Coordination, Decreased knowledge of precautions, Decreased knowledge of use of DME, Dexterity, Endurance, Flexibility, FMC, GMC, IADL, Mobility, ROM, Pain, Strength, UE functional use, Tone ?Cognitive Skills: Memory ?  ? ? ?Visit Diagnosis: ?Muscle weakness (generalized) ? ?Other lack of coordination ? ?Other symptoms and signs involving the nervous system ? ?Unsteadiness on feet ? ? ? ?Problem List ?Patient Active Problem List  ? Diagnosis Date Noted  ? Parkinson's disease ([place]) 02/04/2022  ? Hypotension   ? Elevated troponin   ? Nausea   ? Nausea and vomiting   ? Septic shock ([place]) 06/05/2020  ? AKI (acute kidney  injury) ([place])   ? Metabolic acidosis, increased anion gap   ? Dyspnea   ? Agatston coronary artery calcium score between 100 and 199   ? Murmur 11/11/2016  ? 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 ? Simonne Come, [place] ?04/10/2022,

## 2022-04-10 NOTE — Patient Instructions (Addendum)
Access Code: [email or web address] ?URL: [email or web address] ?Date: 04/10/2022 ?Prepared by: [facility] ? ?Exercises ?- Sit to Stand with Arm Swing  - 2 x daily - 7 x weekly - 1 sets - 10 reps ?- Step Sideways with Arms Reaching  - 2 x daily - 7 x weekly - 2 sets - 10 reps ?- Side to Side Weight Shift with Overhead Reach and Counter Support  - 1-2 x daily - 7 x weekly - 2 sets - 10 reps ?- Standing Reach to Opposite Side with Weight Shift  - 1-2 x daily - 7 x weekly - 2 sets - 10 reps ?- Step Forward with Arms Reaching to Sides  - 2 x daily - 7 x weekly - 1 sets - 10 reps ?- Staggered Stance Weight Shift with Arms Reaching  - 2 x daily - 7 x weekly - 1 sets - 10 reps ?- Forward Step Over with Counter Support  - 1 x daily - 5 x weekly - 1-2 sets - 10 reps ? ?Added 04/10/2022 ?- Seated Scapular Retraction  - 1 x daily - 5 x weekly - 1-2 sets - 10 reps - 3 sec hold ?- Standing Isometric Cervical Retraction with Chin Tucks and Ball at [employer]  - 1 x daily - 5 x weekly - 1-2 sets - 10 reps - 3 sec hold ?

## 2022-04-10 NOTE — Patient Instructions (Signed)

## 2022-04-10 NOTE — Therapy (Signed)
[place] ?[facility] ?[address] ?[place], [place], [address] ?Phone: [phone]   Fax:  [phone] ? ?Physical Therapy Treatment ? ?Patient Details  ?Name: Alexandria Wallace ?MRN: [id number] ?Date of Birth: 06-15-1941 ?Referring Provider (PT): Alonza Bogus, DO ? ? ?Encounter Date: 04/10/2022 ? ? PT End of Session - 04/10/22 0932   ? ? Visit Number 9   ? Number of Visits 16   ? Date for PT Re-Evaluation 04/17/22   ? Authorization Type [employer] Medicare   ? PT Start Time [phone]   ? PT Stop Time 1015   ? PT Time Calculation (min) 41 min   ? Activity Tolerance Patient tolerated treatment well   ? Behavior During Therapy [facility] for tasks assessed/performed   ? ?  ?  ? ?  ? ? ?Past Medical History:  ?Diagnosis Date  ? Colon polyp   ? High cholesterol   ? Mood disorder ([place])   ? ? ?Past Surgical History:  ?Procedure Laterality Date  ? COLECTOMY    ? EUS N/A 11/29/2015  ? Procedure: LOWER ENDOSCOPIC ULTRASOUND (EUS);  Surgeon: Arta Silence, MD;  Location: [facility] ENDOSCOPY;  Service: Endoscopy;  Laterality: N/A;  ? FLEXIBLE SIGMOIDOSCOPY N/A 01/27/2018  ? Procedure: FLEXIBLE SIGMOIDOSCOPY;  Surgeon: Otis Brace, MD;  Location: [place] [facility] ENDOSCOPY;  Service: Gastroenterology;  Laterality: N/A;  ? LEFT HEART CATH AND CORONARY ANGIOGRAPHY N/A 11/15/2018  ? Procedure: LEFT HEART CATH AND CORONARY ANGIOGRAPHY;  Surgeon: Troy Sine, MD;  Location: [place] [facility] LAB;  Service: Cardiovascular;  Laterality: N/A;  ? POLYPECTOMY  02/19/2016  ? ? ?There were no vitals filed for this visit. ? ? Subjective Assessment - 04/10/22 0932   ? ? Subjective I've got a problem for you to tackle.  I've caught myself several times with very forward lean.  I think it's when I'm tired.   ? Patient Stated Goals "To walk perfect"   ? Currently in Pain? No/denies   ? ?  ?  ? ?  ? ? ? ? ? ?Pt performs PWR! Moves standing position x 5 reps, at wall for tactile cues for posture ?  ?PWR! Up for improved posture ? ?PWR!  Rock for improved weighshifting ? ?PWR! Twist for improved trunk rotation  ? ? ? ? ? ? ? ? ? ? ? ? ? ? ? ? [facility] PT Treatment/Exercise - 04/10/22 0001   ? ?  ? Ambulation/Gait  ? Ambulation/Gait Yes   ? Ambulation/Gait Assistance 5: Supervision   ? Ambulation Distance (Feet) 255 Feet   370  ? Assistive device None   ? Gait Pattern Step-through pattern;Decreased arm swing - right;Decreased arm swing - left;Decreased step length - right;Decreased step length - left;Decreased stride length   ? Gait Comments Cues for posture, use of walking poles to faciitate arm swing.  Cues to look ahead at visual target.   ?  ? Exercises  ? Exercises Other Exercises   ? ?  ?  ? ?  ?Scapular retraction 2 x 10 reps ?Neck retraction 2 x 5 reps ?Cues for being "tall as the wall" with standing and gait activities. ? ? ? PWR [facility]) - 04/10/22 0940   ? ? PWR! exercises Moves in sitting;Moves in standing;Functional moves   ? PWR! Up x 10   standing  ? PWR! Sit to Stand x 10 reps, with focus on posture upon standing   ? PWR! Up x 10   sitting  ?  Comments PWR! Up sit>sit to stand>PWR! Up stand, x 5 reps   ? ?  ?  ? ?  ? ? ? ? ? ? ? PT Education - 04/10/22 0959   ? ? Education Details Updates to HEP-posture   ? Person(s) Educated Patient   ? Methods Demonstration;Handout;Explanation   ? Comprehension Verbalized understanding;Returned demonstration   ? ?  ?  ? ?  ? ? ? PT Short Term Goals - 03/23/22 1236   ? ?  ? PT SHORT TERM GOAL #1  ? Title The patient will be indep with initial HEP.   ? Time 4   ? Period Weeks   ? Status On-going   ? Target Date 03/18/22   ?  ? PT SHORT TERM GOAL #2  ? Title The patient will improve TUG cognitive to < or equal to 13 seconds to demo improved multi-tasking and mobility.   ? Baseline 16.06 sec 03/23/2022   ? Time 4   ? Period Weeks   ? Status Not Met   ? Target Date 03/18/22   ?  ? PT SHORT TERM GOAL #3  ? Title The patient will improve gait speed from 2.33 ft/sec to > or equal to 2.8 ft/sec demonstrating  improved functional mobility.   ? Baseline 3.28 ft/sec 03/23/2022   ? Time 4   ? Period Weeks   ? Status Achieved   ? Target Date 03/18/22   ?  ? PT SHORT TERM GOAL #4  ? Title The patient will verbalize understanding of support group and community class offerings for [employer]!.   ? Time 4   ? Period Weeks   ? Status Achieved   ? Target Date 03/18/22   ? ?  ?  ? ?  ? ? ? ? PT Long Term Goals - 02/16/22 1314   ? ?  ? PT LONG TERM GOAL #1  ? Title The patient will be indep with HEP progression.   ? Time 8   ? Period Weeks   ? Target Date 04/17/22   ?  ? PT LONG TERM GOAL #2  ? Title Improve mini BEST test from 18/28 to > or equal to 22/28.   ? Time 8   ? Period Weeks   ? Target Date 04/17/22   ?  ? PT LONG TERM GOAL #3  ? Title The patient will demonstrate more normalized gait pattern with reciprocal arm swing and L knee flexion with gait.   ? Time 8   ? Period Weeks   ? Target Date 04/17/22   ?  ? PT LONG TERM GOAL #4  ? Title The patient will negotiate steps with reciprocal pattern and one HR.   ? Time 8   ? Period Weeks   ? Target Date 04/17/22   ? ?  ?  ? ?  ? ? ? ? ? ? ? ? Plan - 04/10/22 1016   ? ? Clinical Impression Statement Pt presents to OPPT with reports of feeling more forward flexed posture at times, so session focused on postural retraining, cues for postural awareness with transfers and with gait.  Pt seems pleased with exercises and cueing for posture activities, and she responds well to additions to HEP for posture.   ? PT Treatment/Interventions ADLs/Self Care Home Management;Patient/family education;Gait training;Stair training;Functional mobility training;Therapeutic activities;Therapeutic exercise;Neuromuscular re-education;Balance training;Manual techniques   ? PT Next Visit Plan Check LTGs and plan for d/c next visit.   ? Consulted and Agree  with Plan of Care Patient   ? ?  ?  ? ?  ? ? ?Patient will benefit from skilled therapeutic intervention in order to improve the following deficits and  impairments:  Abnormal gait, Difficulty walking, Decreased strength, Postural dysfunction, Decreased balance ? ?Visit Diagnosis: ?Other abnormalities of gait and mobility ? ?Unsteadiness on feet ? ?Other symptoms and signs involving the nervous system ? ?Muscle weakness (generalized) ? ? ? ? ?Problem List ?Patient Active Problem List  ? Diagnosis Date Noted  ? Parkinson's disease ([place]) 02/04/2022  ? Hypotension   ? Elevated troponin   ? Nausea   ? Nausea and vomiting   ? Septic shock ([place]) 06/05/2020  ? AKI (acute kidney injury) ([place])   ? Metabolic acidosis, increased anion gap   ? Dyspnea   ? Agatston coronary artery calcium score between 100 and 199   ? Murmur 11/11/2016  ? 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 ? ?Johnny Latu W., PT ?04/10/2022, 10:17 AM ? ?[place] ?[facility] ?[address] ?[place], [place], [address] ?Phone: [phone]   Fax:  [phone] ? ?Name: Alexandria Wallace ?MRN: [id number] ?Date of Birth: 1941/02/05 ? ? ? ?

## 2022-04-13 ENCOUNTER — Ambulatory Visit: Payer: Medicare PPO

## 2022-04-13 DIAGNOSIS — R4701 Aphasia: Secondary | ICD-10-CM

## 2022-04-13 DIAGNOSIS — R471 Dysarthria and anarthria: Secondary | ICD-10-CM

## 2022-04-13 DIAGNOSIS — R1310 Dysphagia, unspecified: Secondary | ICD-10-CM | POA: Diagnosis not present

## 2022-04-13 DIAGNOSIS — R278 Other lack of coordination: Secondary | ICD-10-CM | POA: Diagnosis not present

## 2022-04-13 DIAGNOSIS — R2689 Other abnormalities of gait and mobility: Secondary | ICD-10-CM | POA: Diagnosis not present

## 2022-04-13 DIAGNOSIS — R41841 Cognitive communication deficit: Secondary | ICD-10-CM

## 2022-04-13 DIAGNOSIS — M6281 Muscle weakness (generalized): Secondary | ICD-10-CM | POA: Diagnosis not present

## 2022-04-13 DIAGNOSIS — R29818 Other symptoms and signs involving the nervous system: Secondary | ICD-10-CM | POA: Diagnosis not present

## 2022-04-13 DIAGNOSIS — R2681 Unsteadiness on feet: Secondary | ICD-10-CM | POA: Diagnosis not present

## 2022-04-13 NOTE — Therapy (Signed)
?OUTPATIENT SPEECH LANGUAGE PATHOLOGY PARKINSON'S EVALUATION ? ? ?Patient Name: Alexandria Wallace ?MRN: [id number] ?DOB:12-28-1940, 81 y.o., female ?Today's Date: 04/13/2022 ? ?PCP: Theadore Nan ?REFERRING PROVIDER: Alonza Bogus, DO ? ? End of Session - 04/13/22 1142   ? ? Visit Number 2   ? Number of Visits 13   ? Date for SLP Re-Evaluation 06/30/22   ? Authorization Type humana   ? Authorization - Visit Number 1   ? Authorization - Number of Visits 12   ? SLP Start Time 1022   ? SLP Stop Time  1105   ? SLP Time Calculation (min) 43 min   ? Activity Tolerance Patient tolerated treatment well   ? ?  ?  ? ?  ? ? ? ?Past Medical History:  ?Diagnosis Date  ? Colon polyp   ? High cholesterol   ? Mood disorder ([place])   ? ?Past Surgical History:  ?Procedure Laterality Date  ? COLECTOMY    ? EUS N/A 11/29/2015  ? Procedure: LOWER ENDOSCOPIC ULTRASOUND (EUS);  Surgeon: Arta Silence, MD;  Location: [facility] ENDOSCOPY;  Service: Endoscopy;  Laterality: N/A;  ? FLEXIBLE SIGMOIDOSCOPY N/A 01/27/2018  ? Procedure: FLEXIBLE SIGMOIDOSCOPY;  Surgeon: Otis Brace, MD;  Location: [facility] ENDOSCOPY;  Service: Gastroenterology;  Laterality: N/A;  ? LEFT HEART CATH AND CORONARY ANGIOGRAPHY N/A 11/15/2018  ? Procedure: LEFT HEART CATH AND CORONARY ANGIOGRAPHY;  Surgeon: Troy Sine, MD;  Location: [facility] LAB;  Service: Cardiovascular;  Laterality: N/A;  ? POLYPECTOMY  02/19/2016  ? ?Patient Active Problem List  ? Diagnosis Date Noted  ? Parkinson's disease ([place]) 02/04/2022  ? Hypotension   ? Elevated troponin   ? Nausea   ? Nausea and vomiting   ? Septic shock ([place]) 06/05/2020  ? AKI (acute kidney injury) ([place])   ? Metabolic acidosis, increased anion gap   ? Dyspnea   ? Agatston coronary artery calcium score between 100 and 199   ? Murmur 11/11/2016  ? 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 ? ?ONSET DATE: 2019 (sx), March 2023 (dx) ? ?REFERRING DIAG: Parkinson's Disease ? ?THERAPY DIAG:  ?Cognitive communication deficit - Plan: SLP  plan of care cert/re-cert ? ?Aphasia - Plan: SLP plan of care cert/re-cert ? ?Dysphagia, unspecified type - Plan: SLP plan of care cert/re-cert ? ?Dysarthria and anarthria - Plan: SLP plan of care cert/re-cert ? ?SUBJECTIVE:  ? ?SUBJECTIVE STATEMENT: ?"That (design generation) was the most difficult." (Re: SLP question which task was most challenging on cognitive linguistic eval) ? ?Pt accompanied by: self ? 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 Brain imaging (02/24/22): "No evidence of acute intracranial abnormality. Mild generalized cerebral atrophy." Pt was referred for PT/OT, and OT note from 03/30/22 states "Recently she has been mentioning some concerns with her thought processes and memory.  She also demonstrates some decreased working memory and increased difficulty with dual tasking during therapy sessions.The patient would benefit from SLP evaluation for cognition and memory." ST was ordered by Dr. Alfonso Patten. Tat.     ? ?PAIN:  ?Are you having pain? No ? ?PATIENT GOALS Improve thinking and word finding. ? ?OBJECTIVE:  ? ?DIAGNOSTIC FINDINGS: Cognitive Linguistic Quick Test (CLQT) completed in today's session. See results below in "today's treatment". ? ? ? ?PATIENT REPORTED OUTCOME MEASURES (PROM): ?  Pt completed the NINDS Cognitive Function QOL (long form) today and scored  110/140  (higher scores convey better QOL). ? ? ?TODAY'S TREATMENT:  ?Cognition (34 minutes): CLQT concluded today. Pt scored within normal limits on all domains however decr'd ability was skillfully noted by SLP today with attention to detail (pt filled out PROM and halfway through second page realized she was answering for activities occurring in the LAST 7 DAYS, Alexandria Wallace repeated 3  designs on Design Generation task, req'd double check to find all symbols in a scanning task). She reports attention changes in everyday tasks such as forgetting an idea she had while engaging in another high-attention task such as driving. On PROM listed above, pt mentioned biggest difficulties with slower reaction and thinking, and with memory and attention (selective, sustained, divided). ? ?Swallowing (8 minutes): SLP used skilled question asking to ascertain best "tricks" pt uses currently for med administration - reducing number of meds/time to 3, and using a corn chip as a "plunger" to assist in clearance. Today SLP explained in detail pt to use dollop of applesauce or some other puree to aid in pharyngeal transit, and incr'd strength of swallow. ? ?PATIENT EDUCATION: ?Education details: deficit areas, goals ?Person educated: Patient ?Education method: Explanation ?Education comprehension: verbalized understanding and needs further education ? ? ?HOME EXERCISE PROGRAM: ?-will at least include abdominal breathing - will be initiated next session ? ? ? ? ?GOALS: ?Goals reviewed with patient? Yes ? ?SHORT TERM GOALS: Target date: 05/05/2022  ? ?Pt will complete CLQT in first session ?Baseline: ?Goal status: Achieved  ? ?2.  Pt will use 80% abdominal breathing at rest in 2 sessions ?Baseline:  ?Goal status: Ongoing ? ?3.  Pt will demo selective attention in mod busy environment for 8 minutes of functional mod complex tasks in 2 sessions ?Baseline:  ?Goal status: Ongoing ? ?4.  Pt will state 3 compensations for attention she could use ?Baseline:  ?Goal status: Ongoing ? ?5.  Pt will report successful pill clearance using compensations between 2 sessions ?Baseline:  ?Goal status: Ongoing ? ? ?LONG TERM GOALS: Target date: 06/30/2022  (pt to go on three week vacation so date was extended) ? ?Pt will use 80% abdominal breathing at rest in 2 sessions ?Baseline:  ?Goal status: Ongoing ? ?2.  Pt will demo selective  attention in mod busy environment for 15 minutes of functional mod complex tasks in 3 sessions ?Baseline:  ?Goal status: Ongoing ? ?3. Pt will state usage of 2 compensations for attention between 3 sessions ?Baseline:  ?Goal status: Ongoing ? ?4. Pt will complete tasks for anomia (VNEST, SFA) independently in 2 sessions ?Baseline:  ?Goal status: Ongoing ? ?5  Pt will score an improved PROM in the last session ?Baseline:  ?Goal status: Ongoing ? ? ? ?ASSESSMENT: ? ?CLINICAL IMPRESSION: ?Alexandria Wallace continues in skilled ST for cognitive communication deficits, dysphagia, decr'd breath support for speech (dysarthria), and sx's of aphasia. SLP completed CLQT today and also discussed some compensations for pt's reported pill dysphagia. Pt would cont to benefit from skilled ST targeting the goals above. ? ?OBJECTIVE IMPAIRMENTS  ?Objective impairments include attention, aphasia, dysarthria, and dysphagia. These impairments are limiting patient from household responsibilities, effectively communicating at home and in community, and safety when swallowing.Factors affecting potential to achieve goals and functional outcome are medical prognosis.. Patient will benefit from skilled SLP services to address above impairments and improve overall function. ? ?REHAB POTENTIAL: Excellent ? ?PLAN: ?SLP FREQUENCY: 2x/week for 4 weeks, then x1/week  x4 weeks ? ?SLP DURATION: 12 weeks (pt to be on 3-week vacation in the middle of ST plan of care) ? ?PLANNED INTERVENTIONS: Aspiration precaution training, Language facilitation, Environmental controls, Cueing hierachy, Cognitive reorganization, Internal/external aids, Functional tasks, SLP instruction and feedback, Compensatory strategies, Patient/family education, and HEP for loud /a/. ? ? ? ?Alexandria Wallace, CCC-SLP ?04/13/2022, 11:48 AM ? ? ? ?  ?

## 2022-04-13 NOTE — Patient Instructions (Signed)
? ? ?  We will work on refining your abdominal breathing - this is the most efficient way you can breathe (and talk!). You did great starting out with this today! ? ?Think about taking a dollop of applesauce of yogurt or pudding and pushing your pills into it and taking that down in order to make it easier to swallow your pills the first time.  ?

## 2022-04-16 ENCOUNTER — Other Ambulatory Visit: Payer: Self-pay | Admitting: Neurology

## 2022-04-16 ENCOUNTER — Ambulatory Visit: Payer: Medicare PPO | Admitting: Occupational Therapy

## 2022-04-16 ENCOUNTER — Ambulatory Visit: Payer: Medicare PPO | Admitting: Physical Therapy

## 2022-04-16 ENCOUNTER — Encounter: Payer: Self-pay | Admitting: Occupational Therapy

## 2022-04-16 DIAGNOSIS — R4701 Aphasia: Secondary | ICD-10-CM | POA: Diagnosis not present

## 2022-04-16 DIAGNOSIS — R29818 Other symptoms and signs involving the nervous system: Secondary | ICD-10-CM | POA: Diagnosis not present

## 2022-04-16 DIAGNOSIS — R2689 Other abnormalities of gait and mobility: Secondary | ICD-10-CM | POA: Diagnosis not present

## 2022-04-16 DIAGNOSIS — G20 Parkinson's disease: Secondary | ICD-10-CM

## 2022-04-16 DIAGNOSIS — R2681 Unsteadiness on feet: Secondary | ICD-10-CM | POA: Diagnosis not present

## 2022-04-16 DIAGNOSIS — R278 Other lack of coordination: Secondary | ICD-10-CM

## 2022-04-16 DIAGNOSIS — R41841 Cognitive communication deficit: Secondary | ICD-10-CM | POA: Diagnosis not present

## 2022-04-16 DIAGNOSIS — R1310 Dysphagia, unspecified: Secondary | ICD-10-CM | POA: Diagnosis not present

## 2022-04-16 DIAGNOSIS — R471 Dysarthria and anarthria: Secondary | ICD-10-CM | POA: Diagnosis not present

## 2022-04-16 DIAGNOSIS — M6281 Muscle weakness (generalized): Secondary | ICD-10-CM

## 2022-04-16 NOTE — Therapy (Signed)
[facility] [address] [place], [place], [address] Phone: [phone]   Fax:  [phone]  Physical Therapy Treatment/Discharge Summary  Patient Details  Name: Alexandria Wallace MRN: [id number] Date of Birth: 1941-07-22 Referring Provider (PT): Wells Guiles Tat, DO   PHYSICAL THERAPY DISCHARGE SUMMARY  Visits from Start of Care: 10  Current functional level related to goals / functional outcomes:  PT Long Term Goals - 04/16/22 0936       PT LONG TERM GOAL #1   Title The patient will be indep with HEP progression.    Time 8    Period Weeks    Status Achieved    Target Date 04/17/22      PT LONG TERM GOAL #2   Title Improve mini BEST test from 18/28 to > or equal to 22/28.    Baseline Score 23/28 04/16/2022    Time 8    Period Weeks    Status Achieved    Target Date 04/17/22      PT LONG TERM GOAL #3   Title The patient will demonstrate more normalized gait pattern with reciprocal arm swing and L knee flexion with gait.    Time 8    Period Weeks    Status Achieved    Target Date 04/17/22      PT LONG TERM GOAL #4   Title The patient will negotiate steps with reciprocal pattern and one HR.    Time 8    Period Weeks    Status Achieved    Target Date 04/17/22            Pt has met 4 of 4 LTGs.   Remaining deficits: Occasional bradykinesia, decreased timing/coordination of gait   Education / Equipment: Educated in [employer], community fitness options   Patient agrees to discharge. Patient goals were met. Patient is being discharged due to meeting the stated rehab goals.  Recommend return PT screen in 6 months due to progressive nature of disease process.  Mady Haagensen, PT 04/16/22 10:14 AM Phone: [phone] Fax: [phone]    Encounter Date: 04/16/2022   PT End of Session - 04/16/22 0934     Visit Number 10    Number of Visits 16    Date for PT Re-Evaluation 04/17/22    Authorization Type [employer]  Medicare    PT Start Time 0932    PT Stop Time 1005    PT Time Calculation (min) 33 min    Activity Tolerance Patient tolerated treatment well    Behavior During Therapy WFL for tasks assessed/performed             Past Medical History:  Diagnosis Date   Colon polyp    High cholesterol    Mood disorder [facility])     Past Surgical History:  Procedure Laterality Date   COLECTOMY     EUS N/A 11/29/2015   Procedure: LOWER ENDOSCOPIC ULTRASOUND (EUS);  Surgeon: Arta Silence, MD;  Location: [facility] ENDOSCOPY;  Service: Endoscopy;  Laterality: N/A;   FLEXIBLE SIGMOIDOSCOPY N/A 01/27/2018   Procedure: FLEXIBLE SIGMOIDOSCOPY;  Surgeon: Otis Brace, MD;  Location: [facility] ENDOSCOPY;  Service: Gastroenterology;  Laterality: N/A;   LEFT HEART CATH AND CORONARY ANGIOGRAPHY N/A 11/15/2018   Procedure: LEFT HEART CATH AND CORONARY ANGIOGRAPHY;  Surgeon: Troy Sine, MD;  Location: [facility] CV LAB;  Service: Cardiovascular;  Laterality: N/A;   POLYPECTOMY  02/19/2016    There were no vitals filed for this visit.  Subjective Assessment - 04/16/22 0935     Subjective Feel overall, I've improved with my movement.  Weekly routine:  walk almost daily, chair yoga, [employer], cycling and do HEP.  Feel tired today.    Patient Stated Goals "To walk perfect"    Currently in Pain? No/denies                       Performed/reviewed HEP exercises Added 04/10/2022-pt return demo understanding.  - Seated Scapular Retraction  - 1 x daily - 5 x weekly - 1-2 sets - 10 reps - 3 sec hold - Standing Isometric Cervical Retraction with Chin Tucks and Ball at [employer]  - 1 x daily - 5 x weekly - 1-2 sets - 10 reps - 3 sec hold           [facility] Adult PT Treatment/Exercise - 04/16/22 0001       Transfers   Transfers Sit to Stand;Stand to Sit    Sit to Stand 6: Modified independent (Device/Increase time);Without upper extremity assist;From chair/3-in-1    Five time sit to stand comments   12.78    Stand to Sit 6: Modified independent (Device/Increase time);Without upper extremity assist;To chair/3-in-1      Ambulation/Gait   Gait velocity 10.81 sec = 3.08 ft/sec      Timed Up and Go Test   TUG Normal TUG;Cognitive TUG    Normal TUG (seconds) 11.03    Cognitive TUG (seconds) 13.25      Mini-BESTest   Sit To Stand Normal: Comes to stand without use of hands and stabilizes independently.    Rise to Toes Normal: Stable for 3 s with maximum height.    Stand on one leg (left) Moderate: < 20 s   3.59, 11.94   Stand on one leg (right) Normal: 20 s.    Stand on one leg - lowest score 1    Compensatory Stepping Correction - Forward Normal: Recovers independently with a single, large step (second realignement is allowed).    Compensatory Stepping Correction - Backward Moderate: More than one step is required to recover equilibrium    Compensatory Stepping Correction - Left Lateral Moderate: Several steps to recover equilibrium    Compensatory Stepping Correction - Right Lateral Moderate: Several steps to recover equilibrium    Stepping Corredtion Lateral - lowest score 1    Stance - Feet together, eyes open, firm surface  Normal: 30s    Stance - Feet together, eyes closed, foam surface  Normal: 30s    Incline - Eyes Closed Normal: Stands independently 30s and aligns with gravity    Change in Gait Speed Normal: Significantly changes walkling speed without imbalance    Walk with head turns - Horizontal Normal: performs head turns with no change in gait speed and good balance    Walk with pivot turns Normal: Turns with feet close FAST (< 3 steps) with good balance.    Step over obstacles Moderate: Steps over box but touches box OR displays cautious behavior by slowing gait.    Timed UP & GO with Dual Task Moderate: Dual Task affects either counting OR walking (>10%) when compared to the TUG without Dual Task.    Mini-BEST total score 23                     PT Education  - 04/16/22 1011     Education Details Progress towards goals, plan for d/c this visit; discussed/reviewed  optimal PD fitness routine and pt's current exercise routine.  Encouraged her to continue; plan for return screen.    Person(s) Educated Patient    Methods Explanation    Comprehension Verbalized understanding              PT Short Term Goals - 03/23/22 1236       PT SHORT TERM GOAL #1   Title The patient will be indep with initial HEP.    Time 4    Period Weeks    Status On-going    Target Date 03/18/22      PT SHORT TERM GOAL #2   Title The patient will improve TUG cognitive to < or equal to 13 seconds to demo improved multi-tasking and mobility.    Baseline 16.06 sec 03/23/2022    Time 4    Period Weeks    Status Not Met    Target Date 03/18/22      PT SHORT TERM GOAL #3   Title The patient will improve gait speed from 2.33 ft/sec to > or equal to 2.8 ft/sec demonstrating improved functional mobility.    Baseline 3.28 ft/sec 03/23/2022    Time 4    Period Weeks    Status Achieved    Target Date 03/18/22      PT SHORT TERM GOAL #4   Title The patient will verbalize understanding of support group and community class offerings for [employer]land [employer]    Time 4    Period Weeks    Status Achieved    Target Date 03/18/22               PT Long Term Goals - 04/16/22 0936       PT LONG TERM GOAL #1   Title The patient will be indep with HEP progression.    Time 8    Period Weeks    Status Achieved    Target Date 04/17/22      PT LONG TERM GOAL #2   Title Improve mini BEST test from 18/28 to > or equal to 22/28.    Baseline Score 23/28 04/16/2022    Time 8    Period Weeks    Status Achieved    Target Date 04/17/22      PT LONG TERM GOAL #3   Title The patient will demonstrate more normalized gait pattern with reciprocal arm swing and L knee flexion with gait.    Time 8    Period Weeks    Status Achieved    Target Date 04/17/22      PT LONG TERM GOAL #4    Title The patient will negotiate steps with reciprocal pattern and one HR.    Time 8    Period Weeks    Status Achieved    Target Date 04/17/22                   Plan - 04/16/22 1009     Clinical Impression Statement Assessed LTGs this visit, with pt meeting all LTGs.  She has demonstrated improvements in all functional measures since onset of PT.  She reports having comprehensive weekly exercise routine, and she is consistently attending community [employer] exercise classes.  She is appropriate for d/c this visit.    PT Treatment/Interventions ADLs/Self Care Home Management;Patient/family education;Gait training;Stair training;Functional mobility training;Therapeutic activities;Therapeutic exercise;Neuromuscular re-education;Balance training;Manual techniques    PT Next Visit Plan D/C this visit and plan for return screens in 6 months  Consulted and Agree with Plan of Care Patient             Patient will benefit from skilled therapeutic intervention in order to improve the following deficits and impairments:  Abnormal gait, Difficulty walking, Decreased strength, Postural dysfunction, Decreased balance  Visit Diagnosis: Other abnormalities of gait and mobility  Unsteadiness on feet  Other symptoms and signs involving the nervous system     Problem List Patient Active Problem List   Diagnosis Date Noted   Parkinson's disease ([place]) 02/04/2022   Hypotension    Elevated troponin    Nausea    Nausea and vomiting    Septic shock ([place]) 06/05/2020   AKI (acute kidney injury) ([place])    Metabolic acidosis, increased anion gap    Dyspnea    Agatston coronary artery calcium score between 100 and 199    Murmur 11/11/2016   Mixed hyperlipidemia 11/11/2016    Shavonte Zhao W., PT 04/16/2022, 10:12 AM  [facility] [address], [place], [place], [address] Phone: [phone]   Fax:  [phone]  Name: Alexandria Wallace MRN: [id number] Date of Birth: 03-22-1941

## 2022-04-16 NOTE — Therapy (Signed)
[facility] [address] [place], [place], [address] Phone: [phone]   Fax:  [phone]  Occupational Therapy Treatment  Patient Details  Name: Alexandria Wallace MRN: [id number] Date of Birth: [birth year] Referring Provider (OT): Tat, Eustace Quail, DO  OCCUPATIONAL THERAPY DISCHARGE SUMMARY  Visits from Start of Care: 9  Current functional level related to goals / functional outcomes: Pt has made great progress with quality of movements as needed for self-care tasks of managing clothing fasteners and donning/doffing jacket as well as improved legibility of handwriting.  Pt is demonstrating increased awareness and knowledge of PD resources and education.     Remaining deficits: Pt continues to demonstrate mild impairments with memory and cognition.  Pt is currently receiving Speech Therapy to further assess and address.   Education / Equipment: Provided with HEP for coordination and large amplitude movements.  Pt has been education on memory compensation strategies and community resources for PD.   Patient agrees to discharge. Patient goals were met. Patient is being discharged due to meeting the stated rehab goals..      Encounter Date: 04/16/2022   OT End of Session - 04/16/22 1021     Visit Number 9    Number of Visits 9    Date for OT Re-Evaluation 04/17/22    Authorization Type [employer]    OT Start Time 1016    OT Stop Time 1047    OT Time Calculation (min) 31 min    Activity Tolerance Patient tolerated treatment well    Behavior During Therapy WFL for tasks assessed/performed             Past Medical History:  Diagnosis Date   Colon polyp    High cholesterol    Mood disorder [facility])     Past Surgical History:  Procedure Laterality Date   COLECTOMY     EUS N/A 11/29/2015   Procedure: LOWER ENDOSCOPIC ULTRASOUND (EUS);  Surgeon: Arta Silence, MD;  Location: [facility] ENDOSCOPY;  Service: Endoscopy;   Laterality: N/A;   FLEXIBLE SIGMOIDOSCOPY N/A 01/27/2018   Procedure: FLEXIBLE SIGMOIDOSCOPY;  Surgeon: Otis Brace, MD;  Location: [facility] ENDOSCOPY;  Service: Gastroenterology;  Laterality: N/A;   LEFT HEART CATH AND CORONARY ANGIOGRAPHY N/A 11/15/2018   Procedure: LEFT HEART CATH AND CORONARY ANGIOGRAPHY;  Surgeon: Troy Sine, MD;  Location: [place] CV LAB;  Service: Cardiovascular;  Laterality: N/A;   POLYPECTOMY  02/19/2016    There were no vitals filed for this visit.   Subjective Assessment - 04/16/22 1020     Subjective  "I'm very tired today"    Pertinent History high cholesterol, mood disorder    Patient Stated Goals To walk better and get stronger    Currently in Pain? No/denies    Pain Onset More than a month ago                [facility] OT Assessment - 04/16/22 0001       Observation/Other Assessments   Physical Performance Test   Yes    Simulated Eating Time (seconds) 9.72    Donning Doffing Jacket Time (seconds) 7.56    Donning Doffing Jacket Comments 17.25   3 button/unbutton: 24.5     Coordination   Right 9 Hole Peg Test 22.6    Left 9 Hole Peg Test 30.78              Engaged in handwriting task for thought congruence and legibility of handwriting.  Pt demonstrating significant improvements in handwriting as well as continuity of thought with writing tasks.  Pt reports noticing improvements in quality of movements from walking to coordination tasks and handwriting.                  OT Short Term Goals - 03/16/22 1324       OT SHORT TERM GOAL #1   Title Pt will be independent with PD specific HEP    Time 4    Period Weeks    Status Achieved    Target Date 03/20/22      OT SHORT TERM GOAL #2   Title Pt will verbalize understanding of adapted strategies to maximize safety and I with ADLs/ IADLs.    Time 4    Period Weeks    Status Achieved      OT SHORT TERM GOAL #3   Title Pt will demonstrate improved UE functional use  for ADLs as evidenced by increasing box/ blocks score by 2 blocks with LUE.   R: 53 and L: 53   Baseline R: 48 and L: 46 (pt bumping into partition 25% of time on L)    Time 4    Period Weeks    Status Achieved               OT Long Term Goals - 04/16/22 1022       OT LONG TERM GOAL #1   Title Pt will write a short paragraph with 100% legibility and no significant decrease in letter size    Time 8    Period Weeks    Status Achieved    Target Date 04/17/22      OT LONG TERM GOAL #2   Title Pt will demonstrate improved ease with fastening buttons as evidenced by decreasing 3 button/ unbutton time to 21 seconds or less.    Baseline 24.5   19.75 seconds on 03/23/22   Time 8    Period Weeks    Status Achieved      OT LONG TERM GOAL #3   Title Pt will demonstrate ability to retrieve a lightweight object at 150 shoulder flexion with LUE    Baseline R: 150, L: 140    Time 8    Period Weeks    Status Achieved      OT LONG TERM GOAL #4   Title Pt will demonstrate understanding of memory compensations and ways to keep thinking skills sharp    Time 8    Period Weeks    Status Partially Met      OT LONG TERM GOAL #5   Title Pt will verbalize understanding of ways to prevent future PD related complications and PD community resources.    Time 8    Period Weeks    Status Achieved                   Plan - 04/16/22 1022     Clinical Impression Statement Pt has shown improvements in coordination as needed for self-care tasks and handwriting. Pt is demonstrating improved size and legibility with handwriting and improved thought processes with writing short paragraph. Pt has shown good carryover of education and application during sessions.    OT Occupational Profile and History Detailed Assessment- Review of Records and additional review of physical, cognitive, psychosocial history related to current functional performance    Occupational performance deficits (Please refer  to evaluation for details): ADL's;IADL's    Body Structure /  Function / Physical Skills ADL;Balance;Body mechanics;Coordination;Decreased knowledge of precautions;Decreased knowledge of use of DME;Dexterity;Endurance;Flexibility;FMC;GMC;IADL;Mobility;ROM;Pain;Strength;UE functional use;Tone    Cognitive Skills Memory    Rehab Potential Good    Clinical Decision Making Limited treatment options, no task modification necessary    Comorbidities Affecting Occupational Performance: May have comorbidities impacting occupational performance    Modification or Assistance to Complete Evaluation  No modification of tasks or assist necessary to complete eval    OT Frequency 1x / week    OT Duration 8 weeks    OT Treatment/Interventions Self-care/ADL training;Moist Heat;Ultrasound;Therapeutic exercise;Energy conservation;Neuromuscular education;DME and/or AE instruction;Functional Mobility Training;Manual Therapy;Therapeutic activities;Cognitive remediation/compensation;Patient/family education;Balance training    Plan D/C from therapy.  Recommend f/u screen in 6-8 months    OT Home Exercise Plan [facility]    Consulted and Agree with Plan of Care Patient             Patient will benefit from skilled therapeutic intervention in order to improve the following deficits and impairments:   Body Structure / Function / Physical Skills: ADL, Balance, Body mechanics, Coordination, Decreased knowledge of precautions, Decreased knowledge of use of DME, Dexterity, Endurance, Flexibility, FMC, GMC, IADL, Mobility, ROM, Pain, Strength, UE functional use, Tone Cognitive Skills: Memory     Visit Diagnosis: Muscle weakness (generalized)  Other lack of coordination  Other symptoms and signs involving the nervous system    Problem List Patient Active Problem List   Diagnosis Date Noted   Parkinson's disease ([place]) 02/04/2022   Hypotension    Elevated troponin    Nausea    Nausea and vomiting    Septic  shock ([place]) 06/05/2020   AKI (acute kidney injury) ([place])    Metabolic acidosis, increased anion gap    Dyspnea    Agatston coronary artery calcium score between 100 and 199    Murmur 11/11/2016   Mixed hyperlipidemia 11/11/2016    Simonne Come, OT 04/16/2022, 1:04 PM   [facility] [address], [place], [place], [address] Phone: [phone]   Fax:  [phone]  Name: ALEIRA DEITER MRN: [id number] Date of Birth: [birth year]

## 2022-04-17 ENCOUNTER — Ambulatory Visit: Payer: Medicare PPO

## 2022-04-17 ENCOUNTER — Telehealth: Payer: Self-pay | Admitting: Neurology

## 2022-04-17 DIAGNOSIS — R41841 Cognitive communication deficit: Secondary | ICD-10-CM

## 2022-04-17 DIAGNOSIS — G20 Parkinson's disease: Secondary | ICD-10-CM

## 2022-04-17 DIAGNOSIS — R1310 Dysphagia, unspecified: Secondary | ICD-10-CM

## 2022-04-17 DIAGNOSIS — R278 Other lack of coordination: Secondary | ICD-10-CM | POA: Diagnosis not present

## 2022-04-17 DIAGNOSIS — R4701 Aphasia: Secondary | ICD-10-CM | POA: Diagnosis not present

## 2022-04-17 DIAGNOSIS — R2681 Unsteadiness on feet: Secondary | ICD-10-CM | POA: Diagnosis not present

## 2022-04-17 DIAGNOSIS — R471 Dysarthria and anarthria: Secondary | ICD-10-CM

## 2022-04-17 DIAGNOSIS — R29818 Other symptoms and signs involving the nervous system: Secondary | ICD-10-CM | POA: Diagnosis not present

## 2022-04-17 DIAGNOSIS — M6281 Muscle weakness (generalized): Secondary | ICD-10-CM | POA: Diagnosis not present

## 2022-04-17 DIAGNOSIS — R2689 Other abnormalities of gait and mobility: Secondary | ICD-10-CM | POA: Diagnosis not present

## 2022-04-17 DIAGNOSIS — G20A1 Parkinson's disease without dyskinesia, without mention of fluctuations: Secondary | ICD-10-CM

## 2022-04-17 MED ORDER — CARBIDOPA-LEVODOPA 25-100 MG PO TABS: 1.0000 | ORAL_TABLET | Freq: Three times a day (TID) | ORAL | 0 refills | Status: DC

## 2022-04-17 NOTE — Therapy (Signed)
OUTPATIENT SPEECH LANGUAGE PATHOLOGY TREATMENT NOTE  Patient Name: Alexandria Wallace MRN: [id number] DOB:1941-09-01, 81 y.o., female Today's Date: 04/17/2022  PCP: Theadore Nan REFERRING PROVIDER: Alonza Bogus, DO   End of Session - 04/17/22 1159     Visit Number 3    Number of Visits 13    Date for SLP Re-Evaluation 06/30/22    Authorization Type humana    Authorization - Visit Number 2    Authorization - Number of Visits 12    SLP Start Time 6160    SLP Stop Time  1146    SLP Time Calculation (min) 41 min    Activity Tolerance Patient tolerated treatment well               Past Medical History:  Diagnosis Date   Colon polyp    High cholesterol    Mood disorder [facility])    Past Surgical History:  Procedure Laterality Date   COLECTOMY     EUS N/A 11/29/2015   Procedure: LOWER ENDOSCOPIC ULTRASOUND (EUS);  Surgeon: Arta Silence, MD;  Location: [facility] ENDOSCOPY;  Service: Endoscopy;  Laterality: N/A;   FLEXIBLE SIGMOIDOSCOPY N/A 01/27/2018   Procedure: FLEXIBLE SIGMOIDOSCOPY;  Surgeon: Otis Brace, MD;  Location: [facility] ENDOSCOPY;  Service: Gastroenterology;  Laterality: N/A;   LEFT HEART CATH AND CORONARY ANGIOGRAPHY N/A 11/15/2018   Procedure: LEFT HEART CATH AND CORONARY ANGIOGRAPHY;  Surgeon: Troy Sine, MD;  Location: [place] CV LAB;  Service: Cardiovascular;  Laterality: N/A;   POLYPECTOMY  02/19/2016   Patient Active Problem List   Diagnosis Date Noted   Parkinson's disease ([place]) 02/04/2022   Hypotension    Elevated troponin    Nausea    Nausea and vomiting    Septic shock ([place]) 06/05/2020   AKI (acute kidney injury) ([place])    Metabolic acidosis, increased anion gap    Dyspnea    Agatston coronary artery calcium score between 100 and 199    Murmur 11/11/2016   Mixed hyperlipidemia 11/11/2016    ONSET DATE: 2019 (sx), March 2023 (dx)  REFERRING DIAG: Parkinson's Disease  THERAPY DIAG:  Dysarthria and anarthria  Aphasia  Cognitive  communication deficit  Dysphagia, unspecified type  SUBJECTIVE:   SUBJECTIVE STATEMENT: Pt tells SLP she tried applesauce with her meds since last session.  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 Brain imaging (02/24/22): "No evidence of acute intracranial abnormality. Mild generalized cerebral atrophy." Pt was referred for PT/OT, and OT note from 03/30/22 states "Recently she has been mentioning some concerns with her thought processes and memory.  She also demonstrates some decreased working memory and increased difficulty with dual tasking during therapy sessions.The patient would benefit from SLP evaluation for cognition and memory." ST was ordered by Dr. Alfonso Patten. Tat.      PAIN:  Are you having pain? No  PATIENT GOALS Improve thinking and word finding.  OBJECTIVE:   TODAY'S TREATMENT:  04/17/22 Swallowing: Pt used applesauce with meds and also after swallowing meds bolus, but she says her compensation of corn chip still works better. Given this, SLP educated pt today on four swallow exercises - chin tuck against resistance, effortful swallow, Mendelsohn, and Masako. SLP used demo, written handout and verbal cues for teaching these to pt  and pt return demonstrated each correctly before SLP moved on to next exercise. SLP also told pt to swallow with effort during liquid POs and when she thinks about it with solid POs. Speech: SLP reviewed AB with pt using demo and verbal cues. Pt with 2 minutes AB trial and observed with 80% AB. Additional 3 minute trial with 85% success with rare min A. SLP provided rationale for AB (again) for pt and discussed physiology of what occurs with AB. SLP believes pt will do well to  practice 7-10 minutes BID this weekend and then beginning Monday incr to 15 minutes BID. Pt agrees with this See "pt instructions" note same date as this note for more details about HEP for AB and for dysphagia.  04/13/22 Cognition (34 minutes): CLQT concluded today. Pt scored within normal limits on all domains however decr'd ability was skillfully noted by SLP today with attention to detail (pt filled out PROM and halfway through second page realized she was answering for activities occurring in the LAST 7 DAYS, Alexandria Wallace repeated 3 designs on Design Generation task, req'd double check to find all symbols in a scanning task). She reports attention changes in everyday tasks such as forgetting an idea she had while engaging in another high-attention task such as driving. On PROM listed above, pt mentioned biggest difficulties with slower reaction and thinking, and with memory and attention (selective, sustained, divided).  Swallowing (8 minutes): SLP used skilled question asking to ascertain best "tricks" pt uses currently for med administration - reducing number of meds/time to 3, and using a corn chip as a "plunger" to assist in clearance. Today SLP explained in detail pt to use dollop of applesauce or some other puree to aid in pharyngeal transit, and incr'd strength of swallow.  PATIENT EDUCATION: Education details: see 04/17/22 note for details Person educated: Patient Education method: Explanation, Demonstration, Verbal cues, and Handouts Education comprehension: verbalized understanding, returned demonstration, and needs further education   HOME EXERCISE PROGRAM:  For dysphagia and AB -see "pt instructions" note same date as this note under "chart review"-> "notes".     GOALS: Goals reviewed with patient? Yes  SHORT TERM GOALS: Target date: 05/05/2022   Pt will complete CLQT in first session Baseline: Goal status: Achieved   2.  Pt will use 80% abdominal breathing at rest in 2  sessions Baseline:  Goal status: Ongoing  3.  Pt will demo selective attention in mod busy environment for 8 minutes of functional mod complex tasks in 2 sessions Baseline:  Goal status: Ongoing  4.  Pt will state 3 compensations for attention she could use Baseline:  Goal status: Ongoing  5.  Pt will report successful pill clearance using compensations between 2 sessions Baseline:  Goal status: Ongoing  6.  Pt complete dysphagia HEP with modified independence in 2 sessions Baseline:  Goal status: INITIAL   LONG TERM GOALS: Target date: 06/30/2022  (pt to go on three week vacation so date was extended)  Pt will use 80% abdominal breathing at rest in 2 sessions Baseline:  Goal status: Ongoing  2.  Pt will demo selective attention in mod busy environment for 15 minutes of functional mod complex tasks in 3 sessions Baseline:  Goal status: Ongoing  3. Pt will state usage of 2 compensations for attention between 3 sessions Baseline:  Goal status: Ongoing  4. Pt will complete tasks for anomia (VNEST, SFA) independently in 2 sessions Baseline:  Goal status: Ongoing  5  Pt will  score an improved PROM in the last session Baseline:  Goal status: Ongoing    ASSESSMENT:  CLINICAL IMPRESSION: Akiva continues in skilled ST for cognitive communication deficits, dysphagia, decr'd breath support for speech (dysarthria), and sx's of aphasia. Maxima learned dysphagia exercises today and cont'd work on AB, which she will begin doing with more practice at home. Pt would cont to benefit from skilled ST targeting the goals above.  OBJECTIVE IMPAIRMENTS  Objective impairments include attention, aphasia, dysarthria, and dysphagia. These impairments are limiting patient from household responsibilities, effectively communicating at home and in community, and safety when swallowing.Factors affecting potential to achieve goals and functional outcome are medical prognosis.. Patient will benefit  from skilled SLP services to address above impairments and improve overall function.  REHAB POTENTIAL: Excellent  PLAN: SLP FREQUENCY: 2x/week for 4 weeks, then x1/week x4 weeks  SLP DURATION: 12 weeks (pt to be on 3-week vacation in the middle of ST plan of care)  PLANNED INTERVENTIONS: Aspiration precaution training, Language facilitation, Environmental controls, Cueing hierachy, Cognitive reorganization, Internal/external aids, Functional tasks, SLP instruction and feedback, Compensatory strategies, Patient/family education, and HEP for loud /a/.    [place], [place] 04/17/2022, 12:11 PM

## 2022-04-17 NOTE — Telephone Encounter (Signed)
Pt called informed that a 3 month supply of her carbidopa levodopa was sent to her pharmacy

## 2022-04-17 NOTE — Telephone Encounter (Signed)
Patient is going out of town till end of June. Is requesting a 90 day supply vs 30 day. She has not picked up the RX from today.

## 2022-04-17 NOTE — Patient Instructions (Signed)
SWALLOWING EXERCISES Do these twice a day for 8 weeks, then 2-3 times per week afterwards  Effortful Swallows - swallow as hard as you can while you swallow your saliva or a sip of water - Repeat 10-15 times  Masako Swallow - swallow with your tongue sticking out - Stick tongue out past your lips and gently bite tongue with your teeth - Swallow, while holding your tongue with your teeth - Repeat 10-15 times *use a wet spoon if your mouth gets dry*  Mendelsohn Maneuver - "half swallow" exercise - Swallow, and squeeze hard (your adam's apple will stay elevated) for 5 seconds   - Repeat 10-15 times *use a wet spoon if your mouth gets dry*  Chin tuck against resistance - Place a rolled up towel UNDER your chin near your neck - hold it tight for 45-60 seconds - Repeat 3 times   This weekend, practice abdominal breathing (AB) 7-10 minutes twice a day. Starting Monday, practice 15 minutes, twice a day.

## 2022-04-21 ENCOUNTER — Ambulatory Visit: Payer: Medicare PPO

## 2022-04-21 DIAGNOSIS — R471 Dysarthria and anarthria: Secondary | ICD-10-CM | POA: Diagnosis not present

## 2022-04-21 DIAGNOSIS — M6281 Muscle weakness (generalized): Secondary | ICD-10-CM | POA: Diagnosis not present

## 2022-04-21 DIAGNOSIS — R2689 Other abnormalities of gait and mobility: Secondary | ICD-10-CM | POA: Diagnosis not present

## 2022-04-21 DIAGNOSIS — R1310 Dysphagia, unspecified: Secondary | ICD-10-CM | POA: Diagnosis not present

## 2022-04-21 DIAGNOSIS — R41841 Cognitive communication deficit: Secondary | ICD-10-CM

## 2022-04-21 DIAGNOSIS — R4701 Aphasia: Secondary | ICD-10-CM

## 2022-04-21 DIAGNOSIS — R29818 Other symptoms and signs involving the nervous system: Secondary | ICD-10-CM | POA: Diagnosis not present

## 2022-04-21 DIAGNOSIS — R278 Other lack of coordination: Secondary | ICD-10-CM | POA: Diagnosis not present

## 2022-04-21 DIAGNOSIS — R2681 Unsteadiness on feet: Secondary | ICD-10-CM | POA: Diagnosis not present

## 2022-04-21 NOTE — Therapy (Signed)
OUTPATIENT SPEECH LANGUAGE PATHOLOGY TREATMENT NOTE  Patient Name: Alexandria Wallace MRN: [id number] DOB:28-Jun-1941, 81 y.o., female Today's Date: 04/22/2022  PCP: Theadore Nan REFERRING PROVIDER: Alonza Bogus, DO   End of Session - 04/21/22 1025     Visit Number 4    Number of Visits 13    Date for SLP Re-Evaluation 06/30/22    Authorization Type humana    Authorization - Visit Number 3    Authorization - Number of Visits 12    SLP Start Time 2993    SLP Stop Time  1100    SLP Time Calculation (min) 38 min    Activity Tolerance Patient tolerated treatment well               Past Medical History:  Diagnosis Date   Colon polyp    High cholesterol    Mood disorder [facility])    Past Surgical History:  Procedure Laterality Date   COLECTOMY     EUS N/A 11/29/2015   Procedure: LOWER ENDOSCOPIC ULTRASOUND (EUS);  Surgeon: Arta Silence, MD;  Location: [facility] ENDOSCOPY;  Service: Endoscopy;  Laterality: N/A;   FLEXIBLE SIGMOIDOSCOPY N/A 01/27/2018   Procedure: FLEXIBLE SIGMOIDOSCOPY;  Surgeon: Otis Brace, MD;  Location: [facility] ENDOSCOPY;  Service: Gastroenterology;  Laterality: N/A;   LEFT HEART CATH AND CORONARY ANGIOGRAPHY N/A 11/15/2018   Procedure: LEFT HEART CATH AND CORONARY ANGIOGRAPHY;  Surgeon: Troy Sine, MD;  Location: [place] CV LAB;  Service: Cardiovascular;  Laterality: N/A;   POLYPECTOMY  02/19/2016   Patient Active Problem List   Diagnosis Date Noted   Parkinson's disease ([place]) 02/04/2022   Hypotension    Elevated troponin    Nausea    Nausea and vomiting    Septic shock ([place]) 06/05/2020   AKI (acute kidney injury) ([place])    Metabolic acidosis, increased anion gap    Dyspnea    Agatston coronary artery calcium score between 100 and 199    Murmur 11/11/2016   Mixed hyperlipidemia 11/11/2016    ONSET DATE: 2019 (sx), March 2023 (dx)  REFERRING DIAG: Parkinson's Disease  THERAPY DIAG:  Dysarthria and anarthria  Dysphagia, unspecified  type  Cognitive communication deficit  Aphasia  SUBJECTIVE:   SUBJECTIVE STATEMENT: "I don't always feel like I'm swallowing hard if I take a regular size sip. 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 Brain imaging (02/24/22): "No evidence of acute intracranial abnormality. Mild generalized cerebral atrophy." Pt was referred for PT/OT, and OT note from 03/30/22 states "Recently she has been mentioning some concerns with her thought processes and memory.  She also demonstrates some decreased working memory and increased difficulty with dual tasking during therapy sessions.The patient would benefit from SLP evaluation for cognition and memory." ST was ordered by Dr. Alfonso Patten. Tat.      PAIN:  Are you having pain? No  PATIENT GOALS Improve thinking and word finding.  OBJECTIVE:   TODAY'S TREATMENT:  04/21/22 Swallowing: Pt reports she is swallowing with effort with all POs including meds. SLP reviewed the HEP for dysphagia with rare min A (procedure for [facility]). Speech: SLP had pt practice AB for 3 minutes; pt with fast inhale/exhale cycle. SLP  demonstrated for pt more relaxed/longer respiration cycle and encouraged pt to target longer cycle as she practices AB 15 minutes BID this week.   04/17/22 Swallowing: Pt used applesauce with meds and also after swallowing meds bolus, but she says her compensation of corn chip still works better. Given this, SLP educated pt today on four swallow exercises - chin tuck against resistance, effortful swallow, Mendelsohn, and Masako. SLP used demo, written handout and verbal cues for teaching these to pt and pt return  demonstrated each correctly before SLP moved on to next exercise. SLP also told pt to swallow with effort during liquid POs and when she thinks about it with solid POs. Speech: SLP reviewed AB with pt using demo and verbal cues. Pt with 2 minutes AB trial and observed with 80% AB. Additional 3 minute trial with 85% success with rare min A. SLP provided rationale for AB (again) for pt and discussed physiology of what occurs with AB. SLP believes pt will do well to practice 7-10 minutes BID this weekend and then beginning Monday incr to 15 minutes BID. Pt agrees with this See "pt instructions" note same date as this note for more details about HEP for AB and for dysphagia.  04/13/22 Cognition (34 minutes): CLQT concluded today. Pt scored within normal limits on all domains however decr'd ability was skillfully noted by SLP today with attention to detail (pt filled out PROM and halfway through second page realized she was answering for activities occurring in the LAST 7 DAYS, Alexandria Wallace repeated 3 designs on Design Generation task, req'd double check to find all symbols in a scanning task). She reports attention changes in everyday tasks such as forgetting an idea she had while engaging in another high-attention task such as driving. On PROM listed above, pt mentioned biggest difficulties with slower reaction and thinking, and with memory and attention (selective, sustained, divided).  Swallowing (8 minutes): SLP used skilled question asking to ascertain best "tricks" pt uses currently for med administration - reducing number of meds/time to 3, and using a corn chip as a "plunger" to assist in clearance. Today SLP explained in detail pt to use dollop of applesauce or some other puree to aid in pharyngeal transit, and incr'd strength of swallow.  PATIENT EDUCATION: Education details: see 04/17/22 note for details Person educated: Patient Education method: Explanation, Demonstration, Verbal cues, and  Handouts Education comprehension: verbalized understanding, returned demonstration, and needs further education   HOME EXERCISE PROGRAM:  For dysphagia and AB -see "pt instructions" note same date as this note under "chart review"-> "notes".     GOALS: Goals reviewed with patient? Yes  SHORT TERM GOALS: Target date: 05/05/2022   Pt will complete CLQT in first session Baseline: Goal status: Achieved   2.  Pt will use 80% abdominal breathing at rest in 2 sessions Baseline:  Goal status: Ongoing  3.  Pt will demo selective attention in mod busy environment for 8 minutes of functional mod complex tasks in 2 sessions Baseline:  Goal status: Ongoing  4.  Pt will state 3 compensations for attention she could use Baseline:  Goal status: Ongoing  5.  Pt will report successful pill clearance using compensations between 2 sessions Baseline:  Goal status: Ongoing  6.  Pt complete dysphagia HEP with modified independence in 2 sessions Baseline:  Goal status: Ongoing   LONG TERM GOALS: Target date: 06/30/2022  (pt to go on three week vacation so date was extended)  Pt will use 80% abdominal  breathing at rest in 2 sessions Baseline:  Goal status: Ongoing  2.  Pt will demo selective attention in mod busy environment for 15 minutes of functional mod complex tasks in 3 sessions Baseline:  Goal status: Ongoing  3. Pt will state usage of 2 compensations for attention between 3 sessions Baseline:  Goal status: Ongoing  4. Pt will complete tasks for anomia (VNEST, SFA) independently in 2 sessions Baseline:  Goal status: Ongoing  5  Pt will score an improved PROM in the last session Baseline:  Goal status: Ongoing    ASSESSMENT:  CLINICAL IMPRESSION: Jalaya continues in skilled ST for cognitive communication deficits, dysphagia, decr'd breath support for speech (dysarthria), and sx's of aphasia. Alexandria Wallace learned dysphagia exercises today and cont'd work on AB, which she will  begin doing with more practice at home. Pt would cont to benefit from skilled ST targeting the goals above.  OBJECTIVE IMPAIRMENTS  Objective impairments include attention, aphasia, dysarthria, and dysphagia. These impairments are limiting patient from household responsibilities, effectively communicating at home and in community, and safety when swallowing.Factors affecting potential to achieve goals and functional outcome are medical prognosis.. Patient will benefit from skilled SLP services to address above impairments and improve overall function.  REHAB POTENTIAL: Excellent  PLAN: SLP FREQUENCY: 2x/week for 4 weeks, then x1/week x4 weeks  SLP DURATION: 12 weeks (pt to be on 3-week vacation in the middle of ST plan of care)  PLANNED INTERVENTIONS: Aspiration precaution training, Language facilitation, Environmental controls, Cueing hierachy, Cognitive reorganization, Internal/external aids, Functional tasks, SLP instruction and feedback, Compensatory strategies, Patient/family education, and HEP for loud /a/.    [place], [place] 04/22/2022, 8:49 AM

## 2022-04-23 ENCOUNTER — Ambulatory Visit: Payer: Medicare PPO

## 2022-04-23 DIAGNOSIS — R2681 Unsteadiness on feet: Secondary | ICD-10-CM | POA: Diagnosis not present

## 2022-04-23 DIAGNOSIS — M6281 Muscle weakness (generalized): Secondary | ICD-10-CM | POA: Diagnosis not present

## 2022-04-23 DIAGNOSIS — R278 Other lack of coordination: Secondary | ICD-10-CM | POA: Diagnosis not present

## 2022-04-23 DIAGNOSIS — R4701 Aphasia: Secondary | ICD-10-CM | POA: Diagnosis not present

## 2022-04-23 DIAGNOSIS — R41841 Cognitive communication deficit: Secondary | ICD-10-CM | POA: Diagnosis not present

## 2022-04-23 DIAGNOSIS — R471 Dysarthria and anarthria: Secondary | ICD-10-CM

## 2022-04-23 DIAGNOSIS — R2689 Other abnormalities of gait and mobility: Secondary | ICD-10-CM | POA: Diagnosis not present

## 2022-04-23 DIAGNOSIS — R1310 Dysphagia, unspecified: Secondary | ICD-10-CM | POA: Diagnosis not present

## 2022-04-23 DIAGNOSIS — R29818 Other symptoms and signs involving the nervous system: Secondary | ICD-10-CM | POA: Diagnosis not present

## 2022-04-23 NOTE — Patient Instructions (Signed)

## 2022-04-23 NOTE — Therapy (Signed)
OUTPATIENT SPEECH LANGUAGE PATHOLOGY TREATMENT NOTE  Patient Name: Alexandria Wallace MRN: [id number] DOB:09-15-1941, 81 y.o., female Today's Date: 04/23/2022  PCP: Theadore Nan REFERRING PROVIDER: Alonza Bogus, DO   End of Session - 04/23/22 1104     Visit Number 5    Number of Visits 13    Date for SLP Re-Evaluation 06/30/22    Authorization Type humana    Authorization - Visit Number 4    Authorization - Number of Visits 12    SLP Start Time 1025    SLP Stop Time  1056    SLP Time Calculation (min) 40 min    Activity Tolerance Patient tolerated treatment well                Past Medical History:  Diagnosis Date   Colon polyp    High cholesterol    Mood disorder [facility])    Past Surgical History:  Procedure Laterality Date   COLECTOMY     EUS N/A 11/29/2015   Procedure: LOWER ENDOSCOPIC ULTRASOUND (EUS);  Surgeon: Arta Silence, MD;  Location: [facility] ENDOSCOPY;  Service: Endoscopy;  Laterality: N/A;   FLEXIBLE SIGMOIDOSCOPY N/A 01/27/2018   Procedure: FLEXIBLE SIGMOIDOSCOPY;  Surgeon: Otis Brace, MD;  Location: [facility] ENDOSCOPY;  Service: Gastroenterology;  Laterality: N/A;   LEFT HEART CATH AND CORONARY ANGIOGRAPHY N/A 11/15/2018   Procedure: LEFT HEART CATH AND CORONARY ANGIOGRAPHY;  Surgeon: Troy Sine, MD;  Location: [place] CV LAB;  Service: Cardiovascular;  Laterality: N/A;   POLYPECTOMY  02/19/2016   Patient Active Problem List   Diagnosis Date Noted   Parkinson's disease ([place]) 02/04/2022   Hypotension    Elevated troponin    Nausea    Nausea and vomiting    Septic shock ([place]) 06/05/2020   AKI (acute kidney injury) ([place])    Metabolic acidosis, increased anion gap    Dyspnea    Agatston coronary artery calcium score between 100 and 199    Murmur 11/11/2016   Mixed hyperlipidemia 11/11/2016    ONSET DATE: 2019 (sx), March 2023 (dx)  REFERRING DIAG: Parkinson's Disease  THERAPY DIAG:  Cognitive communication  deficit  Aphasia  Dysphagia, unspecified type  Dysarthria and anarthria  SUBJECTIVE:   SUBJECTIVE STATEMENT: "I don't always feel like I'm swallowing hard if I take a regular size sip. 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 Brain imaging (02/24/22): "No evidence of acute intracranial abnormality. Mild generalized cerebral atrophy." Pt was referred for PT/OT, and OT note from 03/30/22 states "Recently she has been mentioning some concerns with her thought processes and memory.  She also demonstrates some decreased working memory and increased difficulty with dual tasking during therapy sessions.The patient would benefit from SLP evaluation for cognition and memory." ST was ordered by Dr. Alfonso Patten. Tat.      PAIN:  Are you having pain? No  PATIENT GOALS Improve thinking and word finding.  OBJECTIVE:   TODAY'S TREATMENT:  04/23/22 Cognition: (39 minutes): SLP introduced memory strategies with pt, and found that picture was likely her best strategy in "WARM" technique by using pictures and auditory information for her to recall later (2-7 minutes later). Pt will use this when  she has an idea that she feels she needs to capture, and will repeat it to herself audibly as well. Pt also is using a paper calendar and her phone for appointment management.    04/21/22 Swallowing: Pt reports she is swallowing with effort with all POs including meds. SLP reviewed the HEP for dysphagia with rare min A (procedure for [facility]). Speech: SLP had pt practice AB for 3 minutes; pt with fast inhale/exhale cycle. SLP demonstrated for pt more relaxed/longer respiration cycle  and encouraged pt to target longer cycle as she practices AB 15 minutes BID this week.   04/17/22 Swallowing: Pt used applesauce with meds and also after swallowing meds bolus, but she says her compensation of corn chip still works better. Given this, SLP educated pt today on four swallow exercises - chin tuck against resistance, effortful swallow, Mendelsohn, and Masako. SLP used demo, written handout and verbal cues for teaching these to pt and pt return demonstrated each correctly before SLP moved on to next exercise. SLP also told pt to swallow with effort during liquid POs and when she thinks about it with solid POs. Speech: SLP reviewed AB with pt using demo and verbal cues. Pt with 2 minutes AB trial and observed with 80% AB. Additional 3 minute trial with 85% success with rare min A. SLP provided rationale for AB (again) for pt and discussed physiology of what occurs with AB. SLP believes pt will do well to practice 7-10 minutes BID this weekend and then beginning Monday incr to 15 minutes BID. Pt agrees with this See "pt instructions" note same date as this note for more details about HEP for AB and for dysphagia.  04/13/22 Cognition (34 minutes): CLQT concluded today. Pt scored within normal limits on all domains however decr'd ability was skillfully noted by SLP today with attention to detail (pt filled out PROM and halfway through second page realized she was answering for activities occurring in the LAST 7 DAYS, Alexandria Wallace repeated 3 designs on Design Generation task, req'd double check to find all symbols in a scanning task). She reports attention changes in everyday tasks such as forgetting an idea she had while engaging in another high-attention task such as driving. On PROM listed above, pt mentioned biggest difficulties with slower reaction and thinking, and with memory and attention (selective, sustained, divided).  Swallowing (8 minutes): SLP used skilled question asking to ascertain best  "tricks" pt uses currently for med administration - reducing number of meds/time to 3, and using a corn chip as a "plunger" to assist in clearance. Today SLP explained in detail pt to use dollop of applesauce or some other puree to aid in pharyngeal transit, and incr'd strength of swallow.  PATIENT EDUCATION: Education details: see 04/17/22 note for details Person educated: Patient Education method: Explanation, Demonstration, Verbal cues, and Handouts Education comprehension: verbalized understanding, returned demonstration, and needs further education   HOME EXERCISE PROGRAM:  For dysphagia and AB -see "pt instructions" note same date as this note under "chart review"-> "notes".     GOALS: Goals reviewed with patient? Yes  SHORT TERM GOALS: Target date: 05/05/2022   Pt will complete CLQT in first session Baseline: Goal status: Achieved   2.  Pt will use 80% abdominal breathing at rest in 2 sessions Baseline:  Goal status: Ongoing  3.  Pt will demo selective attention in mod busy environment for 8 minutes of functional mod complex tasks in 2 sessions Baseline:  Goal status: Ongoing  4.  Pt will state 3 compensations for attention she could use Baseline:  Goal status: Ongoing  5.  Pt will report successful pill clearance using compensations between 2 sessions Baseline:  Goal status: Ongoing  6.  Pt complete dysphagia HEP with modified independence in 2 sessions Baseline:  Goal status: Ongoing   LONG TERM GOALS: Target date: 06/30/2022  (pt to go on three week vacation so date was extended)  Pt will use 80% abdominal breathing at rest in 2 sessions Baseline:  Goal status: Ongoing  2.  Pt will demo selective attention in mod busy environment for 15 minutes of functional mod complex tasks in 3 sessions Baseline:  Goal status: Ongoing  3. Pt will state usage of 2 compensations for attention between 3 sessions Baseline:  Goal status: Ongoing  4. Pt will complete tasks  for anomia (VNEST, SFA) independently in 2 sessions Baseline:  Goal status: Ongoing  5  Pt will score an improved PROM in the last session Baseline:  Goal status: Ongoing    ASSESSMENT:  CLINICAL IMPRESSION: Alexandria Wallace continues in skilled ST for cognitive communication deficits, dysphagia, decr'd breath support for speech (dysarthria), and sx's of aphasia. Alexandria Wallace learned dysphagia exercises today and cont'd work on AB, which she will begin doing with more practice at home. Pt would cont to benefit from skilled ST targeting the goals above.  OBJECTIVE IMPAIRMENTS  Objective impairments include attention, aphasia, dysarthria, and dysphagia. These impairments are limiting patient from household responsibilities, effectively communicating at home and in community, and safety when swallowing.Factors affecting potential to achieve goals and functional outcome are medical prognosis.. Patient will benefit from skilled SLP services to address above impairments and improve overall function.  REHAB POTENTIAL: Excellent  PLAN: SLP FREQUENCY: 2x/week for 4 weeks, then x1/week x4 weeks  SLP DURATION: 12 weeks (pt to be on 3-week vacation in the middle of ST plan of care)  PLANNED INTERVENTIONS: Aspiration precaution training, Language facilitation, Environmental controls, Cueing hierachy, Cognitive reorganization, Internal/external aids, Functional tasks, SLP instruction and feedback, Compensatory strategies, Patient/family education, and HEP for loud /a/.    [place], [place] 04/23/2022, 11:05 AM

## 2022-04-28 ENCOUNTER — Ambulatory Visit: Payer: Medicare PPO

## 2022-04-28 DIAGNOSIS — M6281 Muscle weakness (generalized): Secondary | ICD-10-CM | POA: Diagnosis not present

## 2022-04-28 DIAGNOSIS — R2681 Unsteadiness on feet: Secondary | ICD-10-CM | POA: Diagnosis not present

## 2022-04-28 DIAGNOSIS — R471 Dysarthria and anarthria: Secondary | ICD-10-CM | POA: Diagnosis not present

## 2022-04-28 DIAGNOSIS — R1310 Dysphagia, unspecified: Secondary | ICD-10-CM

## 2022-04-28 DIAGNOSIS — R4701 Aphasia: Secondary | ICD-10-CM | POA: Diagnosis not present

## 2022-04-28 DIAGNOSIS — R41841 Cognitive communication deficit: Secondary | ICD-10-CM

## 2022-04-28 DIAGNOSIS — R29818 Other symptoms and signs involving the nervous system: Secondary | ICD-10-CM | POA: Diagnosis not present

## 2022-04-28 DIAGNOSIS — R278 Other lack of coordination: Secondary | ICD-10-CM | POA: Diagnosis not present

## 2022-04-28 DIAGNOSIS — R2689 Other abnormalities of gait and mobility: Secondary | ICD-10-CM | POA: Diagnosis not present

## 2022-04-28 DIAGNOSIS — F310 Bipolar disorder, current episode hypomanic: Secondary | ICD-10-CM | POA: Diagnosis not present

## 2022-04-28 NOTE — Therapy (Signed)
OUTPATIENT SPEECH LANGUAGE PATHOLOGY TREATMENT NOTE  Patient Name: Alexandria Wallace MRN: [id number] DOB:1941/07/11, 81 y.o., female Today's Date: 04/28/2022  PCP: Theadore Nan REFERRING PROVIDER: Alonza Bogus, DO   End of Session - 04/28/22 1106     Visit Number 6    Number of Visits 13    Date for SLP Re-Evaluation 06/30/22    Authorization Type humana    Authorization - Visit Number 5    Authorization - Number of Visits 12    SLP Start Time 7829    SLP Stop Time  1100    SLP Time Calculation (min) 32 min    Activity Tolerance Patient tolerated treatment well                 Past Medical History:  Diagnosis Date   Colon polyp    High cholesterol    Mood disorder [facility])    Past Surgical History:  Procedure Laterality Date   COLECTOMY     EUS N/A 11/29/2015   Procedure: LOWER ENDOSCOPIC ULTRASOUND (EUS);  Surgeon: Arta Silence, MD;  Location: [facility] ENDOSCOPY;  Service: Endoscopy;  Laterality: N/A;   FLEXIBLE SIGMOIDOSCOPY N/A 01/27/2018   Procedure: FLEXIBLE SIGMOIDOSCOPY;  Surgeon: Otis Brace, MD;  Location: [facility] ENDOSCOPY;  Service: Gastroenterology;  Laterality: N/A;   LEFT HEART CATH AND CORONARY ANGIOGRAPHY N/A 11/15/2018   Procedure: LEFT HEART CATH AND CORONARY ANGIOGRAPHY;  Surgeon: Troy Sine, MD;  Location: [place] CV LAB;  Service: Cardiovascular;  Laterality: N/A;   POLYPECTOMY  02/19/2016   Patient Active Problem List   Diagnosis Date Noted   Parkinson's disease ([place]) 02/04/2022   Hypotension    Elevated troponin    Nausea    Nausea and vomiting    Septic shock ([place]) 06/05/2020   AKI (acute kidney injury) ([place])    Metabolic acidosis, increased anion gap    Dyspnea    Agatston coronary artery calcium score between 100 and 199    Murmur 11/11/2016   Mixed hyperlipidemia 11/11/2016    ONSET DATE: 2019 (sx), March 2023 (dx)  REFERRING DIAG: Parkinson's Disease  THERAPY DIAG:  Cognitive communication deficit  Dysphagia,  unspecified type  Aphasia  Dysarthria and anarthria  SUBJECTIVE:   SUBJECTIVE STATEMENT: "I can't do (Mendelsohn" well so I don't do it."  Pt accompanied by: self  PERTINENT HISTORY: 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 Brain imaging (02/24/22): "No evidence of acute intracranial abnormality. Mild generalized cerebral atrophy." Pt was referred for PT/OT, and OT note from 03/30/22 states "Recently she has been mentioning some concerns with her thought processes and memory.  She also demonstrates some decreased working memory and increased difficulty with dual tasking during therapy sessions.The patient would benefit from SLP evaluation for cognition and memory." ST was ordered by Dr. Alfonso Patten. Tat.      PAIN:  Are you having pain? No  PATIENT GOALS Improve thinking and word finding.  OBJECTIVE:   TODAY'S TREATMENT:  04/28/22 Swallowing (17 minutes): Pt unsure of Mendelsohn and was not preforming this exercise. SLP and pt worked through the procedure and eventually pt was successful in x5 reps without SLP cues. "That's easier now," pt stated. SLP asked pt about pill clearance and she stated this was better. She is still feeling 5-10 minutes after pill  administration that (at times) a globus sensation. SLP told pt sounds more like GERD sx, but explained how to keep a simple record of this feeling for better problem solving for MDs. Cognition (13 minutes): Pt told SLP 3 compensations for attention/attention to detail, she stated she has done pillbox fill with a card to show her what's needed in each box (compensations), balanced her checkbook, and she has paid her one bill without difficulty.  04/23/22 Cognition: (39 minutes): SLP introduced memory strategies  with pt, and found that picture was likely her best strategy in "WARM" technique by using pictures and auditory information for her to recall later (2-7 minutes later). Pt will use this when she has an idea that she feels she needs to capture, and will repeat it to herself audibly as well. Pt also is using a paper calendar and her phone for appointment management.   04/21/22 Swallowing: Pt reports she is swallowing with effort with all POs including meds. SLP reviewed the HEP for dysphagia with rare min A (procedure for [facility]). Speech: SLP had pt practice AB for 3 minutes; pt with fast inhale/exhale cycle. SLP demonstrated for pt more relaxed/longer respiration cycle and encouraged pt to target longer cycle as she practices AB 15 minutes BID this week.   04/17/22 Swallowing: Pt used applesauce with meds and also after swallowing meds bolus, but she says her compensation of corn chip still works better. Given this, SLP educated pt today on four swallow exercises - chin tuck against resistance, effortful swallow, Mendelsohn, and Masako. SLP used demo, written handout and verbal cues for teaching these to pt and pt return demonstrated each correctly before SLP moved on to next exercise. SLP also told pt to swallow with effort during liquid POs and when she thinks about it with solid POs. Speech: SLP reviewed AB with pt using demo and verbal cues. Pt with 2 minutes AB trial and observed with 80% AB. Additional 3 minute trial with 85% success with rare min A. SLP provided rationale for AB (again) for pt and discussed physiology of what occurs with AB. SLP believes pt will do well to practice 7-10 minutes BID this weekend and then beginning Monday incr to 15 minutes BID. Pt agrees with this See "pt instructions" note same date as this note for more details about HEP for AB and for dysphagia.  PATIENT EDUCATION: Education details: see 04/28/22 note for details Person educated: Patient Education method:  Explanation, Demonstration, Verbal cues Education comprehension: verbalized understanding, returned demonstration, and needs further education   HOME EXERCISE PROGRAM:  N/A today.     GOALS: Goals reviewed with patient? Yes  SHORT TERM GOALS: Target date: 05/05/2022   Pt will complete CLQT in first session Baseline: Goal status: Achieved   2.  Pt will use 80% abdominal breathing at rest in 2 sessions Baseline: 04/28/22 Goal status: Ongoing  3.  Pt will demo selective attention in mod busy environment for 8 minutes of functional mod complex tasks in 2 sessions Baseline:  Goal status: Ongoing  4.  Pt will state 3 compensations for attention she could use Baseline: 04/28/22 Goal status: Ongoing  5.  Pt will report successful pill clearance using compensations between 2 sessions Baseline: 04/28/22 Goal status: Ongoing  6.  Pt complete dysphagia HEP with modified independence in 2 sessions Baseline:  Goal status: Ongoing   LONG TERM GOALS: Target date: 06/30/2022  (pt to go on three week vacation so date was extended)  Pt will use 80%  abdominal breathing at rest in 2 sessions Baseline:  Goal status: Ongoing  2.  Pt will demo selective attention in mod busy environment for 15 minutes of functional mod complex tasks in 3 sessions Baseline:  Goal status: Ongoing  3. Pt will state usage of 2 compensations for attention between 3 sessions Baseline:  Goal status: Ongoing  4. Pt will complete tasks for anomia (VNEST, SFA) independently in 2 sessions Baseline:  Goal status: Ongoing  5  Pt will score an improved PROM in the last session Baseline:  Goal status: Ongoing    ASSESSMENT:  CLINICAL IMPRESSION: Teretha continues in skilled ST for cognitive communication deficits, dysphagia, decr'd breath support for speech (dysarthria), and sx's of aphasia. Jana Half reviewed [place] today and cont'd work on AB, which she will begin doing with more practice at home. Pt would  cont to benefit from skilled ST targeting the goals above.  OBJECTIVE IMPAIRMENTS  Objective impairments include attention, aphasia, dysarthria, and dysphagia. These impairments are limiting patient from household responsibilities, effectively communicating at home and in community, and safety when swallowing.Factors affecting potential to achieve goals and functional outcome are medical prognosis.. Patient will benefit from skilled SLP services to address above impairments and improve overall function.  REHAB POTENTIAL: Excellent  PLAN: SLP FREQUENCY: 2x/week for 4 weeks, then x1/week x4 weeks  SLP DURATION: 12 weeks (pt to be on 3-week vacation in the middle of ST plan of care)  PLANNED INTERVENTIONS: Aspiration precaution training, Language facilitation, Environmental controls, Cueing hierachy, Cognitive reorganization, Internal/external aids, Functional tasks, SLP instruction and feedback, Compensatory strategies, Patient/family education, and HEP for loud /a/.    [place], [place] 04/28/2022, 11:07 AM

## 2022-05-26 ENCOUNTER — Other Ambulatory Visit: Payer: Self-pay

## 2022-05-26 ENCOUNTER — Ambulatory Visit: Payer: Medicare PPO | Attending: Neurology

## 2022-05-26 DIAGNOSIS — R4701 Aphasia: Secondary | ICD-10-CM | POA: Insufficient documentation

## 2022-05-26 DIAGNOSIS — R471 Dysarthria and anarthria: Secondary | ICD-10-CM | POA: Diagnosis not present

## 2022-05-26 DIAGNOSIS — R41841 Cognitive communication deficit: Secondary | ICD-10-CM | POA: Insufficient documentation

## 2022-05-26 DIAGNOSIS — R1310 Dysphagia, unspecified: Secondary | ICD-10-CM | POA: Diagnosis not present

## 2022-05-26 NOTE — Therapy (Signed)
OUTPATIENT SPEECH LANGUAGE PATHOLOGY TREATMENT NOTE  Patient Name: Alexandria Wallace MRN: [id number] DOB:[birth year], 81 y.o., female Today's Date: 05/26/2022  PCP: Selena Batten REFERRING PROVIDER: Kerin Salen, DO   End of Session - 05/26/22 1319     Visit Number 7    Number of Visits 13    Date for SLP Re-Evaluation 06/30/22    Authorization Type humana    Authorization - Visit Number 6    Authorization - Number of Visits 12    SLP Start Time 1020    SLP Stop Time  1100    SLP Time Calculation (min) 40 min    Activity Tolerance Patient tolerated treatment well                  Past Medical History:  Diagnosis Date   Colon polyp    High cholesterol    Mood disorder [facility])    Past Surgical History:  Procedure Laterality Date   COLECTOMY     EUS N/A 11/29/2015   Procedure: LOWER ENDOSCOPIC ULTRASOUND (EUS);  Surgeon: Willis Modena, MD;  Location: [facility] ENDOSCOPY;  Service: Endoscopy;  Laterality: N/A;   FLEXIBLE SIGMOIDOSCOPY N/A 01/27/2018   Procedure: FLEXIBLE SIGMOIDOSCOPY;  Surgeon: Kathi Der, MD;  Location: [facility] ENDOSCOPY;  Service: Gastroenterology;  Laterality: N/A;   LEFT HEART CATH AND CORONARY ANGIOGRAPHY N/A 11/15/2018   Procedure: LEFT HEART CATH AND CORONARY ANGIOGRAPHY;  Surgeon: Lennette Bihari, MD;  Location: [facility] INVASIVE CV LAB;  Service: Cardiovascular;  Laterality: N/A;   POLYPECTOMY  02/19/2016   Patient Active Problem List   Diagnosis Date Noted   Parkinson's disease ([facility]) 02/04/2022   Hypotension    Elevated troponin    Nausea    Nausea and vomiting    Septic shock ([facility]) 06/05/2020   AKI (acute kidney injury) ([facility])    Metabolic acidosis, increased anion gap    Dyspnea    Agatston coronary artery calcium score between 100 and 199    Murmur 11/11/2016   Mixed hyperlipidemia 11/11/2016    ONSET DATE: 2019 (sx), March 2023 (dx)  REFERRING DIAG: Parkinson's Disease  THERAPY DIAG:  Dysphagia, unspecified type  Cognitive  communication deficit  Aphasia  Dysarthria and anarthria  SUBJECTIVE:   SUBJECTIVE STATEMENT: "I enjoyed [place]."  Pt accompanied by: self  PERTINENT HISTORY: 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 Brain imaging (02/24/22): "No evidence of acute intracranial abnormality. Mild generalized cerebral atrophy." Pt was referred for PT/OT, and OT note from 03/30/22 states "Recently she has been mentioning some concerns with her thought processes and memory.  She also demonstrates some decreased working memory and increased difficulty with dual tasking during therapy sessions.The patient would benefit from SLP evaluation for cognition and memory." ST was ordered by Dr. Elvera Lennox. Tat.      PAIN:  Are you having pain? No  PATIENT GOALS Improve thinking and word finding.  OBJECTIVE:   TODAY'S TREATMENT:  05/26/22: Pt reports her pattern of decr'd memory seems intermittent and not consistent - and right now, for the last month pt has not had instance of decr'd memory more than once. Pt performed a task for 10 minutes in a mod noisy environment (gym noise with open door to ST room) without difficulty. SLP took 13 minutes and educated pt in VNeST  and SFA; pt took copies of each task to perform at home twice a week. Pt states she has not been as consistent with the swallow HEP on vacation as she was at home. She stated she is comfortable waiting until next session to go over these exercises. SLP and pt agreed pt is at this point she could decr to once/week due to progress.  04/28/22 Swallowing (17 minutes): Pt unsure of Mendelsohn and was not preforming this exercise. SLP and pt worked through the procedure and eventually pt was successful in x5 reps without SLP cues.  "That's easier now," pt stated. SLP asked pt about pill clearance and she stated this was better. She is still feeling 5-10 minutes after pill administration that (at times) a globus sensation. SLP told pt sounds more like GERD sx, but explained how to keep a simple record of this feeling for better problem solving for MDs. Cognition (13 minutes): Pt told SLP 3 compensations for attention/attention to detail, she stated she has done pillbox fill with a card to show her what's needed in each box (compensations), balanced her checkbook, and she has paid her one bill without difficulty.  04/23/22 Cognition: (39 minutes): SLP introduced memory strategies with pt, and found that picture was likely her best strategy in "WARM" technique by using pictures and auditory information for her to recall later (2-7 minutes later). Pt will use this when she has an idea that she feels she needs to capture, and will repeat it to herself audibly as well. Pt also is using a paper calendar and her phone for appointment management.   04/21/22 Swallowing: Pt reports she is swallowing with effort with all POs including meds. SLP reviewed the HEP for dysphagia with rare min A (procedure for [facility]). Speech: SLP had pt practice AB for 3 minutes; pt with fast inhale/exhale cycle. SLP demonstrated for pt more relaxed/longer respiration cycle and encouraged pt to target longer cycle as she practices AB 15 minutes BID this week.    PATIENT EDUCATION: Education details: see today's therapy note for details Person educated: Patient Education method: Explanation, Demonstration, Verbal cues Education comprehension: verbalized understanding, returned demonstration, and needs further education   HOME EXERCISE PROGRAM:  SFA and VNeST     GOALS: Goals reviewed with patient? Yes  SHORT TERM GOALS: Target date: 05/05/2022   Pt will complete CLQT in first session Baseline: Goal status: Achieved   2.  Pt will use 80%  abdominal breathing at rest in 2 sessions Baseline: 04/28/22, 05-26-22 Goal status: Met  3.  Pt will demo selective attention in mod busy environment for 8 minutes of functional mod complex tasks in 2 sessions Baseline:  Goal status: Partially Met  4.  Pt will state 3 compensations for attention she could use Baseline: 04/28/22 Goal status: Met  5.  Pt will report successful pill clearance using compensations between 2 sessions Baseline: 04/28/22 Goal status: Met  6.  Pt complete dysphagia HEP with modified independence in 2 sessions Baseline:  Goal status: Partially met   LONG TERM GOALS: Target date: 06/30/2022  (pt to go on three week vacation so date was extended)  Pt will use 80% abdominal breathing at rest in 2 sessions Baseline:  Goal status: Ongoing  2.  Pt will demo selective attention in mod busy environment for 15 minutes of functional mod complex tasks in 3 sessions Baseline:  Goal status: Ongoing  3. Pt will state usage of 2 compensations for attention between 3 sessions Baseline:  Goal status: Ongoing  4. Pt will complete tasks for anomia (VNEST, SFA) independently in 2 sessions Baseline:  Goal status: Ongoing  5  Pt will score an improved PROM in the last session Baseline:  Goal status: Ongoing    ASSESSMENT:  CLINICAL IMPRESSION: Darrin continues in skilled ST for cognitive communication deficits, dysphagia, decr'd breath support for speech (dysarthria), and sx's of aphasia. Pt would cont to benefit from skilled ST targeting the goals above. Agreed she could decr to once/week at this time.  OBJECTIVE IMPAIRMENTS  Objective impairments include attention, aphasia, dysarthria, and dysphagia. These impairments are limiting patient from household responsibilities, effectively communicating at home and in community, and safety when swallowing.Factors affecting potential to achieve goals and functional outcome are medical prognosis.. Patient will benefit from  skilled SLP services to address above impairments and improve overall function.  REHAB POTENTIAL: Excellent  PLAN: SLP FREQUENCY: 1x/week for remaining sessions  SLP DURATION: 12 weeks (pt to be on 3-week vacation in the middle of ST plan of care)  PLANNED INTERVENTIONS: Aspiration precaution training, Language facilitation, Environmental controls, Cueing hierachy, Cognitive reorganization, Internal/external aids, Functional tasks, SLP instruction and feedback, Compensatory strategies, Patient/family education, and HEP for loud /a/.    Melaine Mcphee, CCC-SLP 05/26/2022, 1:19 PM

## 2022-05-28 ENCOUNTER — Telehealth: Payer: Self-pay | Admitting: Internal Medicine

## 2022-05-28 NOTE — Telephone Encounter (Signed)
Patient requesting to switch from Dr. Debara Pickett to Dr. Harrell Gave.

## 2022-06-01 ENCOUNTER — Ambulatory Visit: Payer: Medicare PPO | Attending: Neurology

## 2022-06-01 DIAGNOSIS — R41841 Cognitive communication deficit: Secondary | ICD-10-CM | POA: Diagnosis not present

## 2022-06-01 DIAGNOSIS — R1310 Dysphagia, unspecified: Secondary | ICD-10-CM | POA: Insufficient documentation

## 2022-06-01 DIAGNOSIS — R4701 Aphasia: Secondary | ICD-10-CM | POA: Insufficient documentation

## 2022-06-01 DIAGNOSIS — R471 Dysarthria and anarthria: Secondary | ICD-10-CM | POA: Insufficient documentation

## 2022-06-01 NOTE — Telephone Encounter (Signed)
Ok by me

## 2022-06-01 NOTE — Therapy (Signed)
OUTPATIENT SPEECH LANGUAGE PATHOLOGY TREATMENT NOTE/DISCHARGE SUMMARY  Patient Name: Alexandria Wallace MRN: [id number] DOB:08/11/1941, 81 y.o., female Today's Date: 06/01/2022  PCP: Theadore Nan REFERRING PROVIDER: Tat, Wells Guiles, DO  SPEECH THERAPY DISCHARGE SUMMARY  Visits from Start of Care: 8  Current functional level related to goals / functional outcomes: See below.   Remaining deficits: Cognitive communication deficit (memory), dysarthria and anarthria, dysphagia.   Education / Equipment: See below.   Patient agrees to discharge. Patient goals were partially met. Patient is being discharged due to the patient's request..      End of Session - 06/01/22 1035     Visit Number 8    Number of Visits 13    Date for SLP Re-Evaluation 06/30/22    Authorization Type humana    Authorization - Visit Number 7    Authorization - Number of Visits 12    SLP Start Time 1020    SLP Stop Time  1050    SLP Time Calculation (min) 30 min    Activity Tolerance Patient tolerated treatment well                   Past Medical History:  Diagnosis Date   Colon polyp    High cholesterol    Mood disorder ([place])    Past Surgical History:  Procedure Laterality Date   COLECTOMY     EUS N/A 11/29/2015   Procedure: LOWER ENDOSCOPIC ULTRASOUND (EUS);  Surgeon: Arta Silence, MD;  Location: [facility] ENDOSCOPY;  Service: Endoscopy;  Laterality: N/A;   FLEXIBLE SIGMOIDOSCOPY N/A 01/27/2018   Procedure: FLEXIBLE SIGMOIDOSCOPY;  Surgeon: Otis Brace, MD;  Location: [facility] ENDOSCOPY;  Service: Gastroenterology;  Laterality: N/A;   LEFT HEART CATH AND CORONARY ANGIOGRAPHY N/A 11/15/2018   Procedure: LEFT HEART CATH AND CORONARY ANGIOGRAPHY;  Surgeon: Troy Sine, MD;  Location: [facility];  Service: Cardiovascular;  Laterality: N/A;   POLYPECTOMY  02/19/2016   Patient Active Problem List   Diagnosis Date Noted   Parkinson's disease ([place]) 02/04/2022   Hypotension    Elevated  troponin    Nausea    Nausea and vomiting    Septic shock ([place]) 06/05/2020   AKI (acute kidney injury) ([place])    Metabolic acidosis, increased anion gap    Dyspnea    [place]atston coronary artery calcium score between 100 and 199    Murmur 11/11/2016   Mixed hyperlipidemia 11/11/2016    ONSET DATE: 2019 (sx), March 2023 (dx)  REFERRING DIAG: Parkinson's Disease  THERAPY DIAG:  Dysphagia, unspecified type  Aphasia  Cognitive communication deficit  Dysarthria and anarthria  SUBJECTIVE:   SUBJECTIVE STATEMENT: "I wondered if this could be our last visit."  "We've done what we need to do, and I'm comfortable continuing this at home."  Pt accompanied by: self  PERTINENT HISTORY: 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 Brain imaging (02/24/22): "No evidence of acute intracranial abnormality. Mild generalized cerebral atrophy." Pt was referred for PT/OT, and OT note from 03/30/22 states "Recently she has been mentioning some concerns with her thought processes and memory.  She also demonstrates some decreased working memory and increased difficulty with dual tasking during therapy sessions.The patient would benefit from SLP evaluation for cognition and memory." ST was ordered by Dr. Alfonso Patten. Tat.  PAIN:  Are you having pain? No  PATIENT GOALS Improve thinking and word finding.  OBJECTIVE:   TODAY'S TREATMENT:  06/01/22: Swallow tx: Pt reports globus is less frequent and less intense than reported on 04/28/22. She is not completing HEP to recommended frequency. SLP told pt at least x5/week and to cont to swallow all POs with effort. She req'd min A 50% of the time - hold times on [place] and CTAR. SLP provided handout for hold times.  Cognitive tx  (10 minutes): SLP asked how pt was going to remember to do the exercises and she stated she has the dysphagia exercises (and also SFA and VNeST) on a card so she could remember to complete them. Jakeisha told SLP the challenge for completion is motivation based and not memory based. SLP provided rationale for all of these tasks; Pt repeated back 5 minutes later. Sherl inquired if today could be the last ST session for this therapy course; SLP verified pt is comfortable completing HEP and compensations at home at this time and she assured SLP that she is. SLP agreed with d/c.   05/26/22: Pt reports her pattern of decr'd memory seems intermittent and not consistent - and right now, for the last month pt has not had instance of decr'd memory more than once. Pt performed a task for 10 minutes in a mod noisy environment (gym noise with open door to [place] room) without difficulty. SLP took 13 minutes and educated pt in VNeST and SFA; pt took copies of each task to perform at home twice a week. Pt states she has not been as consistent with the swallow HEP on vacation as she was at home. She stated she is comfortable waiting until next session to go over these exercises. SLP and pt agreed pt is at this point she could decr to once/week due to progress.  04/28/22 Swallowing (17 minutes): Pt unsure of [place] and was not preforming this exercise. SLP and pt worked through the procedure and eventually pt was successful in x5 reps without SLP cues. "That's easier now," pt stated. SLP asked pt about pill clearance and she stated this was better. She is still feeling 5-10 minutes after pill administration that (at times) a globus sensation. SLP told pt sounds more like GERD sx, but explained how to keep a simple record of this feeling for better problem solving for MDs. Cognition (13 minutes): Pt told SLP 3 compensations for attention/attention to detail, she stated she has done pillbox fill with a card to show her what's  needed in each box (compensations), balanced her checkbook, and she has paid her one bill without difficulty.  04/23/22 Cognition: (39 minutes): SLP introduced memory strategies with pt, and found that picture was likely her best strategy in "WARM" technique by using pictures and auditory information for her to recall later (2-7 minutes later). Pt will use this when she has an idea that she feels she needs to capture, and will repeat it to herself audibly as well. Pt also is using a paper calendar and her phone for appointment management.   04/21/22 Swallowing: Pt reports she is swallowing with effort with all POs including meds. SLP reviewed the HEP for dysphagia with rare min A (procedure for [facility]). Speech: SLP had pt practice AB for 3 minutes; pt with fast inhale/exhale cycle. SLP demonstrated for pt more relaxed/longer respiration cycle and encouraged pt to target longer cycle as she practices AB 15 minutes BID this week.    PATIENT  EDUCATION: Education details: see today's therapy note for details Person educated: Patient Education method: Explanation, Demonstration, Verbal cues Education comprehension: verbalized understanding, returned demonstration, and needs further education   HOME EXERCISE PROGRAM:  SFA and VNeST     GOALS: Goals reviewed with patient? Yes  SHORT TERM GOALS: Target date: 05/05/2022   Pt will complete CLQT in first session Baseline: Goal status: Achieved   2.  Pt will use 80% abdominal breathing at rest in 2 sessions Baseline: 04/28/22, 05-26-22 Goal status: Met  3.  Pt will demo selective attention in mod busy environment for 8 minutes of functional mod complex tasks in 2 sessions Baseline:  Goal status: Partially Met  4.  Pt will state 3 compensations for attention she could use Baseline: 04/28/22 Goal status: Met  5.  Pt will report successful pill clearance using compensations between 2 sessions Baseline: 04/28/22 Goal status: Met  6.  Pt  complete dysphagia HEP with modified independence in 2 sessions Baseline:  Goal status: Partially met   LONG TERM GOALS: Target date: 06/30/2022  (pt to go on three week vacation so date was extended)  Pt will use 80% abdominal breathing at rest in 2 sessions Baseline:  Goal status: Unable to assess  2.  Pt will demo selective attention in mod busy environment for 15 minutes of functional mod complex tasks in 3 sessions Baseline:  Goal status: Unable to assess  3. Pt will state usage of 2 compensations for attention between 3 sessions Baseline:  Goal status: Unable to assess  4. Pt will complete tasks for anomia (VNEST, SFA) independently in 2 sessions Baseline:  Goal status: Partially met  5  Pt will score an improved PROM in the last session Baseline:  Goal status: Unable to assess - pt indicated preference for d/c at end of session    ASSESSMENT:  CLINICAL IMPRESSION: Katherine continues in skilled ST for cognitive communication deficits, dysphagia, decr'd breath support for speech (dysarthria), and sx's of aphasia. SEE pt's THERAPY NOTE TODAY UNDER "TREATMENT". Pt would like to d/c at this time. SLP agreed after Jana Half assured pt that she was comfortable continuing HEP for dysphagia and and other therapy tasks at home on her own.  OBJECTIVE IMPAIRMENTS  Objective impairments include attention, aphasia, dysarthria, and dysphagia. These impairments are limiting patient from household responsibilities, effectively communicating at home and in community, and safety when swallowing.Factors affecting potential to achieve goals and functional outcome are medical prognosis.. Patient will benefit from skilled SLP services to address above impairments and improve overall function.  REHAB POTENTIAL: Excellent  PLAN: Discharge today. PLANNED INTERVENTIONS: Aspiration precaution training, Language facilitation, Environmental controls, Cueing hierachy, Cognitive reorganization,  Internal/external aids, Functional tasks, SLP instruction and feedback, Compensatory strategies, Patient/family education, and HEP for loud /a/.    [place], [place] 06/01/2022, 10:59 AM

## 2022-06-08 DIAGNOSIS — H40013 Open angle with borderline findings, low risk, bilateral: Secondary | ICD-10-CM | POA: Diagnosis not present

## 2022-06-08 DIAGNOSIS — H04123 Dry eye syndrome of bilateral lacrimal glands: Secondary | ICD-10-CM | POA: Diagnosis not present

## 2022-06-08 DIAGNOSIS — H2513 Age-related nuclear cataract, bilateral: Secondary | ICD-10-CM | POA: Diagnosis not present

## 2022-06-08 DIAGNOSIS — H5203 Hypermetropia, bilateral: Secondary | ICD-10-CM | POA: Diagnosis not present

## 2022-06-08 DIAGNOSIS — H25013 Cortical age-related cataract, bilateral: Secondary | ICD-10-CM | POA: Diagnosis not present

## 2022-07-16 ENCOUNTER — Ambulatory Visit (HOSPITAL_BASED_OUTPATIENT_CLINIC_OR_DEPARTMENT_OTHER): Payer: Medicare PPO | Admitting: Cardiology

## 2022-07-16 ENCOUNTER — Encounter (HOSPITAL_BASED_OUTPATIENT_CLINIC_OR_DEPARTMENT_OTHER): Payer: Self-pay | Admitting: Cardiology

## 2022-07-16 VITALS — BP 100/65 | HR 70 | Ht 64.0 in | Wt 151.5 lb

## 2022-07-16 DIAGNOSIS — T466X5D Adverse effect of antihyperlipidemic and antiarteriosclerotic drugs, subsequent encounter: Secondary | ICD-10-CM | POA: Diagnosis not present

## 2022-07-16 DIAGNOSIS — I2510 Atherosclerotic heart disease of native coronary artery without angina pectoris: Secondary | ICD-10-CM

## 2022-07-16 DIAGNOSIS — E7849 Other hyperlipidemia: Secondary | ICD-10-CM | POA: Diagnosis not present

## 2022-07-16 DIAGNOSIS — E7841 Elevated Lipoprotein(a): Secondary | ICD-10-CM

## 2022-07-16 DIAGNOSIS — M791 Myalgia, unspecified site: Secondary | ICD-10-CM | POA: Diagnosis not present

## 2022-07-16 DIAGNOSIS — Z7189 Other specified counseling: Secondary | ICD-10-CM

## 2022-07-16 NOTE — Progress Notes (Signed)
Cardiology Office Note:    Date:  07/16/2022   ID:  Alexandria Wallace, DOB [birth year], MRN [id number]  PCP:  Cari Caraway, MD  Cardiologist:  Buford Dresser, MD  Referring MD: Cari Caraway, MD   CC: lipid follow up/establish care with me  History of Present Illness:    Alexandria Wallace is a 81 y.o. female with a hx of hyperlipidemia, Parkinson's, who is seen for follow-up. She was previously followed by Dr. Debara Pickett, last seen by him on 09/30/2021 where she was having issues with her legs. 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 Genetic testing was abnormal indicating mutations that were considered variants of unknown significance in APO B, 2 mutations of LP(a) and 1 in [place], which also is associated with elevations in LP(a).  Cardiovascular risk factors: Comorbid conditions: Hyperlipidemia - She believes that the [place] had been the most successful therapy so far (stopped this at least 6 months ago; was on 420 mg once per month). Previously was on Crestor for a long time, but she was concerned about side effects of memory loss. She decided not to go through with the LP(a) clinical trial.  Family history: Her mother died at age 93 of an MI but there is no other coronary disease in the family. Her mother also had hyperlipidemia. Prior cardiac testing and/or incidental findings on other testing (ie coronary calcium): Heart Cath 10/2018, CCTA 2019  As far as she is aware, she has never had issues with chest pain.  Sometimes when she lies down on her left side, she may feel "thumping" palpitations.  May develop shortness of breath with climbing a flight of stairs, but she recovers quickly. No other shortness of breath.  She denies any peripheral edema. No lightheadedness, headaches, syncope, orthopnea, or PND.  Past Medical History:  Diagnosis Date   Colon polyp    High cholesterol    Mood disorder [facility])     Past Surgical History:  Procedure  Laterality Date   COLECTOMY     EUS N/A 11/29/2015   Procedure: LOWER ENDOSCOPIC ULTRASOUND (EUS);  Surgeon: Arta Silence, MD;  Location: [facility] ENDOSCOPY;  Service: Endoscopy;  Laterality: N/A;   FLEXIBLE SIGMOIDOSCOPY N/A 01/27/2018   Procedure: FLEXIBLE SIGMOIDOSCOPY;  Surgeon: Otis Brace, MD;  Location: [facility] ENDOSCOPY;  Service: Gastroenterology;  Laterality: N/A;   LEFT HEART CATH AND CORONARY ANGIOGRAPHY N/A 11/15/2018   Procedure: LEFT HEART CATH AND CORONARY ANGIOGRAPHY;  Surgeon: Troy Sine, MD;  Location: [facility] CV LAB;  Service: Cardiovascular;  Laterality: N/A;   POLYPECTOMY  02/19/2016    Current Medications: Current Outpatient Medications on File Prior to Visit  Medication Sig   acetaminophen (TYLENOL) 500 MG tablet Take 500 mg by mouth every 6 (six) hours as needed (for pain.).   Ascorbic Acid (VITAMIN C) 1000 MG tablet Take 1,000 mg by mouth every evening.   CALCIUM LACTATE PO Take 520 mg by mouth 2 (two) times daily.   carbidopa-levodopa (SINEMET IR) 25-100 MG tablet Take 1 tablet by mouth 3 (three) times daily.   Cholecalciferol (VITAMIN D3) 75 MCG (3000 UT) TABS Take 3,000 Units by mouth daily.   Coenzyme Q10 (CO Q10) 100 MG CAPS Take 100 mg by mouth every evening.    COLLAGEN PO Take 3,000 mg by mouth daily at 12 noon.   FLUoxetine (PROZAC) 10 MG capsule Take 10 mg by mouth daily.   ibandronate (BONIVA) 150 MG tablet 1 tablet   nitroGLYCERIN (  NITROSTAT) 0.4 MG SL tablet 1 tab at onset of chest pain, may repeat once after 5 minutes   Omega-3 Fatty Acids (FISH OIL) 1000 MG CAPS Take 1,000 mg by mouth at bedtime.   PSYLLIUM PO Take 1 tablet by mouth daily at 12 noon.   TURMERIC CURCUMIN PO Take 250 mg by mouth daily at 12 noon.   TURMERIC PO Take 448 mg by mouth every evening.   No current facility-administered medications on file prior to visit.     Allergies:   Statins and Chocolate   Social History   Tobacco Use   Smoking status: Never   Smokeless  tobacco: Never  Vaping Use   Vaping Use: Never used  Substance Use Topics   Alcohol use: Yes    Alcohol/week: 2.0 standard drinks of alcohol    Types: 2 Glasses of wine per week    Comment: occasionaly   Drug use: No    Family History: family history includes Cancer in her father; Cancer (age of onset: 71) in her sister; Heart attack in her mother; Other (age of onset: 48) in her brother.  ROS:   Please see the history of present illness.  Additional pertinent ROS: Constitutional: Negative for chills, fever, night sweats, unintentional weight loss  HENT: Negative for ear pain and hearing loss.   Eyes: Negative for loss of vision and eye pain.  Respiratory: Negative for cough, sputum, wheezing.   Cardiovascular: See HPI. Gastrointestinal: Negative for abdominal pain, melena, and hematochezia.  Genitourinary: Negative for dysuria and hematuria.  Musculoskeletal: Negative for falls and myalgias.  Skin: Negative for itching and rash.  Neurological: Negative for focal weakness, focal sensory changes and loss of consciousness.  Endo/Heme/Allergies: Does not bruise/bleed easily.     EKGs/Labs/Other Studies Reviewed:    The following studies were reviewed today:  Echocardiogram  06/06/2020:  1. Left ventricular ejection fraction, by estimation, is 60 to 65%. The  left ventricle has normal function. The left ventricle has no regional  wall motion abnormalities. There is mild left ventricular hypertrophy.  Left ventricular diastolic parameters  are consistent with Grade I diastolic dysfunction (impaired relaxation).   2. Right ventricular systolic function is normal. The right ventricular  size is normal. There is mildly elevated pulmonary artery systolic  pressure. The estimated right ventricular systolic pressure is 51.7 mmHg.   3. The mitral valve is degenerative. No evidence of mitral valve  regurgitation. No evidence of mitral stenosis.   4. The aortic valve is tricuspid. Aortic  valve regurgitation is not  visualized. Mild aortic valve sclerosis is present, with no evidence of  aortic valve stenosis.   5. The inferior vena cava is dilated in size with <50% respiratory  variability, suggesting right atrial pressure of 15 mmHg.   PHYSIOLOGIC VASCULAR STUDY OF BLE  11/10/2019: FINDINGS: Right:   Resting ankle brachial index:  1.19   Post exercise ABI 0.90   Segmental blood pressure: Upper extremity pressures are symmetric. Appropriate increased the thigh. No significant drop between segments.   Doppler: Segmental Doppler demonstrates triphasic waveforms throughout   Pulse volume recording: Segmental PVR maintained throughout the right lower extremity   Left:   Resting ankle brachial index: 1.22   Post exercise ABI 0.88   Segmental blood pressure: Upper extremity pressures are symmetric. Appropriate increased to the thigh. No significant drop between segments.   Doppler: Segmental Doppler demonstrates triphasic waveforms throughout   Pulse volume recording: Segmental PVR maintained throughout the left lower extremity.  Additional:   IMPRESSION: Resting ABI of the bilateral lower extremity within normal limits, with post exercise ABI demonstrating slight drop to the mild range of arterial occlusive disease.   Segmental exam demonstrates no significant arterial occlusive disease bilateral lower extremities.  Left Heart Cath 11/15/2018: The left ventricular systolic function is normal. LV end diastolic pressure is normal. The left ventricular ejection fraction is 55-65% by visual estimate.   There is evidence for very mild coronary calcification without evidence for coronary obstructive disease.  The patient has a very short left main which immediately gives off a large LAD which wraps around the apex, a ramus intermediate vessel, and a very large dominant left circumflex coronary artery which gives off the PDA and PLA vessels and also  extends to becomes the right coronary artery reaching almost the sinus of Valsalva.  An RV marginal branch is seen arising from this circumflex extension vessel;  in addition there is a very proximal left atrial circumflex branch which also extends and supplies a small RCA like branch.   Hyperdynamic LV function with suggestion of left ventricular hypertrophy.  EF estimate is 60 to 65%.  LVEDP is 14 mmHg.   RECOMMENDATION: Medical therapy.  Optimal blood pressure control with target blood pressure less than 130/80.  If patient cannot tolerate statin therapy or is unwilling to try, consider PCSK9 inhibition with familial hyperlipidemia.  Coronary CT  09/22/2018: IMPRESSION: 1. The patient's coronary artery calcium score is 130, which places the patient in the 65th percentile. This is greater than expected when compared to age and gender matched peers. Calcium seen in the left circumflex artery.   2. 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 severe proximal RCA stenosis in a nondominant, diminutive vessel. Left dominant with collateral flow from left to right. Diminutive proximal RCA with reconstitution at the mid RCA by an anomalous RV branch and left sided collaterals.   3. Mild CAD in the left circumflex artery (25-49% stenosis), CADRADS = 2.   4. 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  EKG:  EKG is personally reviewed.   07/16/2022:  NSR at 70 bpm  Recent Labs: No results found for requested labs within last 365 days.   Recent Lipid Panel    Component Value Date/Time   CHOL 244 (H) 07/02/2021 0845   TRIG 73 07/02/2021 0845   HDL 71 07/02/2021 0845   CHOLHDL 3.4 07/02/2021 0845   LDLCALC 161 (H) 07/02/2021 0845    Physical Exam:    VS:  BP 100/65 (BP Location: Right Arm, Patient Position: Sitting, Cuff Size: Large)   Pulse 70   Ht '5\' 4"'$  (1.626 m)   Wt 151 lb 8 oz (68.7 kg)   BMI 26.00 kg/m     Wt Readings from Last 3  Encounters:  07/16/22 151 lb 8 oz (68.7 kg)  02/04/22 147 lb 12.8 oz (67 kg)  09/30/21 143 lb 9.6 oz (65.1 kg)    GEN: Well nourished, well developed in no acute distress HEENT: Normal, moist mucous membranes NECK: No JVD CARDIAC: regular rhythm, normal S1 and S2, no rubs or gallops. 1/6 soft systolic murmur. VASCULAR: Radial and DP pulses 2+ bilaterally. No carotid bruits RESPIRATORY:  Clear to auscultation without rales, wheezing or rhonchi  ABDOMEN: Soft, non-tender, non-distended MUSCULOSKELETAL:  Ambulates independently SKIN: Warm and dry, no edema NEUROLOGIC:  Alert and oriented x 3. No focal neuro deficits noted. PSYCHIATRIC:  Normal affect    ASSESSMENT:  1. Nonobstructive atherosclerosis of coronary artery   2. Familial hyperlipidemia   3. Myalgia due to statin   4. Elevated lipoprotein(a)   5. Cardiac risk counseling   6. Counseling on health promotion and disease prevention    PLAN:    Hyperlipidemia Nonobstructive CAD -elevated lp(a) of 328, declined clinical trial -elevated LDL, last 196. This is consistent with familial hypercholesterolemia -genetic testing with VUS in ApoB, lp(a), SLC Z2A1 -statin intolerant due to myalgia -previously on repatha. Per Dr. Lysbeth Penner notes, she stopped due to leg aching. She does not recall this and would like to re-trial. She was on 420 mg dose of repatha--will discuss with PharmD team.  Cardiac risk counseling and prevention recommendations: -recommend heart healthy/Mediterranean diet, with whole grains, fruits, vegetable, fish, lean meats, nuts, and olive oil. Limit salt. -recommend moderate walking, 3-5 times/week for 30-50 minutes each session. Aim for at least 150 minutes.week. Goal should be pace of 3 miles/hours, or walking 1.5 miles in 30 minutes -recommend avoidance of tobacco products. Avoid excess alcohol.  Plan for follow up: 6 months or sooner as needed.  Buford Dresser, MD, PhD, [facility]    Medication Adjustments/Labs and Tests Ordered: Current medicines are reviewed at length with the patient today.  Concerns regarding medicines are outlined above.   Orders Placed This Encounter  Procedures   EKG 12-Lead   No orders of the defined types were placed in this encounter.  Patient Instructions  Medication Instructions:  Your Physician recommend you continue on your current medication as directed.    *If you need a refill on your cardiac medications before your next appointment, please call your pharmacy*   Lab Work: None ordered today   Testing/Procedures: None ordered today   Follow-Up: At [facility], you and your health needs are our priority.  As part of our continuing mission to provide you with exceptional heart care, we have created designated Provider Care Teams.  These Care Teams include your primary Cardiologist (physician) and Advanced Practice Providers (APPs -  Physician Assistants and Nurse Practitioners) who all work together to provide you with the care you need, when you need it.  We recommend signing up for the patient portal called "MyChart".  Sign up information is provided on this After Visit Summary.  MyChart is used to connect with patients for Virtual Visits (Telemedicine).  Patients are able to view lab/test results, encounter notes, upcoming appointments, etc.  Non-urgent messages can be sent to your provider as well.   To learn more about what you can do with MyChart, go to [email or web address].    Your next appointment:   6 month(s)  The format for your next appointment:   In Person  Provider:   Buford Dresser, MD{           I,Mathew Stumpf,acting as a scribe for Buford Dresser, MD.,have documented all relevant documentation on the behalf of Buford Dresser, MD,as directed by  Buford Dresser, MD while in the presence of Buford Dresser, MD.  I, Buford Dresser, MD, have  reviewed all documentation for this visit. The documentation on 08/18/22 for the exam, diagnosis, procedures, and orders are all accurate and complete.   Signed, Buford Dresser, MD PhD 07/16/2022     [place]

## 2022-07-16 NOTE — Patient Instructions (Signed)

## 2022-07-29 ENCOUNTER — Telehealth: Payer: Self-pay | Admitting: Cardiology

## 2022-07-29 NOTE — Telephone Encounter (Signed)
Pt calling for an update on what Dr. Harrell Gave wanted to do about her cholesterol.

## 2022-07-29 NOTE — Telephone Encounter (Signed)
Spoke with patient and she was wanting to know what Dr Harrell Gave wanted to do about her cholesterol after last visit  Will forward to Dr Harrell Gave for review

## 2022-08-05 ENCOUNTER — Ambulatory Visit (INDEPENDENT_AMBULATORY_CARE_PROVIDER_SITE_OTHER): Payer: Medicare PPO | Admitting: Licensed Clinical Social Worker

## 2022-08-05 DIAGNOSIS — G20 Parkinson's disease: Secondary | ICD-10-CM | POA: Diagnosis not present

## 2022-08-05 DIAGNOSIS — Z600 Problems of adjustment to life-cycle transitions: Secondary | ICD-10-CM | POA: Diagnosis not present

## 2022-08-05 NOTE — Telephone Encounter (Signed)
Patient following up please advise

## 2022-08-05 NOTE — BH Specialist Note (Signed)
Integrated Behavioral Health Initial In-Person Visit  MRN: [id number] Name: Alexandria Wallace  Number of [facility] Clinician visits: 1- Initial Visit  Session Start time: [phone]    Session End time: 7412  Total time in minutes: 55   Types of Service: Individual psychotherapy  Interpretor:No. Interpretor Name and Language: NA    Warm Hand Off Completed.        Subjective: Alexandria Wallace is a 81 y.o. female accompanied by  SELF Patient was referred by Dr. Wells Guiles Tat  for Role Transition new diagnoses of Parkinson's and adjustment to this DX and feelings associated  . Patient reports the following Feelings of loss of self esteem, sense of identity, loss of control, feelings of fear symptoms/concerns:Feelings of loss of self esteem, sense of identity, loss of control, feelings of fear  Duration of problem: Several months; Severity of problem: mild  Objective: Mood: Anxious and Sadness  and Affect: Appropriate Risk of harm to self or others: No plan to harm self or others  Life Context: Family and Social: Pt resides by herself in a two story two home, and has a daughter that lives in town and son that lives out of state.  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  Life Changes: Pt received DX several months ago about Parkinson's   Patient and/or Family's Strengths/Protective Factors: Concrete supports in place (healthy food, safe environments, etc.)  Goals Addressed: Patient will: Reduce symptoms of: depression, stress, and grief on the life role transition  Increase knowledge and/or ability of: coping skills  Demonstrate ability to: Increase healthy adjustment to current life circumstances, Increase adequate support systems for patient/family, and Begin healthy grieving over loss  Progress towards Goals: Ongoing  Interventions: Interventions utilized: Motivational Interviewing, Economist, CBT Cognitive Behavioral Therapy, and Link to [employer]  Standardized Assessments completed: Not Needed  Patient and/or Family Response: Pt open to doing therapeutic interventions of exploring her feelings of this role transition, doing journal activity , behavorial activation, increase social support, and engaging in talk therapy.    Patient Centered Plan: Patient is on the following Treatment Plan(s):  [place] social support , including exercise and support group meetings, engage in meaningful joyful activities .  Allow for her to vent her emotions in this change and validating her feelings.  Allow for patient to identify new opportunities that she can pursue,and that she still has control over .  Remind and encourage dialogue about her strengths that patient has and and complete self esteem worksheet that was provided .     Assessment: Patient currently experiencing Difficulty with feelings with life transition after medical diagnosis . Pt Reports feelings of low self Esteem and feelings of fear and lose of Identify and overall sadness .      Patient may benefit from axamine social support , including exercise and support group meetings, engage in meaningful joyful activities .  Allow for her to vent her emotions in this change and validating her feelings.  Allow for patient to identify new opportunities that she can pursue,and that she still has control over .  Remind and encourage dialogue about her strengths that patient has and and complete self esteem worksheet that was provided  .  Plan: Follow up with behavioral health clinician on : As needed  Behavioral recommendations: As stated above Increase social support, behavioral activation , journal activity on strengths and on venting emotions .  Referral(s): Community Resources:  Support and exercise options to build community and increase support  "From scale of 1-10, how likely are you to follow plan?": 10  Langley Ingalls A  Taylor-Paladino, LCSW

## 2022-08-05 NOTE — Telephone Encounter (Signed)
Patient calling back to follow up.

## 2022-08-06 NOTE — Telephone Encounter (Signed)
Hey tried to call this lady, but she didn't answer so I sent a mychart message, just sending this as an FYI, if she gets back with me I can get her the sample if you aren't here!

## 2022-08-06 NOTE — Telephone Encounter (Signed)
I spoke with our PharmD. She says that while the 420 mg repatha dose isn't common, it is reasonable. I believe we had a sample of the 420 mg pen at the office. If she tolerates this, we can send in the repatha to her pharmacy. Thanks.

## 2022-08-07 MED ORDER — REPATHA PUSHTRONEX SYSTEM 420 MG/3.5ML ~~LOC~~ SOCT: 420.0000 mg | SUBCUTANEOUS | 5 refills | Status: DC

## 2022-08-07 NOTE — Addendum Note (Signed)
Addended by: Gerald Stabs on: 08/07/2022 09:39 AM   Modules accepted: Orders

## 2022-08-11 ENCOUNTER — Ambulatory Visit
Admission: RE | Admit: 2022-08-11 | Discharge: 2022-08-11 | Disposition: A | Discharge status: Home / Self Care | Payer: Medicare PPO | Source: Ambulatory Visit | Attending: Family Medicine | Admitting: Family Medicine

## 2022-08-11 DIAGNOSIS — Z780 Asymptomatic menopausal state: Secondary | ICD-10-CM | POA: Diagnosis not present

## 2022-08-11 DIAGNOSIS — M8589 Other specified disorders of bone density and structure, multiple sites: Secondary | ICD-10-CM | POA: Diagnosis not present

## 2022-08-11 DIAGNOSIS — M810 Age-related osteoporosis without current pathological fracture: Secondary | ICD-10-CM

## 2022-08-12 ENCOUNTER — Other Ambulatory Visit: Payer: Self-pay | Admitting: Neurology

## 2022-08-12 ENCOUNTER — Other Ambulatory Visit: Payer: Self-pay | Admitting: Cardiology

## 2022-08-12 ENCOUNTER — Other Ambulatory Visit (HOSPITAL_BASED_OUTPATIENT_CLINIC_OR_DEPARTMENT_OTHER): Payer: Self-pay

## 2022-08-12 DIAGNOSIS — G20 Parkinson's disease: Secondary | ICD-10-CM

## 2022-08-12 MED ORDER — REPATHA PUSHTRONEX SYSTEM 420 MG/3.5ML ~~LOC~~ SOCT
420.0000 mg | SUBCUTANEOUS | 5 refills | Status: DC
  Filled 2022-08-12 – 2022-08-31 (×3): qty 3.5, 30d supply, fill #0
  Filled 2022-09-30: qty 3.5, 28d supply, fill #0
  Filled 2022-11-16: qty 3.5, 28d supply, fill #1
  Filled 2022-12-18: qty 3.5, 28d supply, fill #2
  Filled 2023-01-19: qty 3.5, 28d supply, fill #3
  Filled 2023-02-14: qty 3.5, 28d supply, fill #4
  Filled 2023-03-12: qty 3.5, 28d supply, fill #5

## 2022-08-12 MED ORDER — FLUOXETINE HCL 20 MG PO CAPS
20.0000 mg | ORAL_CAPSULE | Freq: Every day | ORAL | 1 refills | Status: DC
  Filled 2022-08-12 – 2022-09-30 (×2): qty 90, 90d supply, fill #0

## 2022-08-12 MED ORDER — IBANDRONATE SODIUM 150 MG PO TABS
150.0000 mg | ORAL_TABLET | ORAL | 3 refills | Status: DC
  Filled 2022-08-12 – 2022-09-30 (×2): qty 3, 90d supply, fill #0
  Filled 2023-02-14: qty 3, 90d supply, fill #1

## 2022-08-12 MED ORDER — BUSPIRONE HCL 10 MG PO TABS
10.0000 mg | ORAL_TABLET | Freq: Two times a day (BID) | ORAL | 2 refills | Status: DC
  Filled 2022-08-12: qty 60, 30d supply, fill #0

## 2022-08-12 NOTE — Telephone Encounter (Signed)
Rx request sent to pharmacy.  

## 2022-08-13 ENCOUNTER — Other Ambulatory Visit: Payer: Self-pay

## 2022-08-13 ENCOUNTER — Other Ambulatory Visit (HOSPITAL_BASED_OUTPATIENT_CLINIC_OR_DEPARTMENT_OTHER): Payer: Self-pay

## 2022-08-13 MED ORDER — CARBIDOPA-LEVODOPA 25-100 MG PO TABS
1.0000 | ORAL_TABLET | Freq: Three times a day (TID) | ORAL | 0 refills | Status: DC
  Filled 2022-08-13: qty 270, 90d supply, fill #0

## 2022-08-14 ENCOUNTER — Telehealth (HOSPITAL_BASED_OUTPATIENT_CLINIC_OR_DEPARTMENT_OTHER): Payer: Self-pay

## 2022-08-14 ENCOUNTER — Encounter (HOSPITAL_BASED_OUTPATIENT_CLINIC_OR_DEPARTMENT_OTHER): Payer: Self-pay

## 2022-08-14 NOTE — Telephone Encounter (Signed)
Received confirmation approval for Repatha until 11/29/22 via phone from pt insurance. Pt made aware via mychart

## 2022-08-17 ENCOUNTER — Other Ambulatory Visit (HOSPITAL_BASED_OUTPATIENT_CLINIC_OR_DEPARTMENT_OTHER): Payer: Self-pay

## 2022-08-17 NOTE — Progress Notes (Unsigned)
Assessment/Plan:   1.  Parkinsons Disease, diagnosed March, 2023  -Continue carbidopa/levodopa 25/100, 1 tablet 3 times per day.  Looks much better on medication.  -Congratulated her on all of the exercise she is doing.   -We discussed that it used to be thought that levodopa would increase risk of melanoma but now it is believed that Parkinsons itself likely increases risk of melanoma. she is to get regular skin checks.  -Check TSH.  Told patient/daughter if this was normal, then they need to follow-up with primary care.  Discussed with them that cold intolerance is not a part of mild Parkinson's.   Subjective:   Alexandria Wallace was seen today in follow up for Parkinsons disease, diagnosed last visit.  My previous records were reviewed prior to todays visit as well as outside records available to me. Daughter with patient and supplements hx.   She reports that she is really doing well.  "The medications are a miracle; they are a miracle."   Pt denies falls.  She is doing Tai Chi on Mondays at [employer], she is [employer] classes at [employer] and [employer] at [employer].  She is always walking.  Patient was having some low blood pressures.  She followed up with cardiology on August 17, but unfortunately the notes are not yet completed.  No hallucinations.  Mood has been good.  MRI brain was done since our last visit, primarily because of hyperreflexia.  She had no neck pain, so we opted not to do MRI cervical spine.  MRI brain was unremarkable for acute changes.  It was personally reviewed.  There was very minimal small vessel disease.  Patient did participate in speech, physical, occupational therapy since our last visit.  They ask about cold interence and would like to have thyroid checked.  pt more irritable and frustrated.  [facility].  On Prozac.  She is happy with those medicines.  Current prescribed movement disorder medications: Carbidopa/levodopa 25/100, 1 tablet 3 times per  day (started last visit)    ALLERGIES:   Allergies  Allergen Reactions   Statins Other (See Comments)    Myalgias   Chocolate     Stated by patient from blood work    CURRENT MEDICATIONS:  No outpatient medications have been marked as taking for the 08/18/22 encounter (Office Visit) with Reyes Fifield, Eustace Quail, DO.     Objective:   PHYSICAL EXAMINATION:    VITALS:   Vitals:   08/18/22 1532  BP: 131/72  Pulse: 93  SpO2: 96%  Weight: 154 lb (69.9 kg)  Height: 5' 4"  (1.626 m)    GEN:  The patient appears stated age and is in NAD. HEENT:  Normocephalic, atraumatic.  The mucous membranes are moist. The superficial temporal arteries are without ropiness or tenderness. CV:  RRR Lungs:  CTAB Neck/HEME:  There are no carotid bruits bilaterally.  Neurological examination:  Orientation: The patient is alert and oriented x3. Cranial nerves: There is good facial symmetry without facial hypomimia. The speech is fluent and clear. Soft palate rises symmetrically and there is no tongue deviation. Hearing is intact to conversational tone. Sensation: Sensation is intact to light touch throughout Motor: Strength is at least antigravity x4.  Movement examination: Tone: There is nl tone in the UE/LE (much better) Abnormal movements: none Coordination:  There is no decremation, with any form of RAMS, including alternating supination and pronation of the forearm, hand opening and closing, finger taps, heel taps and toe  taps. Gait and Station: The patient has no difficulty arising out of a deep-seated chair without the use of the hands. The patient's stride length is normal.  She is not dragging the L leg like she was   I have reviewed and interpreted the following labs independently    Chemistry      Component Value Date/Time   NA 140 06/08/2020 0842   NA 142 11/10/2018 0952   K 4.0 06/08/2020 0842   CL 109 06/08/2020 0842   CO2 24 06/08/2020 0842   BUN 9 06/08/2020 0842   BUN 14  11/10/2018 0952   CREATININE 0.88 06/08/2020 0842      Component Value Date/Time   CALCIUM 8.8 (L) 06/08/2020 0842   ALKPHOS 51 06/05/2020 1422   AST 46 (H) 06/05/2020 1422   ALT 38 06/05/2020 1422   BILITOT 0.5 06/05/2020 1422       Lab Results  Component Value Date   WBC 4.8 06/08/2020   HGB 10.4 (L) 06/08/2020   HCT 31.5 (L) 06/08/2020   MCV 101.3 (H) 06/08/2020   PLT 165 06/08/2020    No results found for: "TSH"   Total time spent on today's visit was 32 minutes, including both face-to-face time and nonface-to-face time.  Time included that spent on review of records (prior notes available to me/labs/imaging if pertinent), discussing treatment and goals, answering patient's questions and coordinating care.  Cc:  Cari Caraway, MD

## 2022-08-18 ENCOUNTER — Ambulatory Visit: Payer: Medicare PPO | Admitting: Neurology

## 2022-08-18 ENCOUNTER — Other Ambulatory Visit (INDEPENDENT_AMBULATORY_CARE_PROVIDER_SITE_OTHER): Payer: Medicare PPO

## 2022-08-18 ENCOUNTER — Encounter: Payer: Self-pay | Admitting: Neurology

## 2022-08-18 ENCOUNTER — Encounter (HOSPITAL_BASED_OUTPATIENT_CLINIC_OR_DEPARTMENT_OTHER): Payer: Self-pay | Admitting: Cardiology

## 2022-08-18 VITALS — BP 131/72 | HR 93 | Ht 64.0 in | Wt 154.0 lb

## 2022-08-18 DIAGNOSIS — R6889 Other general symptoms and signs: Secondary | ICD-10-CM

## 2022-08-18 DIAGNOSIS — G20 Parkinson's disease: Secondary | ICD-10-CM | POA: Diagnosis not present

## 2022-08-18 DIAGNOSIS — E7841 Elevated Lipoprotein(a): Secondary | ICD-10-CM | POA: Insufficient documentation

## 2022-08-18 DIAGNOSIS — E7849 Other hyperlipidemia: Secondary | ICD-10-CM | POA: Insufficient documentation

## 2022-08-18 NOTE — Patient Instructions (Addendum)
As we discussed, it used to be thought that levodopa would increase risk of melanoma but now it is believed that Parkinsons itself likely increases risk of melanoma. I recommend yearly skin checks with a board certified dermatologist.  You can call [facility] or [facility] for an appointment.  [facility] Address: [address], [place], [place] [address] Phone: [phone]  [facility] [place], [place], [place] Phone: [phone]  Your provider has requested that you have labwork completed today. The lab is located on the Second floor at [place], within the [facility] office. When you get off the elevator, turn right and go in the [facility] Endocrinology [address]; the first brown door on the left.  Tell the ladies behind the desk that you are there for lab work. If you are not called within 15 minutes please check with the front desk.   Once you complete your labs you are free to go. You will receive a call or message via MyChart with your lab results.    Local and Online Resources for Power over Parkinson's Group September 2023  LOCAL [place] PARKINSON'S GROUPS  Power over Parkinson's Group:   Power Over Parkinson's Patient Education Group will be Wednesday, September 13th-*Hybrid meting*- in person at [facility] location and via [facility] at 2 pm.   Upcoming Power over [employer] Meetings:  2nd Wednesdays of the month at 2 pm:  September 13th, October 11th, November 8th Contact Amy Marriott at [email or web address] if interested in participating in this group Parkinson's Care Partners Group:    3rd Mondays, Contact Misty Paladino Atypical Parkinsonian Patient Group:   4th Wednesdays, [employer] If you are interested in participating in these groups with Misty, please contact her directly for how to join those meetings.  Her contact information is  [email or web address].    LOCAL EVENTS AND NEW OFFERINGS New PWR! Moves [employer]negy Instructor-Led Classes offering at [employer]!  Wednesdays 1-2 pm.   Contact Vonna Kotyk at  [email or web address] or Caron Presume at [place], [email or web address] Dance for Parkinson 's classes will be on Tuesdays 9:30am-10:30am starting October 3-December 12 with a break the week of November 21 . Located in the December 04 which is in the first floor of the [employer]  ([place] for Parkinson's will be held on 2nd and 4th Mondays at 11:00am . First class will start  September 25th.  Located at the [place] ([place].) Through support from the [place] and Drumming for Parkinson's classes are free for both patients and caregivers.  Contact Misty Taylor-Paladino for more details about registering.  [place]:  www.[email or web address] PD Health at Home continues:  Mindfulness Mondays, Wellness Wednesdays, Fitness Fridays  Upcoming Education:   Navigating Nutrition with PD.  Wednesday, Sept. 6th 1:00-2:00 pm Understanding Mind and Memory.  Wednesday, Sept. 20th 1:00-2:00 pm  Expert Briefing:    Parkinson's Disease and the Bladder.  Wednesday, Sept. 13th 1:00-2:00 pm Parkinson's and the Gut-Brain Connection.  Wednesday, Oct. 11th 1:00-2:00 pm Register for expert briefings (webinars) at 05-16-1974 Please check out their website to sign up for emails and see their full online offerings   [place]:  [email or web address]  Third Thursday Webinars:  On the third Thursday of every month at 12 p.m. ET, join our free live webinars to learn about various aspects of  living with Parkinson's disease and our work to speed medical  breakthroughs. Upcoming Webinar:  Stay tuned Check out additional information on their website to see their full online offerings  [employer]:  [email or web address] Upcoming Webinar:   Stay tuned Webinar Series:  Living with Parkinson's Meetup.   Third Thursdays each month, 3 pm Care Partner Monthly Meetup.  With Robin Searing Phinney.  First Tuesday of each month, 2 pm Check out additional information to Live Well Today on their website  Parkinson and Movement Disorders (PMD) Alliance:  www.pmdalliance.org [employer] Online:  Online Education Events Sign up for emails, which are sent weekly to give you updates on programming and online offerings  Parkinson's Association of the [place]:  www.[email or web address] Information on online support groups, education events, and online exercises including Yoga, Parkinson's exercises and more-LOTS of information on links to PD resources and online events Virtual Support Group through Parkinson's Association of the [place]; next one is scheduled for Wednesday, October 4th at 2 pm. (No September meeting due to the symposium.  These are typically scheduled for the 1st Wednesday of the month at 2 pm).  Visit website for details. Register for "Caring for Parkinson's-Caring for You", 9th Annual Symposium.  In-person event in [place].  September 9th.  To register:  www.[email or web address].org/symposium-registration/?blm_aid=45150 MOVEMENT AND EXERCISE OPPORTUNITIES PWR! Moves Classes at [place].  Wednesdays 10 and 11 am.   Contact Amy Marriott, PT [email or web address] if interested. NEW PWR! Moves Class offerings at [employer].  Wednesdays 1-2 pm.  Contact Vonna Kotyk at  [email or web address] or Caron Presume at [place],  [email or web address] Parkinson's Wellness Recovery (PWR! Moves)  www.[email or web address] Info on the PWR! Virtual Experience:  You will have access to our  expertise through self-assessment, guided plans that start with the PD-specific fundamentals, educational content, tips, Q&A with an expert, and a growing [employer] of PD-specific pre-recorded and live exercise classes of varying types and intensity - both physical and cognitive! If that is not enough, we offer 1:1 wellness consultations (in-person or virtual) to personalize your PWR! Art therapist.  [place] Fridays:  As part of the PD Health @ Home program, this free video series focuses each week on one aspect of fitness designed to support people living with Parkinson's.  These weekly videos highlight the [place] recent fitness guidelines for people with Parkinson's disease. [address] Dance for PD website is offering free, live-stream classes throughout the week, as well as links to ModemGamers.si of classes:  [email or web address]/ Virtual dance and Pilates for Parkinson's classes: Click on the Community Tab> Parkinson's Movement Initiative Tab.  To register for classes and for more information, visit www.[employer] and click the "community" tab.  [employer] Parkinson's Cycling Classes  [employer]:  Thursdays @ Noon-Live classes at 01-14-1984 ([employer] at [email or web address] or [phone]) [employer]: Virtual Classes Mondays and Thursdays 01-14-1984 classes Tuesday, Wednesday and Thursday (contact [place] at [email or web address]  or [phone]) [place] Varied levels of classes are offered Tuesdays and Thursdays at 01-14-1984.  Stretching with [employer] weekly class is also offered for people with Parkinson's To observe a class or for more information, call [phone] or email [phone] at [email or web address] ADDITIONAL SUPPORT AND RESOURCES Well[employer]Spring [employer]:Online Caregiver Education  Opportunities:  www.well-springsolutions.org/caregiver-education/caregiver-support-group.  You may also contact Hezzie Bump at [email or web address] or ([phone].    Coping with Difficult Caregiver Emotions.  Wednesday, September 20th, 10:30 am-12.  The [facility] Navigating the Maze of Senior Care Options.  Thursday, September 28th, 4-5:15 pm.  The [facility]. Well-Spring Navigator:  [employer] program, a free service to help individuals and families through the journey of determining care for older adults.  The "Navigator" is a Education officer, museum, Arnell Asal, who will speak with a prospective client and/or loved ones to provide an assessment of the situation and a set of recommendations for a personalized care plan -- all free of charge, and whether Well[employer]Spring [employer] offers the needed service or not. If the need is not a service we provide, we are well-connected with reputable programs in town that we can refer you to.  www.well-springsolutions.org or to speak with the Navigator, call [phone].

## 2022-08-19 LAB — TSH: TSH: 1.1 u[IU]/mL (ref 0.35–5.50)

## 2022-08-25 ENCOUNTER — Ambulatory Visit (HOSPITAL_BASED_OUTPATIENT_CLINIC_OR_DEPARTMENT_OTHER): Payer: Medicare PPO | Admitting: Internal Medicine

## 2022-08-31 ENCOUNTER — Other Ambulatory Visit (HOSPITAL_BASED_OUTPATIENT_CLINIC_OR_DEPARTMENT_OTHER): Payer: Self-pay

## 2022-09-02 ENCOUNTER — Other Ambulatory Visit (HOSPITAL_BASED_OUTPATIENT_CLINIC_OR_DEPARTMENT_OTHER): Payer: Self-pay

## 2022-09-08 ENCOUNTER — Other Ambulatory Visit (HOSPITAL_BASED_OUTPATIENT_CLINIC_OR_DEPARTMENT_OTHER): Payer: Self-pay

## 2022-09-30 ENCOUNTER — Other Ambulatory Visit (HOSPITAL_BASED_OUTPATIENT_CLINIC_OR_DEPARTMENT_OTHER): Payer: Self-pay

## 2022-10-05 ENCOUNTER — Other Ambulatory Visit (HOSPITAL_BASED_OUTPATIENT_CLINIC_OR_DEPARTMENT_OTHER): Payer: Self-pay

## 2022-10-14 ENCOUNTER — Telehealth: Payer: Self-pay | Admitting: Cardiology

## 2022-10-14 NOTE — Telephone Encounter (Signed)
Spoke with pharmacist [employer] [facility], verbal ok for new Rx of Repatha to be sent, [phone] ref [phone] They will be in touch with patient Advised patient, verbalized understanding

## 2022-10-14 NOTE — Telephone Encounter (Signed)
Pt c/o medication issue:  1. Name of Medication: Evolocumab with Infusor ([place]) 420 MG/3.5ML SOCT   2. How are you currently taking this medication (dosage and times per day)?   3. Are you having a reaction (difficulty breathing--STAT)?   4. What is your medication issue? Pt called stating that [employer] is supposed to be sending her a replacement of this because it wasn't working properly and they have contacted Dr. Harrell Gave about this and need it expedited.

## 2022-10-29 ENCOUNTER — Ambulatory Visit: Payer: Medicare PPO | Admitting: Occupational Therapy

## 2022-10-29 ENCOUNTER — Ambulatory Visit: Payer: Medicare PPO

## 2022-10-29 ENCOUNTER — Ambulatory Visit: Payer: Medicare PPO | Admitting: Physical Therapy

## 2022-11-04 ENCOUNTER — Other Ambulatory Visit: Payer: Self-pay | Admitting: Neurology

## 2022-11-04 DIAGNOSIS — G20A1 Parkinson's disease without dyskinesia, without mention of fluctuations: Secondary | ICD-10-CM

## 2022-11-05 ENCOUNTER — Other Ambulatory Visit (HOSPITAL_BASED_OUTPATIENT_CLINIC_OR_DEPARTMENT_OTHER): Payer: Self-pay

## 2022-11-05 MED ORDER — CARBIDOPA-LEVODOPA 25-100 MG PO TABS
1.0000 | ORAL_TABLET | Freq: Three times a day (TID) | ORAL | 0 refills | Status: DC
  Filled 2022-11-05 – 2022-11-16 (×2): qty 270, 90d supply, fill #0

## 2022-11-12 ENCOUNTER — Other Ambulatory Visit (HOSPITAL_BASED_OUTPATIENT_CLINIC_OR_DEPARTMENT_OTHER): Payer: Self-pay

## 2022-11-16 ENCOUNTER — Other Ambulatory Visit (HOSPITAL_BASED_OUTPATIENT_CLINIC_OR_DEPARTMENT_OTHER): Payer: Self-pay

## 2022-11-17 ENCOUNTER — Other Ambulatory Visit (HOSPITAL_BASED_OUTPATIENT_CLINIC_OR_DEPARTMENT_OTHER): Payer: Self-pay

## 2022-11-17 DIAGNOSIS — H579 Unspecified disorder of eye and adnexa: Secondary | ICD-10-CM | POA: Diagnosis not present

## 2022-11-17 MED ORDER — OLOPATADINE HCL 0.2 % OP SOLN
1.0000 [drp] | Freq: Every day | OPHTHALMIC | 0 refills | Status: AC
  Filled 2022-11-17: qty 2.5, 10d supply, fill #0

## 2022-11-18 DIAGNOSIS — H04123 Dry eye syndrome of bilateral lacrimal glands: Secondary | ICD-10-CM | POA: Diagnosis not present

## 2022-11-24 ENCOUNTER — Other Ambulatory Visit (HOSPITAL_BASED_OUTPATIENT_CLINIC_OR_DEPARTMENT_OTHER): Payer: Self-pay

## 2022-12-22 ENCOUNTER — Other Ambulatory Visit (HOSPITAL_BASED_OUTPATIENT_CLINIC_OR_DEPARTMENT_OTHER): Payer: Self-pay

## 2022-12-24 DIAGNOSIS — F4322 Adjustment disorder with anxiety: Secondary | ICD-10-CM | POA: Diagnosis not present

## 2022-12-29 DIAGNOSIS — F310 Bipolar disorder, current episode hypomanic: Secondary | ICD-10-CM | POA: Diagnosis not present

## 2022-12-31 DIAGNOSIS — F4323 Adjustment disorder with mixed anxiety and depressed mood: Secondary | ICD-10-CM | POA: Diagnosis not present

## 2023-01-06 DIAGNOSIS — F4323 Adjustment disorder with mixed anxiety and depressed mood: Secondary | ICD-10-CM | POA: Diagnosis not present

## 2023-01-13 DIAGNOSIS — L821 Other seborrheic keratosis: Secondary | ICD-10-CM | POA: Diagnosis not present

## 2023-01-13 DIAGNOSIS — L814 Other melanin hyperpigmentation: Secondary | ICD-10-CM | POA: Diagnosis not present

## 2023-01-13 DIAGNOSIS — L578 Other skin changes due to chronic exposure to nonionizing radiation: Secondary | ICD-10-CM | POA: Diagnosis not present

## 2023-01-13 DIAGNOSIS — D229 Melanocytic nevi, unspecified: Secondary | ICD-10-CM | POA: Diagnosis not present

## 2023-01-13 DIAGNOSIS — D485 Neoplasm of uncertain behavior of skin: Secondary | ICD-10-CM | POA: Diagnosis not present

## 2023-01-13 DIAGNOSIS — D1801 Hemangioma of skin and subcutaneous tissue: Secondary | ICD-10-CM | POA: Diagnosis not present

## 2023-01-14 DIAGNOSIS — F4322 Adjustment disorder with anxiety: Secondary | ICD-10-CM | POA: Diagnosis not present

## 2023-01-26 ENCOUNTER — Other Ambulatory Visit: Payer: Self-pay | Admitting: Neurology

## 2023-01-26 DIAGNOSIS — G20A1 Parkinson's disease without dyskinesia, without mention of fluctuations: Secondary | ICD-10-CM

## 2023-01-28 ENCOUNTER — Other Ambulatory Visit (HOSPITAL_BASED_OUTPATIENT_CLINIC_OR_DEPARTMENT_OTHER): Payer: Self-pay

## 2023-01-28 DIAGNOSIS — F4322 Adjustment disorder with anxiety: Secondary | ICD-10-CM | POA: Diagnosis not present

## 2023-01-28 MED ORDER — CARBIDOPA-LEVODOPA 25-100 MG PO TABS
1.0000 | ORAL_TABLET | Freq: Three times a day (TID) | ORAL | 0 refills | Status: DC
  Filled 2023-01-28 – 2023-01-30 (×2): qty 270, 90d supply, fill #0

## 2023-01-29 ENCOUNTER — Other Ambulatory Visit (HOSPITAL_BASED_OUTPATIENT_CLINIC_OR_DEPARTMENT_OTHER): Payer: Self-pay

## 2023-01-30 ENCOUNTER — Other Ambulatory Visit (HOSPITAL_BASED_OUTPATIENT_CLINIC_OR_DEPARTMENT_OTHER): Payer: Self-pay

## 2023-02-15 ENCOUNTER — Other Ambulatory Visit (HOSPITAL_BASED_OUTPATIENT_CLINIC_OR_DEPARTMENT_OTHER): Payer: Self-pay

## 2023-02-16 DIAGNOSIS — F4323 Adjustment disorder with mixed anxiety and depressed mood: Secondary | ICD-10-CM | POA: Diagnosis not present

## 2023-02-17 DIAGNOSIS — R7303 Prediabetes: Secondary | ICD-10-CM | POA: Diagnosis not present

## 2023-02-17 DIAGNOSIS — D2272 Melanocytic nevi of left lower limb, including hip: Secondary | ICD-10-CM | POA: Diagnosis not present

## 2023-02-17 DIAGNOSIS — M810 Age-related osteoporosis without current pathological fracture: Secondary | ICD-10-CM | POA: Diagnosis not present

## 2023-02-17 DIAGNOSIS — R7309 Other abnormal glucose: Secondary | ICD-10-CM | POA: Diagnosis not present

## 2023-02-17 DIAGNOSIS — D649 Anemia, unspecified: Secondary | ICD-10-CM | POA: Diagnosis not present

## 2023-02-17 DIAGNOSIS — E7849 Other hyperlipidemia: Secondary | ICD-10-CM | POA: Diagnosis not present

## 2023-02-17 NOTE — Progress Notes (Unsigned)
Assessment/Plan:   1.  Parkinsons Disease, diagnosed March, 2023  -increase carbidopa/levodopa 25/100, 1.5 tablet 3 times per day.   -We discussed that it used to be thought that levodopa would increase risk of melanoma but now it is believed that Parkinsons itself likely increases risk of melanoma. she is to get regular skin checks.  2.  Low BP  -need to watch this closely  -asymptommatic  -increase hydration  3.  ?burning tongue syndrome  -she uses biotene  -discussed gabapentin 100 mg q hs x 1 week then bid  4.  GAD  -On Prozac and BuSpar  -Primary care managing  -Not optimally controlled.  -May need to consider psychiatry and/or counseling.  Subjective:   Alexandria Wallace was seen today in follow up for Parkinsons disease, diagnosed last visit.  My previous records were reviewed prior to todays visit as well as outside records available to me. Daughter with patient and supplements hx.   patient doing well from Parkinson's standpoint.  No falls but her balance is getting a bit worse.  She is noting a bit more flexed posture.  No lightheadedness or near syncope.  BP running in the 100's to 120's but doesn't feel lightheaded.  She "thinks" she gets about 6 glasses of water/day.    She does c/o tongue pain since 2016.  She hasn't talked with anyone about it.  States that she went off of zyprexa quickly at the time.  It is intermittent.  Its around the edge and on the tip.  It burns.  It may happen for days and then be gone for days to weeks.  No tongue movement on own.  She is anxious.  Current prescribed movement disorder medications: Carbidopa/levodopa 25/100, 1 tablet 3 times per day     ALLERGIES:   Allergies  Allergen Reactions   Statins Other (See Comments)    Myalgias   Chocolate     Stated by patient from blood work    CURRENT MEDICATIONS:  Current Meds  Medication Sig   acetaminophen (TYLENOL) 500 MG tablet Take 500 mg by mouth every 6 (six) hours as needed  (for pain.).   Ascorbic Acid (VITAMIN C) 1000 MG tablet Take 1,000 mg by mouth every evening.   busPIRone (BUSPAR) 10 MG tablet Take 1 tablet (10 mg total) by mouth 2 (two) times daily.   CALCIUM LACTATE PO Take 520 mg by mouth 2 (two) times daily.   carbidopa-levodopa (SINEMET IR) 25-100 MG tablet Take 1 tablet by mouth 3 (three) times daily.   Cholecalciferol (VITAMIN D3) 75 MCG (3000 UT) TABS Take 3,000 Units by mouth daily.   Coenzyme Q10 (CO Q10) 100 MG CAPS Take 100 mg by mouth every evening.    COLLAGEN PO Take 3,000 mg by mouth daily at 12 noon.   Evolocumab with Infusor (Baxter) 420 MG/3.5ML SOCT Inject 420 mg into the skin every 30 (thirty) days.   FLUoxetine (PROZAC) 10 MG capsule Take 10 mg by mouth daily.   FLUoxetine (PROZAC) 20 MG capsule Take 1 capsule (20 mg total) by mouth daily.   ibandronate (BONIVA) 150 MG tablet 1 tablet   ibandronate (BONIVA) 150 MG tablet Take 1 tablet (150 mg total) by mouth once every month, 60 minutes before the first food,beverage,or medicine of the day with plain water   nitroGLYCERIN (NITROSTAT) 0.4 MG SL tablet 1 tab at onset of chest pain, may repeat once after 5 minutes   Omega-3 Fatty Acids (FISH  OIL) 1000 MG CAPS Take 1,000 mg by mouth at bedtime.   PSYLLIUM PO Take 1 tablet by mouth daily at 12 noon.   TURMERIC CURCUMIN PO Take 250 mg by mouth daily at 12 noon.   TURMERIC PO Take 448 mg by mouth every evening.     Objective:   PHYSICAL EXAMINATION:    VITALS:   Vitals:   02/18/23 1515  BP: (!) 93/54  Pulse: 77  SpO2: 97%  Weight: 155 lb (70.3 kg)  Height: 5\' 4"  (1.626 m)     GEN:  The patient appears stated age and is in NAD. HEENT:  Normocephalic, atraumatic.  The mucous membranes are moist. The superficial temporal arteries are without ropiness or tenderness.   Neurological examination:  Orientation: The patient is alert and oriented x3. Cranial nerves: There is good facial symmetry without facial  hypomimia. The speech is fluent and clear. Soft palate rises symmetrically and there is no tongue deviation. Hearing is intact to conversational tone. Sensation: Sensation is intact to light touch throughout Motor: Strength is at least antigravity x4.  Movement examination: Tone: There is mild increased tone in the RUE>LUE Abnormal movements: min tremor in the L arm Coordination:  There is no decremation, with any form of RAMS, including alternating supination and pronation of the forearm, hand opening and closing, finger taps, heel taps and toe taps. Gait and Station: The patient has no difficulty arising out of a deep-seated chair without the use of the hands. The patient's stride length is good but she has decreased arm swing on the R  I have reviewed and interpreted the following labs independently    Chemistry      Component Value Date/Time   NA 140 06/08/2020 0842   NA 142 11/10/2018 0952   K 4.0 06/08/2020 0842   CL 109 06/08/2020 0842   CO2 24 06/08/2020 0842   BUN 9 06/08/2020 0842   BUN 14 11/10/2018 0952   CREATININE 0.88 06/08/2020 0842      Component Value Date/Time   CALCIUM 8.8 (L) 06/08/2020 0842   ALKPHOS 51 06/05/2020 1422   AST 46 (H) 06/05/2020 1422   ALT 38 06/05/2020 1422   BILITOT 0.5 06/05/2020 1422       Lab Results  Component Value Date   WBC 4.8 06/08/2020   HGB 10.4 (L) 06/08/2020   HCT 31.5 (L) 06/08/2020   MCV 101.3 (H) 06/08/2020   PLT 165 06/08/2020    Lab Results  Component Value Date   TSH 1.10 08/18/2022     Total time spent on today's visit was 36 minutes, including both face-to-face time and nonface-to-face time.  Time included that spent on review of records (prior notes available to me/labs/imaging if pertinent), discussing treatment and goals, answering patient's questions and coordinating care.  Cc:  Cari Caraway, MD

## 2023-02-18 ENCOUNTER — Other Ambulatory Visit (HOSPITAL_BASED_OUTPATIENT_CLINIC_OR_DEPARTMENT_OTHER): Payer: Self-pay

## 2023-02-18 ENCOUNTER — Encounter: Payer: Self-pay | Admitting: Neurology

## 2023-02-18 ENCOUNTER — Other Ambulatory Visit: Payer: Self-pay

## 2023-02-18 ENCOUNTER — Ambulatory Visit: Payer: Medicare PPO | Admitting: Neurology

## 2023-02-18 VITALS — BP 93/54 | HR 77 | Ht 64.0 in | Wt 155.0 lb

## 2023-02-18 DIAGNOSIS — G20A1 Parkinson's disease without dyskinesia, without mention of fluctuations: Secondary | ICD-10-CM

## 2023-02-18 DIAGNOSIS — F411 Generalized anxiety disorder: Secondary | ICD-10-CM

## 2023-02-18 DIAGNOSIS — I959 Hypotension, unspecified: Secondary | ICD-10-CM

## 2023-02-18 DIAGNOSIS — K146 Glossodynia: Secondary | ICD-10-CM

## 2023-02-18 MED ORDER — CARBIDOPA-LEVODOPA 25-100 MG PO TABS: 1.5000 | ORAL_TABLET | Freq: Three times a day (TID) | ORAL | 1 refills | Status: DC

## 2023-02-18 MED ORDER — GABAPENTIN 100 MG PO CAPS
100.0000 mg | ORAL_CAPSULE | Freq: Two times a day (BID) | ORAL | 1 refills | Status: DC
  Filled 2023-02-18: qty 180, 90d supply, fill #0

## 2023-02-18 MED ORDER — FLUOXETINE HCL 20 MG PO CAPS
20.0000 mg | ORAL_CAPSULE | Freq: Every day | ORAL | 2 refills | Status: DC
  Filled 2023-02-18: qty 90, 90d supply, fill #0

## 2023-02-18 NOTE — Patient Instructions (Signed)
Increase carbidopa/levodopa 25/100, 1.5 tablets three times per day  Start gabapentin 100 mg at bed x 1 week and then increase to 100 mg twice per day  Increase water intake.  Aim for at least 60 oz per day.  Local and Online Resources for Power over Parkinson's Group  March 2024   LOCAL [place] PARKINSON'S GROUPS   Power over Parkinson's Group:    Power Over Parkinson's Patient Education Group will be Wednesday, March 13th-*Hybrid meting*- in person at [facility] location and via [facility], 2:00-3:00 pm.   Starting in November 2023, Power over [employer] and Care Partner Groups will meet together, with plans for separate break out session for caregivers (*this will be evolving over the next few months) Upcoming Power over Parkinson's Meetings/Care Partner Support:  2nd Wednesdays of the month at 2 pm:  March 13th, April 10th [place] at [email or web address] if interested in participating in this group    LOCAL EVENTS AND NEW OFFERINGS  Let's Try Pickleball-$25 for 6 weeks of Pickleball, starting February 2nd.  Contact Corwin Levins for more details.  [email or web address] NEW:  Parkinson's Social Game Night.  First Thursday of each month, 2:00-4:00 pm.  *Next date is MARCH 7th*.  [place], [employer].  Contact [email or web address] if interested. Parkinson's CarePartner Group for Men is in the works, if interested email [email or web address] ACT FITNESS Chair Yoga classes "Train and Gain", Fridays 10 am, [employer].  Contact Gina at [phone].  PWR! Moves [employer] Instructor-Led Classes offering at [employer]!  TUESDAYS and Wednesdays 1-2 pm.   Contact Vonna Kotyk at  [email or web address]  or [phone] (Tuesday classes are modified for chair and standing only) Drumming for Parkinson's will be held on 2nd and 4th Mondays at 11:00 am.   Located at the [place] ([place]  Contact Doylene Canning at [email or web address] or [phone]  Dance for Parkinson 's classes will be on Tuesdays 10-11 am starting in February. Located in the [employer] , in the first floor of the [employer] ([place].) To register:  [email or web address] or [id number] [facility] [place] Class, Mondays at 11 am.  Call [phone] for details Moving [place].  Saturday, May 4th, 10 am start.  Register at [employer].[place]:  [email or web address]  PD Health at Home continues:  Mindfulness Mondays, Wellness Wednesdays, Fitness Fridays  (PWR! Moves as part of Fitness Fridays March 22nd, 1-1:45 pm) Upcoming Education:   Managing "Off" Periods:  Return of Parkinson's Symptoms.  Wednesday, March 20th, 1-2 pm Parkinson's 101.  Wednesday, April 3rd, 1-2 pm Expert Briefing:  Understanding Pain in Parkinson's.   Wednesday, March 13th, 1-2 pm  Research Update:  Working to [employer] PD.  Wednesday, April 10th, 1-2 pm Register for virtual education and Patent attorney (webinars) at [email or web address] Please check out their website to sign up for emails and see their full online offerings     [place]:  [email or web address]   Third Thursday Webinars:  On the third Thursday of every month at 12 p.m. ET, join our free live webinars to learn about various aspects of living with Parkinson's disease and our work to speed medical breakthroughs.  Upcoming Webinar:  Everyday Exposure to Parkinson's:  Environmental Connections to the Disease.  Thursday, March 21st at 12 noon. Check out additional information on their website to see their full online  offerings    [place]:  [email or web address]  Upcoming Webinar:   Nutrition and Parkinson's.  Wednesday, March 6th, 12  noon Webinar Series:  Living with Parkinson's Meetup.   Third Thursdays each month, 3 pm  Care Partner Monthly Meetup.  With Robin Searing [place].  First Tuesday of each month, 2 pm  Check out additional information to Live Well Today on their website    Parkinson and Movement Disorders (PMD) Alliance:  www.[email or web address]  [employer] Online:  Online Education Events  Sign up for emails, which are sent weekly to give you updates on programming and online offerings    Parkinson's Association of the [place]:  www.[email or web address]  Information on online support groups, education events, and online exercises including Yoga, Parkinson's exercises and more-LOTS of information on links to PD resources and online events  Virtual Support Group through Parkinson's Association of the [place]; next one is scheduled for Wednesday, March 6th  MOVEMENT AND EXERCISE OPPORTUNITIES  PWR! Moves Classes at [place].  Wednesdays 10 and 11 am.   Contact Amy Marriott, PT [email or web address] if interested.  PWR! Moves Class offerings at [employer]. *TUESDAYS* and Wednesdays 1-2 pm.    Contact Vonna Kotyk at  [email or web address]    Parkinson's Wellness Recovery (PWR! Moves)  [email or web address].[email or web address]  Info on the PWR! Virtual Experience:  You will have access to our expertise?through self-assessment, guided plans that start with the PD-specific fundamentals, educational content, tips, Q&A with an expert, and a growing Art therapist of PD-specific pre-recorded and live exercise classes of varying types and intensity - both physical and cognitive! If that is not enough, we offer 1:1 wellness consultations (in-person or virtual) to personalize your PWR! Research scientist (medical).   [place] Fridays:   As part of the PD Health @ Home program, this free video series focuses each week on one aspect of fitness designed to support people living with Parkinson's.? These  weekly videos highlight the [place] fitness guidelines for people with Parkinson's disease.  ModemGamers.[email or web address]  Dance for PD website is offering free, live-stream classes throughout the week, as well as links to [employer] of classes:  [email or web address]  Virtual dance and Pilates for Parkinson's classes: Click on the Community Tab> Parkinson's Movement Initiative Tab.  To register for classes and for more information, visit [email or web address] and click the "community" tab.   [employer] Parkinson's Cycling Classes   [employer]:  Thursdays @ Noon-Live classes at [employer] ([employer] at [email or web address]?or [phone])  [employer]: Virtual Classes Mondays and Thursdays Jeanette Caprice classes Tuesday, Wednesday and Thursday (contact [place] at [email or web address] ?or [phone])  [place]  Varied levels of classes are offered Tuesdays and Thursdays at [employer].   Stretching with Verdis Frederickson weekly class is also offered for people with Parkinson's  To observe a class or for more information, call [phone] or email Hezzie Bump at [email or web address]   ADDITIONAL SUPPORT AND RESOURCES  [employer]:Online Caregiver Education Opportunities:  www.well-springsolutions.org/caregiver-education/caregiver-support-group.  You may also contact Vickki Muff at [email or web address] or [phone].     Family Caregiver [phone].  Thursday, March 7th, 10:15-1:45 at [facility].  Register with [facility] (see above) Well-Spring Navigator:  Just1Navigator program, a?free service to help individuals and families through the journey of determining care for older adults.  The "Navigator" is a Vickki Muff, Education officer, museum, who will speak with a prospective client and/or loved ones to provide an assessment of the situation and a set of  recommendations for a personalized  care plan -- all free of charge, and whether?Well[employer]Spring [employer] offers the needed service or not. If the need is not a service we provide, we are well-connected with reputable programs in town that we can refer you to.  www.well-springsolutions.org or to speak with the Navigator, call [phone].

## 2023-02-25 ENCOUNTER — Other Ambulatory Visit: Payer: Self-pay | Admitting: Family Medicine

## 2023-02-25 ENCOUNTER — Other Ambulatory Visit (HOSPITAL_BASED_OUTPATIENT_CLINIC_OR_DEPARTMENT_OTHER): Payer: Self-pay

## 2023-02-25 DIAGNOSIS — Z1231 Encounter for screening mammogram for malignant neoplasm of breast: Secondary | ICD-10-CM | POA: Diagnosis not present

## 2023-02-25 DIAGNOSIS — N1831 Chronic kidney disease, stage 3a: Secondary | ICD-10-CM | POA: Diagnosis not present

## 2023-02-25 DIAGNOSIS — R7303 Prediabetes: Secondary | ICD-10-CM | POA: Diagnosis not present

## 2023-02-25 DIAGNOSIS — E7849 Other hyperlipidemia: Secondary | ICD-10-CM | POA: Diagnosis not present

## 2023-02-25 DIAGNOSIS — Z0000 Encounter for general adult medical examination without abnormal findings: Secondary | ICD-10-CM | POA: Diagnosis not present

## 2023-02-25 DIAGNOSIS — I25119 Atherosclerotic heart disease of native coronary artery with unspecified angina pectoris: Secondary | ICD-10-CM | POA: Diagnosis not present

## 2023-02-25 DIAGNOSIS — F39 Unspecified mood [affective] disorder: Secondary | ICD-10-CM | POA: Diagnosis not present

## 2023-02-25 DIAGNOSIS — G20A1 Parkinson's disease without dyskinesia, without mention of fluctuations: Secondary | ICD-10-CM | POA: Diagnosis not present

## 2023-02-25 DIAGNOSIS — M810 Age-related osteoporosis without current pathological fracture: Secondary | ICD-10-CM | POA: Diagnosis not present

## 2023-02-25 MED ORDER — IBANDRONATE SODIUM 150 MG PO TABS
ORAL_TABLET | ORAL | 3 refills | Status: DC
  Filled 2023-02-25: qty 3, 90d supply, fill #0
  Filled 2023-04-25: qty 3, 84d supply, fill #0
  Filled 2023-08-15: qty 3, 84d supply, fill #1
  Filled 2023-12-21: qty 3, 84d supply, fill #2

## 2023-03-01 ENCOUNTER — Telehealth: Payer: Self-pay | Admitting: Neurology

## 2023-03-01 NOTE — Telephone Encounter (Signed)
Patient would like to know if its okay to take her supplements with her carbo/levo.

## 2023-03-01 NOTE — Telephone Encounter (Signed)
Patient is taking  Mitochondrion recharge MCT oil CoQ10 Nova Carboxy binder BPC 157 Quill tox

## 2023-03-02 NOTE — Telephone Encounter (Signed)
Called patient and let her know Dr. Tats recommendations  

## 2023-03-12 ENCOUNTER — Other Ambulatory Visit (HOSPITAL_BASED_OUTPATIENT_CLINIC_OR_DEPARTMENT_OTHER): Payer: Self-pay

## 2023-03-15 ENCOUNTER — Other Ambulatory Visit: Payer: Self-pay

## 2023-03-16 ENCOUNTER — Other Ambulatory Visit (HOSPITAL_BASED_OUTPATIENT_CLINIC_OR_DEPARTMENT_OTHER): Payer: Self-pay

## 2023-03-18 ENCOUNTER — Other Ambulatory Visit (HOSPITAL_BASED_OUTPATIENT_CLINIC_OR_DEPARTMENT_OTHER): Payer: Self-pay

## 2023-03-18 DIAGNOSIS — F4323 Adjustment disorder with mixed anxiety and depressed mood: Secondary | ICD-10-CM | POA: Diagnosis not present

## 2023-03-18 MED ORDER — BUSPIRONE HCL 10 MG PO TABS
10.0000 mg | ORAL_TABLET | Freq: Two times a day (BID) | ORAL | 2 refills | Status: AC
  Filled 2023-03-18: qty 60, 30d supply, fill #0

## 2023-04-01 ENCOUNTER — Other Ambulatory Visit (HOSPITAL_BASED_OUTPATIENT_CLINIC_OR_DEPARTMENT_OTHER): Payer: Self-pay

## 2023-04-01 DIAGNOSIS — G20A1 Parkinson's disease without dyskinesia, without mention of fluctuations: Secondary | ICD-10-CM | POA: Diagnosis not present

## 2023-04-01 DIAGNOSIS — Z6824 Body mass index (BMI) 24.0-24.9, adult: Secondary | ICD-10-CM | POA: Diagnosis not present

## 2023-04-01 DIAGNOSIS — R1013 Epigastric pain: Secondary | ICD-10-CM | POA: Diagnosis not present

## 2023-04-01 DIAGNOSIS — R110 Nausea: Secondary | ICD-10-CM | POA: Diagnosis not present

## 2023-04-01 MED ORDER — PANTOPRAZOLE SODIUM 40 MG PO TBEC
40.0000 mg | DELAYED_RELEASE_TABLET | Freq: Every day | ORAL | 1 refills | Status: DC
  Filled 2023-04-01: qty 30, 30d supply, fill #0

## 2023-04-05 ENCOUNTER — Other Ambulatory Visit: Payer: Self-pay | Admitting: Neurology

## 2023-04-05 ENCOUNTER — Other Ambulatory Visit (HOSPITAL_BASED_OUTPATIENT_CLINIC_OR_DEPARTMENT_OTHER): Payer: Self-pay

## 2023-04-05 DIAGNOSIS — G20A1 Parkinson's disease without dyskinesia, without mention of fluctuations: Secondary | ICD-10-CM

## 2023-04-05 MED ORDER — CARBIDOPA-LEVODOPA 25-100 MG PO TABS
1.5000 | ORAL_TABLET | Freq: Three times a day (TID) | ORAL | 0 refills | Status: DC
Start: 2023-04-05 — End: 2023-07-16
  Filled 2023-04-05: qty 403, 90d supply, fill #0

## 2023-04-07 ENCOUNTER — Other Ambulatory Visit (HOSPITAL_BASED_OUTPATIENT_CLINIC_OR_DEPARTMENT_OTHER): Payer: Self-pay

## 2023-04-07 MED ORDER — AMOXICILLIN 500 MG PO CAPS
ORAL_CAPSULE | ORAL | 1 refills | Status: DC
  Filled 2023-04-07: qty 21, 7d supply, fill #0

## 2023-04-22 ENCOUNTER — Ambulatory Visit
Admission: RE | Admit: 2023-04-22 | Discharge: 2023-04-22 | Disposition: A | Discharge status: Home / Self Care | Payer: Medicare PPO | Source: Ambulatory Visit | Attending: Family Medicine | Admitting: Family Medicine

## 2023-04-22 DIAGNOSIS — Z1231 Encounter for screening mammogram for malignant neoplasm of breast: Secondary | ICD-10-CM | POA: Diagnosis not present

## 2023-04-25 ENCOUNTER — Other Ambulatory Visit (HOSPITAL_BASED_OUTPATIENT_CLINIC_OR_DEPARTMENT_OTHER): Payer: Self-pay

## 2023-04-25 ENCOUNTER — Other Ambulatory Visit: Payer: Self-pay | Admitting: Cardiology

## 2023-04-26 ENCOUNTER — Other Ambulatory Visit: Payer: Self-pay

## 2023-04-27 ENCOUNTER — Other Ambulatory Visit (HOSPITAL_BASED_OUTPATIENT_CLINIC_OR_DEPARTMENT_OTHER): Payer: Self-pay

## 2023-04-27 DIAGNOSIS — F4323 Adjustment disorder with mixed anxiety and depressed mood: Secondary | ICD-10-CM | POA: Diagnosis not present

## 2023-04-27 MED ORDER — REPATHA PUSHTRONEX SYSTEM 420 MG/3.5ML ~~LOC~~ SOCT
420.0000 mg | SUBCUTANEOUS | 0 refills | Status: DC
  Filled 2023-04-27: qty 3.5, 28d supply, fill #0

## 2023-04-28 NOTE — Telephone Encounter (Signed)
Left message for patient to call and schedule overdue follow up with Dr. Christopher for medication refills 

## 2023-04-30 NOTE — Telephone Encounter (Signed)
Patient is scheduled 05/05/23 with Dr. Cristal Deer

## 2023-05-05 ENCOUNTER — Encounter (HOSPITAL_BASED_OUTPATIENT_CLINIC_OR_DEPARTMENT_OTHER): Payer: Self-pay | Admitting: Cardiology

## 2023-05-05 ENCOUNTER — Ambulatory Visit (HOSPITAL_BASED_OUTPATIENT_CLINIC_OR_DEPARTMENT_OTHER): Payer: Medicare PPO | Admitting: Cardiology

## 2023-05-05 VITALS — BP 104/60 | HR 73 | Ht 64.0 in | Wt 153.9 lb

## 2023-05-05 DIAGNOSIS — E7841 Elevated Lipoprotein(a): Secondary | ICD-10-CM

## 2023-05-05 DIAGNOSIS — E7849 Other hyperlipidemia: Secondary | ICD-10-CM | POA: Diagnosis not present

## 2023-05-05 DIAGNOSIS — M791 Myalgia, unspecified site: Secondary | ICD-10-CM

## 2023-05-05 DIAGNOSIS — I2510 Atherosclerotic heart disease of native coronary artery without angina pectoris: Secondary | ICD-10-CM | POA: Diagnosis not present

## 2023-05-05 DIAGNOSIS — T466X5D Adverse effect of antihyperlipidemic and antiarteriosclerotic drugs, subsequent encounter: Secondary | ICD-10-CM | POA: Diagnosis not present

## 2023-05-05 NOTE — Progress Notes (Signed)
Cardiology Office Note:    Date:  05/05/2023   ID:  Alexandria Wallace, DOB October 02, 1941, MRN [id number]  PCP:  Alexandria Dimitri, MD  Cardiologist:  Jodelle Red, MD  Referring MD: Alexandria Dimitri, MD   CC: Follow up  History of Present Illness:    Alexandria Wallace is a 82 y.o. female with a hx of hyperlipidemia, Parkinson's, who is seen for follow-up. She was previously followed by Dr. Rennis Wallace, last seen by him on 09/30/2021 where she was having issues with her legs. 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  Cardiovascular risk factors: Comorbid conditions: Hyperlipidemia - She believes that the Repatha had been the most successful therapy so far (stopped this at least 6 months ago; was on 420 mg once per month). Previously was on Crestor for a long time, but she was concerned about side effects of memory loss. She decided not to go through with the LP(a) clinical trial.  Family history: Her mother died at age 78 of an MI but there is no other coronary disease in the family. Her mother also had hyperlipidemia. Prior cardiac testing and/or incidental findings on other testing (ie coronary calcium): Heart Cath 10/2018, CCTA 2019  At her last visit she also noted occasional "thumping" palpitations when lying down on her left side. Also noticed some shortness of breath with climbing stairs, but would recover quickly. She wasn't aware of any chest pain. She wished to retrial Repatha.  Today, she states she is feeling well most of the time, but not always. She continues to work with Dr. Arbutus Wallace. Her motor function has been stable lately. She tries to be active with something every day. Her most strenuous/difficult activity is carrying groceries up the 14 stairs to her condo.  She is still able to complete this, even if it takes multiple trips. Usually she doesn't have palpitations while carrying the groceries.  No loss of consciousness, although her blood pressure is low sometimes. Occasionally when standing up from a chair she feels lightheaded, then sits back down. She has measured her blood pressure as low as 90/50 at such times. She waits for a few minutes then continues.  She seems to be tolerating her meds, and states that she is happy with her current regimen. She has been able to obtain her Repatha pushtronex from the pharmacy downstairs.  Also presents copies of her recent lab work with Dr. Corliss Wallace which we reviewed in detail.  She denies any chest pain, shortness of breath, peripheral edema, headaches, syncope, orthopnea, or PND.   Past Medical History:  Diagnosis Date   Colon polyp    High cholesterol    Mood disorder [facility])     Past Surgical History:  Procedure Laterality Date   COLECTOMY     EUS N/A 11/29/2015   Procedure: LOWER ENDOSCOPIC ULTRASOUND (EUS);  Surgeon: Alexandria Modena, MD;  Location: [facility] ENDOSCOPY;  Service: Endoscopy;  Laterality: N/A;   FLEXIBLE SIGMOIDOSCOPY N/A 01/27/2018   Procedure: FLEXIBLE SIGMOIDOSCOPY;  Surgeon: Alexandria Der, MD;  Location: [facility] ENDOSCOPY;  Service: Gastroenterology;  Laterality: N/A;   LEFT HEART CATH AND CORONARY ANGIOGRAPHY N/A 11/15/2018   Procedure: LEFT HEART CATH AND CORONARY ANGIOGRAPHY;  Surgeon: Alexandria Bihari, MD;  Location: [facility] INVASIVE CV LAB;  Service: Cardiovascular;  Laterality: N/A;   POLYPECTOMY  02/19/2016    Current Medications: Current Outpatient Medications on File Prior to Visit  Medication Sig   acetaminophen (TYLENOL) 500 MG tablet Take 500 mg by mouth every 6 (six) hours as needed (for pain.).   Ascorbic Acid (VITAMIN C) 1000 MG tablet Take 1,000 mg by mouth every evening.   busPIRone (BUSPAR) 10 MG tablet Take 1 tablet (10 mg total) by mouth 2 (two) times daily.   CALCIUM  LACTATE PO Take 520 mg by mouth 2 (two) times daily.   carbidopa-levodopa (SINEMET IR) 25-100 MG tablet Take 1.5 tablets by mouth 3 (three) times daily.   Cholecalciferol (VITAMIN D3) 75 MCG (3000 UT) TABS Take 3,000 Units by mouth daily.   Coenzyme Q10 (CO Q10) 100 MG CAPS Take 100 mg by mouth every evening.    COLLAGEN PO Take 3,000 mg by mouth daily at 12 noon.   Evolocumab with Infusor (REPATHA PUSHTRONEX SYSTEM) 420 MG/3.5ML SOCT Inject 420 mg into the skin every 30 (thirty) days.   FLUoxetine (PROZAC) 20 MG capsule Take 1 capsule (20 mg total) by mouth daily.   gabapentin (NEURONTIN) 100 MG capsule Take 1 capsule (100 mg total) by mouth 2 (two) times daily.   ibandronate (BONIVA) 150 MG tablet Take 1 tablet by mouth once a month 60 minutes before the first food, beverage or medicine of the day with plain water   nitroGLYCERIN (NITROSTAT) 0.4 MG SL tablet 1 tab at onset of chest pain, may repeat once after 5 minutes   Omega-3 Fatty Acids (FISH OIL) 1000 MG CAPS Take 1,000 mg by mouth at bedtime.   pantoprazole (PROTONIX) 40 MG tablet Take 1 tablet (40 mg total) by mouth daily before a meal   PSYLLIUM PO Take 1 tablet by mouth daily at 12 noon.   TURMERIC CURCUMIN PO Take 250 mg by mouth daily at 12 noon.   TURMERIC PO Take 448 mg by mouth every evening.   No current facility-administered medications on file prior to visit.     Allergies:   Statins and Chocolate   Social History   Tobacco Use   Smoking status: Never   Smokeless tobacco: Never  Vaping Use   Vaping Use: Never used  Substance Use Topics   Alcohol use: Yes    Alcohol/week: 2.0 standard drinks of alcohol    Types: 2 Glasses of wine per week    Comment: occasionaly   Drug use: No    Family History: family history includes Breast cancer (age of onset: 37) in her daughter; Cancer in her father; Cancer (age of onset: 41) in her sister; Heart attack in her mother; Other (age of onset: 27) in her brother.  ROS:    Please see the history of present illness. (+) Lightheadedness All other systems are reviewed and negative.    EKGs/Labs/Other Studies Reviewed:    The following studies were reviewed today:  Echocardiogram  06/06/2020:  1. Left ventricular ejection fraction, by estimation, is 60 to 65%. The  left ventricle has normal function. The left ventricle has no regional  wall motion abnormalities. There is mild left ventricular hypertrophy.  Left ventricular diastolic parameters  are consistent with Grade I diastolic dysfunction (impaired relaxation).   2. Right ventricular systolic function is normal. The right ventricular  size is normal. There is mildly elevated pulmonary artery systolic  pressure. The estimated right ventricular systolic pressure is 40.0 mmHg.   3. The mitral valve is degenerative. No evidence of  mitral valve  regurgitation. No evidence of mitral stenosis.   4. The aortic valve is tricuspid. Aortic valve regurgitation is not  visualized. Mild aortic valve sclerosis is present, with no evidence of  aortic valve stenosis.   5. The inferior vena cava is dilated in size with <50% respiratory  variability, suggesting right atrial pressure of 15 mmHg.   PHYSIOLOGIC VASCULAR STUDY OF BLE  11/10/2019: FINDINGS: Right:   Resting ankle brachial index:  1.19   Post exercise ABI 0.90   Segmental blood pressure: Upper extremity pressures are symmetric. Appropriate increased the thigh. No significant drop between segments.   Doppler: Segmental Doppler demonstrates triphasic waveforms throughout   Pulse volume recording: Segmental PVR maintained throughout the right lower extremity   Left:   Resting ankle brachial index: 1.22   Post exercise ABI 0.88   Segmental blood pressure: Upper extremity pressures are symmetric. Appropriate increased to the thigh. No significant drop between segments.   Doppler: Segmental Doppler demonstrates triphasic waveforms throughout    Pulse volume recording: Segmental PVR maintained throughout the left lower extremity.   Additional:   IMPRESSION: Resting ABI of the bilateral lower extremity within normal limits, with post exercise ABI demonstrating slight drop to the mild range of arterial occlusive disease.   Segmental exam demonstrates no significant arterial occlusive disease bilateral lower extremities.  Left Heart Cath 11/15/2018: The left ventricular systolic function is normal. LV end diastolic pressure is normal. The left ventricular ejection fraction is 55-65% by visual estimate.   There is evidence for very mild coronary calcification without evidence for coronary obstructive disease.  The patient has a very short left main which immediately gives off a large LAD which wraps around the apex, a ramus intermediate vessel, and a very large dominant left circumflex coronary artery which gives off the PDA and PLA vessels and also extends to becomes the right coronary artery reaching almost the sinus of Valsalva.  An RV marginal branch is seen arising from this circumflex extension vessel;  in addition there is a very proximal left atrial circumflex branch which also extends and supplies a small RCA like branch.   Hyperdynamic LV function with suggestion of left ventricular hypertrophy.  EF estimate is 60 to 65%.  LVEDP is 14 mmHg.   RECOMMENDATION: Medical therapy.  Optimal blood pressure control with target blood pressure less than 130/80.  If patient cannot tolerate statin therapy or is unwilling to try, consider PCSK9 inhibition with familial hyperlipidemia.  Coronary CT  09/22/2018: IMPRESSION: 1. The patient's coronary artery calcium score is 130, which places the patient in the 65th percentile. This is greater than expected when compared to age and gender matched peers. Calcium seen in the left circumflex artery.   2. 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 severe proximal RCA  stenosis in a nondominant, diminutive vessel. Left dominant with collateral flow from left to right. Diminutive proximal RCA with reconstitution at the mid RCA by an anomalous RV branch and left sided collaterals.   3. Mild CAD in the left circumflex artery (25-49% stenosis), CADRADS = 2.   4. 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  EKG:  EKG is personally reviewed.   05/05/2023:  NSR at 73 bpm 07/16/2022:  NSR at 70 bpm  Recent Labs: 08/18/2022: TSH 1.10   Recent Lipid Panel    Component Value Date/Time   CHOL 244 (H) 07/02/2021 0845   TRIG 73 07/02/2021 0845   HDL 71 07/02/2021  0845   CHOLHDL 3.4 07/02/2021 0845   LDLCALC 161 (H) 07/02/2021 0845    Physical Exam:    VS:  BP 104/60 (BP Location: Left Arm, Patient Position: Sitting, Cuff Size: Large)   Pulse 73   Ht 5\' 4"  (1.626 m)   Wt 153 lb 14.4 oz (69.8 kg)   BMI 26.42 kg/m     Wt Readings from Last 3 Encounters:  05/05/23 153 lb 14.4 oz (69.8 kg)  02/18/23 155 lb (70.3 kg)  08/18/22 154 lb (69.9 kg)    GEN: Well nourished, well developed in no acute distress HEENT: Normal, moist mucous membranes NECK: No JVD CARDIAC: regular rhythm, normal S1 and S2, no rubs or gallops. 1/6 soft systolic murmur. VASCULAR: Radial and DP pulses 2+ bilaterally. No carotid bruits RESPIRATORY:  Clear to auscultation without rales, wheezing or rhonchi  ABDOMEN: Soft, non-tender, non-distended MUSCULOSKELETAL:  Ambulates independently SKIN: Warm and dry, no edema NEUROLOGIC:  Alert and oriented x 3. No focal neuro deficits noted. PSYCHIATRIC:  Normal affect    ASSESSMENT:    1. Nonobstructive atherosclerosis of coronary artery   2. CAD in native artery   3. Familial hyperlipidemia   4. Myalgia due to statin   5. Elevated lipoprotein(a)     PLAN:    Hyperlipidemia Nonobstructive CAD -elevated lp(a) of 328, declined clinical trial -elevated LDL, last 196. This is consistent with familial  hypercholesterolemia -genetic testing with VUS in ApoB, lp(a), SLC Z2A1 -statin intolerant due to myalgia -continue repatha  Cardiac risk counseling and prevention recommendations: -recommend heart healthy/Mediterranean diet, with whole grains, fruits, vegetable, fish, lean meats, nuts, and olive oil. Limit salt. -recommend moderate walking, 3-5 times/week for 30-50 minutes each session. Aim for at least 150 minutes.week. Goal should be pace of 3 miles/hours, or walking 1.5 miles in 30 minutes -recommend avoidance of tobacco products. Avoid excess alcohol.  Plan for follow up: 6 months or sooner as needed.  Jodelle Red, MD, PhD, [facility]   [facility]    I,Mathew Stumpf,acting as a scribe for Jodelle Red, MD.,have documented all relevant documentation on the behalf of Jodelle Red, MD,as directed by  Jodelle Red, MD while in the presence of Jodelle Red, MD.  I, Jodelle Red, MD, have reviewed all documentation for this visit. The documentation on 05/05/23 for the exam, diagnosis, procedures, and orders are all accurate and complete.   Signed, Jodelle Red, MD PhD 05/05/2023     [facility]

## 2023-05-05 NOTE — Patient Instructions (Signed)
Medication Instructions:  Your physician recommends that you continue on your current medications as directed. Please refer to the Current Medication list given to you today.   *If you need a refill on your cardiac medications before your next appointment, please call your pharmacy*  Lab Work: NONE  Testing/Procedures: NONE   Follow-Up: At [facility], you and your health needs are our priority.  As part of our continuing mission to provide you with exceptional heart care, we have created designated Provider Care Teams.  These Care Teams include your primary Cardiologist (physician) and Advanced Practice Providers (APPs -  Physician Assistants and Nurse Practitioners) who all work together to provide you with the care you need, when you need it.  We recommend signing up for the patient portal called "MyChart".  Sign up information is provided on this After Visit Summary.  MyChart is used to connect with patients for Virtual Visits (Telemedicine).  Patients are able to view lab/test results, encounter notes, upcoming appointments, etc.  Non-urgent messages can be sent to your provider as well.   To learn more about what you can do with MyChart, go to [email or web address].    Your next appointment:   6 month(s)  The format for your next appointment:   In Person  Provider:   Bridgette Christopher, MD             

## 2023-05-10 ENCOUNTER — Other Ambulatory Visit (HOSPITAL_BASED_OUTPATIENT_CLINIC_OR_DEPARTMENT_OTHER): Payer: Self-pay

## 2023-05-10 DIAGNOSIS — F4323 Adjustment disorder with mixed anxiety and depressed mood: Secondary | ICD-10-CM | POA: Diagnosis not present

## 2023-05-10 MED ORDER — AMOXICILLIN 500 MG PO CAPS
500.0000 mg | ORAL_CAPSULE | Freq: Three times a day (TID) | ORAL | 0 refills | Status: DC
  Filled 2023-05-10: qty 30, 10d supply, fill #0

## 2023-05-18 ENCOUNTER — Telehealth (HOSPITAL_BASED_OUTPATIENT_CLINIC_OR_DEPARTMENT_OTHER): Payer: Self-pay | Admitting: Cardiology

## 2023-05-18 ENCOUNTER — Other Ambulatory Visit: Payer: Self-pay

## 2023-05-18 ENCOUNTER — Other Ambulatory Visit: Payer: Self-pay | Admitting: Cardiology

## 2023-05-18 ENCOUNTER — Other Ambulatory Visit (HOSPITAL_BASED_OUTPATIENT_CLINIC_OR_DEPARTMENT_OTHER): Payer: Self-pay

## 2023-05-18 DIAGNOSIS — F4323 Adjustment disorder with mixed anxiety and depressed mood: Secondary | ICD-10-CM | POA: Diagnosis not present

## 2023-05-18 MED ORDER — REPATHA SURECLICK 140 MG/ML ~~LOC~~ SOAJ
140.0000 mg | SUBCUTANEOUS | 6 refills | Status: DC
  Filled 2023-05-18: qty 2, 28d supply, fill #0
  Filled 2023-06-17: qty 2, 28d supply, fill #1
  Filled 2023-07-10: qty 2, 28d supply, fill #2
  Filled 2023-08-15: qty 2, 28d supply, fill #3
  Filled 2023-09-08: qty 2, 28d supply, fill #4
  Filled 2023-10-06: qty 2, 28d supply, fill #5
  Filled 2023-11-29: qty 2, 28d supply, fill #6

## 2023-05-18 MED ORDER — REPATHA PUSHTRONEX SYSTEM 420 MG/3.5ML ~~LOC~~ SOCT
420.0000 mg | SUBCUTANEOUS | 0 refills | Status: DC
  Filled 2023-05-18: qty 3.5, 28d supply, fill #0

## 2023-05-18 NOTE — Telephone Encounter (Signed)
*  STAT* If patient is at the pharmacy, call can be transferred to refill team.   1. Which medications need to be refilled? (please list name of each medication and dose if known) Repatha Push Pin - no longer have this- wants to use  he regular Repatha  2. Which pharmacy/location (including street and city if local pharmacy) is medication to be sent to? [facility]   3. Do they need a 30 day or 90 day supply? 4 months

## 2023-05-18 NOTE — Telephone Encounter (Signed)
Please adivse. Pt wanting to use a different Repatha?

## 2023-05-18 NOTE — Telephone Encounter (Signed)
Okay to stop Repatha Pushtronex  Start Repatha 140mg  sureclick pen every 2 weeks   Alver Sorrow, NP

## 2023-05-18 NOTE — Telephone Encounter (Signed)
Rx request sent to pharmacy.  

## 2023-05-18 NOTE — Telephone Encounter (Signed)
Repatha Pushtronex discontinued and Rx for Repatha Sureclick sent to pharmacy per Gillian Shields, NP.

## 2023-05-21 ENCOUNTER — Other Ambulatory Visit: Payer: Self-pay

## 2023-05-21 ENCOUNTER — Observation Stay (HOSPITAL_COMMUNITY): Payer: Medicare PPO

## 2023-05-21 ENCOUNTER — Emergency Department (HOSPITAL_BASED_OUTPATIENT_CLINIC_OR_DEPARTMENT_OTHER): Payer: Medicare PPO

## 2023-05-21 ENCOUNTER — Observation Stay (HOSPITAL_BASED_OUTPATIENT_CLINIC_OR_DEPARTMENT_OTHER)
Admission: EM | Admit: 2023-05-21 | Discharge: 2023-05-22 | Disposition: A | Discharge status: Home / Self Care | Payer: Medicare PPO | Attending: Family Medicine | Admitting: Family Medicine

## 2023-05-21 ENCOUNTER — Encounter (HOSPITAL_BASED_OUTPATIENT_CLINIC_OR_DEPARTMENT_OTHER): Payer: Self-pay | Admitting: Emergency Medicine

## 2023-05-21 DIAGNOSIS — Z9049 Acquired absence of other specified parts of digestive tract: Secondary | ICD-10-CM | POA: Insufficient documentation

## 2023-05-21 DIAGNOSIS — Z79899 Other long term (current) drug therapy: Secondary | ICD-10-CM | POA: Diagnosis not present

## 2023-05-21 DIAGNOSIS — G20A1 Parkinson's disease without dyskinesia, without mention of fluctuations: Secondary | ICD-10-CM | POA: Insufficient documentation

## 2023-05-21 DIAGNOSIS — R109 Unspecified abdominal pain: Secondary | ICD-10-CM | POA: Diagnosis not present

## 2023-05-21 DIAGNOSIS — K56609 Unspecified intestinal obstruction, unspecified as to partial versus complete obstruction: Principal | ICD-10-CM | POA: Diagnosis present

## 2023-05-21 DIAGNOSIS — R8281 Pyuria: Secondary | ICD-10-CM | POA: Insufficient documentation

## 2023-05-21 DIAGNOSIS — E782 Mixed hyperlipidemia: Secondary | ICD-10-CM | POA: Diagnosis not present

## 2023-05-21 DIAGNOSIS — K6389 Other specified diseases of intestine: Secondary | ICD-10-CM | POA: Diagnosis not present

## 2023-05-21 DIAGNOSIS — R9431 Abnormal electrocardiogram [ECG] [EKG]: Secondary | ICD-10-CM | POA: Diagnosis not present

## 2023-05-21 DIAGNOSIS — R1084 Generalized abdominal pain: Secondary | ICD-10-CM | POA: Diagnosis present

## 2023-05-21 DIAGNOSIS — I700 Atherosclerosis of aorta: Secondary | ICD-10-CM | POA: Diagnosis not present

## 2023-05-21 DIAGNOSIS — K5669 Other partial intestinal obstruction: Secondary | ICD-10-CM | POA: Diagnosis not present

## 2023-05-21 HISTORY — DX: Parkinson's disease without dyskinesia, without mention of fluctuations: G20.A1

## 2023-05-21 LAB — CBC
HCT: 38.7 % (ref 36.0–46.0)
HCT: 39.8 % (ref 36.0–46.0)
Hemoglobin: 12.4 g/dL (ref 12.0–15.0)
Hemoglobin: 13.5 g/dL (ref 12.0–15.0)
MCH: 32.1 pg (ref 26.0–34.0)
MCH: 32.6 pg (ref 26.0–34.0)
MCHC: 32 g/dL (ref 30.0–36.0)
MCHC: 33.9 g/dL (ref 30.0–36.0)
MCV: 100.3 fL — ABNORMAL HIGH (ref 80.0–100.0)
MCV: 96.1 fL (ref 80.0–100.0)
Platelets: 275 10*3/uL (ref 150–400)
Platelets: 298 10*3/uL (ref 150–400)
RBC: 3.86 MIL/uL — ABNORMAL LOW (ref 3.87–5.11)
RBC: 4.14 MIL/uL (ref 3.87–5.11)
RDW: 13.2 % (ref 11.5–15.5)
RDW: 13.2 % (ref 11.5–15.5)
WBC: 10.7 10*3/uL — ABNORMAL HIGH (ref 4.0–10.5)
WBC: 12.3 10*3/uL — ABNORMAL HIGH (ref 4.0–10.5)
nRBC: 0 % (ref 0.0–0.2)
nRBC: 0 % (ref 0.0–0.2)

## 2023-05-21 LAB — URINALYSIS, ROUTINE W REFLEX MICROSCOPIC
Bilirubin Urine: NEGATIVE
Glucose, UA: NEGATIVE mg/dL
Ketones, ur: NEGATIVE mg/dL
Nitrite: NEGATIVE
Protein, ur: 30 mg/dL — AB
Specific Gravity, Urine: 1.022 (ref 1.005–1.030)
WBC, UA: >50 WBC/hpf (ref 0–5)
pH: 5.5 (ref 5.0–8.0)

## 2023-05-21 LAB — BASIC METABOLIC PANEL
Anion gap: 8 (ref 5–15)
BUN: 12 mg/dL (ref 8–23)
CO2: 26 mmol/L (ref 22–32)
Calcium: 9.9 mg/dL (ref 8.9–10.3)
Chloride: 104 mmol/L (ref 98–111)
Creatinine, Ser: 0.92 mg/dL (ref 0.44–1.00)
GFR, Estimated: >60 mL/min (ref >60)
Glucose, Bld: 125 mg/dL — ABNORMAL HIGH (ref 70–99)
Potassium: 3.7 mmol/L (ref 3.5–5.1)
Sodium: 138 mmol/L (ref 135–145)

## 2023-05-21 LAB — COMPREHENSIVE METABOLIC PANEL
ALT: 7 U/L (ref 0–44)
AST: 18 U/L (ref 15–41)
Albumin: 4.3 g/dL (ref 3.5–5.0)
Alkaline Phosphatase: 38 U/L (ref 38–126)
Anion gap: 11 (ref 5–15)
BUN: 12 mg/dL (ref 8–23)
CO2: 25 mmol/L (ref 22–32)
Calcium: 11.1 mg/dL — ABNORMAL HIGH (ref 8.9–10.3)
Chloride: 100 mmol/L (ref 98–111)
Creatinine, Ser: 1.04 mg/dL — ABNORMAL HIGH (ref 0.44–1.00)
GFR, Estimated: 54 mL/min — ABNORMAL LOW (ref >60)
Glucose, Bld: 149 mg/dL — ABNORMAL HIGH (ref 70–99)
Potassium: 3.6 mmol/L (ref 3.5–5.1)
Sodium: 136 mmol/L (ref 135–145)
Total Bilirubin: 0.7 mg/dL (ref 0.3–1.2)
Total Protein: 7.3 g/dL (ref 6.5–8.1)

## 2023-05-21 LAB — LIPASE, BLOOD: Lipase: 14 U/L (ref 11–51)

## 2023-05-21 MED ORDER — CARBIDOPA-LEVODOPA 25-100 MG PO TABS
1.5000 | ORAL_TABLET | Freq: Three times a day (TID) | ORAL | Status: DC
  Administered 2023-05-21 – 2023-05-22 (×5): 1.5 via ORAL
  Filled 2023-05-21 (×5): qty 2

## 2023-05-21 MED ORDER — ONDANSETRON HCL 4 MG/2ML IJ SOLN
4.0000 mg | Freq: Four times a day (QID) | INTRAMUSCULAR | Status: DC | PRN
  Administered 2023-05-21: 4 mg via INTRAVENOUS
  Filled 2023-05-21: qty 2

## 2023-05-21 MED ORDER — ACETAMINOPHEN 325 MG PO TABS: 650.0000 mg | ORAL_TABLET | Freq: Four times a day (QID) | ORAL | Status: DC | PRN

## 2023-05-21 MED ORDER — DIATRIZOATE MEGLUMINE & SODIUM 66-10 % PO SOLN
90.0000 mL | Freq: Once | ORAL | Status: AC
  Administered 2023-05-21: 90 mL via ORAL
  Filled 2023-05-21: qty 90

## 2023-05-21 MED ORDER — MORPHINE SULFATE (PF) 4 MG/ML IV SOLN
4.0000 mg | Freq: Once | INTRAVENOUS | Status: AC
  Administered 2023-05-21: 4 mg via INTRAVENOUS
  Filled 2023-05-21: qty 1

## 2023-05-21 MED ORDER — SODIUM CHLORIDE 0.9 % IV BOLUS
1000.0000 mL | Freq: Once | INTRAVENOUS | Status: AC
  Administered 2023-05-21: 1000 mL via INTRAVENOUS

## 2023-05-21 MED ORDER — ONDANSETRON HCL 4 MG PO TABS: 4.0000 mg | ORAL_TABLET | Freq: Four times a day (QID) | ORAL | Status: DC | PRN

## 2023-05-21 MED ORDER — IOHEXOL 300 MG/ML  SOLN
100.0000 mL | Freq: Once | INTRAMUSCULAR | Status: AC | PRN
  Administered 2023-05-21: 75 mL via INTRAVENOUS

## 2023-05-21 MED ORDER — ONDANSETRON HCL 4 MG/2ML IJ SOLN
4.0000 mg | Freq: Once | INTRAMUSCULAR | Status: AC
  Administered 2023-05-21: 4 mg via INTRAVENOUS
  Filled 2023-05-21: qty 2

## 2023-05-21 MED ORDER — SODIUM CHLORIDE 0.9 % IV SOLN
1.0000 g | Freq: Once | INTRAVENOUS | Status: AC
  Administered 2023-05-21: 1 g via INTRAVENOUS
  Filled 2023-05-21: qty 10

## 2023-05-21 MED ORDER — LACTATED RINGERS IV SOLN: INTRAVENOUS | Status: DC

## 2023-05-21 MED ORDER — MORPHINE SULFATE (PF) 2 MG/ML IV SOLN
2.0000 mg | INTRAVENOUS | Status: DC | PRN
  Administered 2023-05-21: 2 mg via INTRAVENOUS
  Filled 2023-05-21: qty 1

## 2023-05-21 MED ORDER — ACETAMINOPHEN 650 MG RE SUPP: 650.0000 mg | Freq: Four times a day (QID) | RECTAL | Status: DC | PRN

## 2023-05-21 MED ORDER — ENOXAPARIN SODIUM 40 MG/0.4ML IJ SOSY
40.0000 mg | PREFILLED_SYRINGE | INTRAMUSCULAR | Status: DC
  Administered 2023-05-21 – 2023-05-22 (×2): 40 mg via SUBCUTANEOUS
  Filled 2023-05-21 (×2): qty 0.4

## 2023-05-21 NOTE — ED Provider Notes (Signed)
[place] EMERGENCY DEPARTMENT AT [facility] Provider Note   CSN: [id number] Arrival date & time: 05/21/23  0017     History  Chief Complaint  Patient presents with   Abdominal Pain    Alexandria Wallace is a 82 y.o. female.  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  Patient presenting today with complaints of abdominal pain.  This started approximately 24 hours ago and has been worsening.  She describes crampy generalized pain that comes and goes.  She reports only small bowel movements and passing only minimal gas.  There has been no vomiting.  No fevers or chills.  No aggravating or alleviating factors.  The history is provided by the patient.       Home Medications Prior to Admission medications   Medication Sig Start Date End Date Taking? Authorizing Provider  acetaminophen (TYLENOL) 500 MG tablet Take 500 mg by mouth every 6 (six) hours as needed (for pain.).    [provider]  amoxicillin (AMOXIL) 500 MG capsule Take 1 capsule (500 mg total) by mouth every 8 (eight) hours. 05/10/23     Ascorbic Acid (VITAMIN C) 1000 MG tablet Take 1,000 mg by mouth every evening.    [provider]  busPIRone (BUSPAR) 10 MG tablet Take 1 tablet (10 mg total) by mouth 2 (two) times daily. 03/18/23     CALCIUM LACTATE PO Take 520 mg by mouth 2 (two) times daily.    [provider]  carbidopa-levodopa (SINEMET IR) 25-100 MG tablet Take 1.5 tablets by mouth 3 (three) times daily. 04/05/23   Tat, Octaviano Batty, DO  Cholecalciferol (VITAMIN D3) 75 MCG (3000 UT) TABS Take 3,000 Units by mouth daily.    [provider]  Coenzyme Q10 (CO Q10) 100 MG CAPS Take 100 mg by mouth every evening.     [provider]  COLLAGEN PO Take 3,000 mg by mouth daily at 12 noon.    [provider]  Evolocumab (REPATHA SURECLICK) 140 MG/ML SOAJ Inject 140 mg into the  skin every 14 (fourteen) days. 05/18/23   Jodelle Red, MD  FLUoxetine (PROZAC) 20 MG capsule Take 1 capsule (20 mg total) by mouth daily. 02/18/23     gabapentin (NEURONTIN) 100 MG capsule Take 1 capsule (100 mg total) by mouth 2 (two) times daily. 02/18/23   Tat, Octaviano Batty, DO  ibandronate (BONIVA) 150 MG tablet Take 1 tablet by mouth once a month 60 minutes before the first food, beverage or medicine of the day with plain water 02/25/23     nitroGLYCERIN (NITROSTAT) 0.4 MG SL tablet 1 tab at onset of chest pain, may repeat once after 5 minutes 11/09/18   [provider]  Omega-3 Fatty Acids (FISH OIL) 1000 MG CAPS Take 1,000 mg by mouth at bedtime.    [provider]  pantoprazole (PROTONIX) 40 MG tablet Take 1 tablet (40 mg total) by mouth daily before a meal 04/01/23     PSYLLIUM PO Take 1 tablet by mouth daily at 12 noon.    [provider]  TURMERIC CURCUMIN PO Take 250 mg by mouth daily at 12 noon.    [provider]  TURMERIC PO Take 448 mg by mouth every evening.    [provider]      Allergies    Statins and Chocolate    Review of Systems   Review of Systems  All other systems reviewed  and are negative.   Physical Exam Updated Vital Signs BP 127/81   Pulse 99   Temp 97.9 F (36.6 C) (Oral)   Resp 16   SpO2 96%  Physical Exam Vitals and nursing note reviewed.  Constitutional:      General: She is not in acute distress.    Appearance: She is well-developed. She is not diaphoretic.  HENT:     Head: Normocephalic and atraumatic.  Cardiovascular:     Rate and Rhythm: Normal rate and regular rhythm.     Heart sounds: No murmur heard.    No friction rub. No gallop.  Pulmonary:     Effort: Pulmonary effort is normal. No respiratory distress.     Breath sounds: Normal breath sounds. No wheezing.  Abdominal:     General: Bowel sounds are normal. There is no distension.     Palpations: Abdomen is soft.     Tenderness:  There is generalized abdominal tenderness. There is no right CVA tenderness, left CVA tenderness, guarding or rebound.  Musculoskeletal:        General: Normal range of motion.     Cervical back: Normal range of motion and neck supple.  Skin:    General: Skin is warm and dry.  Neurological:     General: No focal deficit present.     Mental Status: She is alert and oriented to person, place, and time.     ED Results / Procedures / Treatments   Labs (all labs ordered are listed, but only abnormal results are displayed) Labs Reviewed  CBC - Abnormal; Notable for the following components:      Result Value   WBC 12.3 (*)    All other components within normal limits  URINALYSIS, ROUTINE W REFLEX MICROSCOPIC - Abnormal; Notable for the following components:   APPearance HAZY (*)    Hgb urine dipstick MODERATE (*)    Protein, ur 30 (*)    Leukocytes,Ua LARGE (*)    Bacteria, UA RARE (*)    Non Squamous Epithelial 0-5 (*)    All other components within normal limits  LIPASE, BLOOD  COMPREHENSIVE METABOLIC PANEL    EKG EKG Interpretation  Date/Time:  Friday May 21 2023 00:34:49 EDT Ventricular Rate:  87 PR Interval:  153 QRS Duration: 94 QT Interval:  367 QTC Calculation: 442 R Axis:   76 Text Interpretation: Sinus rhythm Prominent P waves, nondiagnostic Minimal ST depression, diffuse leads No significant change since 06/06/2020 Confirmed by Geoffery Lyons ([id number]) on 05/21/2023 12:56:59 AM  Radiology No results found.  Procedures Procedures    Medications Ordered in ED Medications - No data to display  ED Course/ Medical Decision Making/ A&P  Patient is an 82 year old female with history of prior colon resection presenting with complaints of abdominal pain worsening over the past 24 hours.  She has not had any vomiting, but reports decreased flatus and stool.  Patient arrives here with stable vital signs and is afebrile.  There is tenderness to palpation throughout the  abdomen.  Workup initiated including CBC, metabolic panel, lipase, and urinalysis.  White count is 12.3, but electrolytes otherwise unremarkable.  Urinalysis consistent with UTI.  Patient has received IV fluids along with morphine for pain and Zofran for nausea.  I have also administered Rocephin for what appears to be a urinary tract infection.  Care discussed with Dr. Janee Morn from general surgery.  He has recommended admission to the hospitalist service, but will consult on the patient.  I spoken  with Dr. Imogene Burn who agrees to admit.  Final Clinical Impression(s) / ED Diagnoses Final diagnoses:  None    Rx / DC Orders ED Discharge Orders     None         Geoffery Lyons, MD 05/21/23 [phone]

## 2023-05-21 NOTE — Plan of Care (Signed)

## 2023-05-21 NOTE — ED Notes (Signed)
Patient transported to CT 

## 2023-05-21 NOTE — Assessment & Plan Note (Addendum)
Urine with LE and WBC, but no fever, chills, dysuria, etc. Got 1 dose of rocephin in ED Holding off on additional ABx for the moment Ordering urine culture. Today's abd symptoms seem more c/w SBO.

## 2023-05-21 NOTE — Consult Note (Signed)
Meda Dudzinski Kavanaugh 02-18-1941  [id number].    Requesting MD: Dr. Lyda Perone Chief Complaint/Reason for Consult: SBO  HPI: ERCILIA BETTINGER is a 82 y.o. female with a hx of HLD and parkinson's disease who presented to [facility] ED for abdominal pain. Patients reports on Wed, 6/19, she began having generalized abdominal pain and bloating. No fever, n/v. Pain persisted so she presented to the ED for evaluation. W/u c/w SBO. Admitted to [facility] and we were asked to see. Reports she did pass flatus yesterday and had 3 small bm's. Usually has 1-2 bm's daily. 2-3 minutes after Morphine she did become nauseated but otherwise has had no nausea. Pain has improved since presentation but is still present and generalized abdominal pain. No hx of SBO or similar symptoms. She has had a remote dx laparoscopy by GYN (reports they were looking at her ovaries). She also had a lap assisted LAR by Dr. Byrd Hesselbach in 2017 for proximal rectal polyp that is too large for endoscopic resection. Pathology revealed tubulovillous adenoma, margins free of adenoma and all 12 LNs negative for disease (per care everywhere notes). No other abdominal surgeries. She reports last colonoscopy was 2-3 years ago and "negative". I cannot see these results. Last scope I see is Flex ex in 2019 that showed patent end-to-side colo-rectal anastomosis with erythema, friable mucosa and hemorrhafic apperance. Exam was otherwise normal and no specimens were collected. She is not on blood thinners.   ROS: ROS As above, see hpi  Family History  Problem Relation Age of Onset   Heart attack Mother    Cancer Father    Cancer Sister 28   Breast cancer Daughter 46   Other Brother 10       meningitis     Past Medical History:  Diagnosis Date   Colon polyp    High cholesterol    Mood disorder (HCC)    Parkinson disease     Past Surgical History:  Procedure Laterality Date   COLECTOMY     EUS N/A 11/29/2015   Procedure: LOWER ENDOSCOPIC  ULTRASOUND (EUS);  Surgeon: Willis Modena, MD;  Location: [facility] ENDOSCOPY;  Service: Endoscopy;  Laterality: N/A;   FLEXIBLE SIGMOIDOSCOPY N/A 01/27/2018   Procedure: FLEXIBLE SIGMOIDOSCOPY;  Surgeon: Kathi Der, MD;  Location: [facility] ENDOSCOPY;  Service: Gastroenterology;  Laterality: N/A;   LEFT HEART CATH AND CORONARY ANGIOGRAPHY N/A 11/15/2018   Procedure: LEFT HEART CATH AND CORONARY ANGIOGRAPHY;  Surgeon: Lennette Bihari, MD;  Location: [facility] INVASIVE CV LAB;  Service: Cardiovascular;  Laterality: N/A;   POLYPECTOMY  02/19/2016    Social History:  reports that she has never smoked. She has never used smokeless tobacco. She reports current alcohol use of about 2.0 standard drinks of alcohol per week. She reports that she does not use drugs.  Allergies:  Allergies  Allergen Reactions   Statins Other (See Comments)    Myalgias   Chocolate     Stated by patient from blood work    Medications Prior to Admission  Medication Sig Dispense Refill   acetaminophen (TYLENOL) 500 MG tablet Take 500 mg by mouth every 6 (six) hours as needed (for pain.).     amoxicillin (AMOXIL) 500 MG capsule Take 1 capsule (500 mg total) by mouth every 8 (eight) hours. 30 capsule 0   Ascorbic Acid (VITAMIN C) 1000 MG tablet Take 1,000 mg by mouth every evening.     busPIRone (BUSPAR) 10 MG tablet Take 1 tablet (10 mg  total) by mouth 2 (two) times daily. 60 tablet 2   CALCIUM LACTATE PO Take 520 mg by mouth 2 (two) times daily.     carbidopa-levodopa (SINEMET IR) 25-100 MG tablet Take 1.5 tablets by mouth 3 (three) times daily. 403 tablet 0   Cholecalciferol (VITAMIN D3) 75 MCG (3000 UT) TABS Take 3,000 Units by mouth daily.     Coenzyme Q10 (CO Q10) 100 MG CAPS Take 100 mg by mouth every evening.      COLLAGEN PO Take 3,000 mg by mouth daily at 12 noon.     Evolocumab (REPATHA SURECLICK) 140 MG/ML SOAJ Inject 140 mg into the skin every 14 (fourteen) days. 2 mL 6   FLUoxetine (PROZAC) 20 MG capsule Take 1 capsule  (20 mg total) by mouth daily. 90 capsule 2   gabapentin (NEURONTIN) 100 MG capsule Take 1 capsule (100 mg total) by mouth 2 (two) times daily. 180 capsule 1   ibandronate (BONIVA) 150 MG tablet Take 1 tablet by mouth once a month 60 minutes before the first food, beverage or medicine of the day with plain water 3 tablet 3   nitroGLYCERIN (NITROSTAT) 0.4 MG SL tablet 1 tab at onset of chest pain, may repeat once after 5 minutes     Omega-3 Fatty Acids (FISH OIL) 1000 MG CAPS Take 1,000 mg by mouth at bedtime.     pantoprazole (PROTONIX) 40 MG tablet Take 1 tablet (40 mg total) by mouth daily before a meal 30 tablet 1   PSYLLIUM PO Take 1 tablet by mouth daily at 12 noon.     TURMERIC CURCUMIN PO Take 250 mg by mouth daily at 12 noon.     TURMERIC PO Take 448 mg by mouth every evening.       Physical Exam: Blood pressure 110/65, pulse 76, temperature 98.4 F (36.9 C), resp. rate 17, height 5\' 4"  (1.626 m), weight 70.4 kg, SpO2 95 %. General: pleasant, WD/WN elderly female who is laying in bed in NAD HEENT: head is normocephalic, atraumatic.  Sclera are noninjected.  PERRL.  Ears and nose without any masses or lesions.  Mouth is pink and moist. Dentition fair Heart: regular, rate, and rhythm.  Palpable pedal pulses bilaterally  Lungs: CTAB, no wheezes, rhonchi, or rales noted.  Respiratory effort nonlabored Abd:  Soft, mild distension, mild generalized ttp without rigidity or guarding. +BS. No masses, hernias, or organomegaly MS: no BUE or BLE edema Skin: warm and dry  Psych: A&Ox4 with an appropriate affect Neuro: cranial nerves grossly intact, normal speech, thought process intact, moves all extremities, gait not assessed   Results for orders placed or performed during the hospital encounter of 05/21/23 (from the past 48 hour(s))  Lipase, blood     Status: None   Collection Time: 05/21/23 12:42 AM  Result Value Ref Range   Lipase 14 11 - 51 U/L    Comment: Performed at [facility], [address], [place], [place] [address]  Comprehensive metabolic panel     Status: Abnormal   Collection Time: 05/21/23 12:42 AM  Result Value Ref Range   Sodium 136 135 - 145 mmol/L   Potassium 3.6 3.5 - 5.1 mmol/L   Chloride 100 98 - 111 mmol/L   CO2 25 22 - 32 mmol/L   Glucose, Bld 149 (H) 70 - 99 mg/dL    Comment: Glucose reference range applies only to samples taken after fasting for at least 8 hours.   BUN 12 8 -  23 mg/dL   Creatinine, Ser 1.61 (H) 0.44 - 1.00 mg/dL   Calcium 09.6 (H) 8.9 - 10.3 mg/dL   Total Protein 7.3 6.5 - 8.1 g/dL   Albumin 4.3 3.5 - 5.0 g/dL   AST 18 15 - 41 U/L   ALT 7 0 - 44 U/L   Alkaline Phosphatase 38 38 - 126 U/L   Total Bilirubin 0.7 0.3 - 1.2 mg/dL   GFR, Estimated 54 (L) >60 mL/min    Comment: (NOTE) Calculated using the CKD-EPI Creatinine Equation (2021)    Anion gap 11 5 - 15    Comment: Performed at [facility], [address] [place], [place] [address]  CBC     Status: Abnormal   Collection Time: 05/21/23 12:42 AM  Result Value Ref Range   WBC 12.3 (H) 4.0 - 10.5 K/uL   RBC 4.14 3.87 - 5.11 MIL/uL   Hemoglobin 13.5 12.0 - 15.0 g/dL   HCT 98.1 19.1 - 47.8 %   MCV 96.1 80.0 - 100.0 fL   MCH 32.6 26.0 - 34.0 pg   MCHC 33.9 30.0 - 36.0 g/dL   RDW 29.5 62.1 - 30.8 %   Platelets 298 150 - 400 K/uL   nRBC 0.0 0.0 - 0.2 %    Comment: Performed at [facility], [address] [place], [place] [address]  Urinalysis, Routine w reflex microscopic -Urine, Clean Catch     Status: Abnormal   Collection Time: 05/21/23 12:44 AM  Result Value Ref Range   Color, Urine YELLOW YELLOW   APPearance HAZY (A) CLEAR   Specific Gravity, Urine 1.022 1.005 - 1.030   pH 5.5 5.0 - 8.0   Glucose, UA NEGATIVE NEGATIVE mg/dL   Hgb urine dipstick MODERATE (A) NEGATIVE   Bilirubin Urine NEGATIVE NEGATIVE   Ketones, ur NEGATIVE NEGATIVE mg/dL   Protein, ur 30 (A) NEGATIVE mg/dL   Nitrite  NEGATIVE NEGATIVE   Leukocytes,Ua LARGE (A) NEGATIVE   RBC / HPF 11-20 0 - 5 RBC/hpf   WBC, UA >50 0 - 5 WBC/hpf   Bacteria, UA RARE (A) NONE SEEN   Squamous Epithelial / HPF 0-5 0 - 5 /HPF   WBC Clumps PRESENT    Mucus PRESENT    Hyaline Casts, UA PRESENT    Non Squamous Epithelial 0-5 (A) NONE SEEN    Comment: Performed at [facility], [address] [place], [place], [place] [address]  CBC     Status: Abnormal   Collection Time: 05/21/23  4:03 AM  Result Value Ref Range   WBC 10.7 (H) 4.0 - 10.5 K/uL   RBC 3.86 (L) 3.87 - 5.11 MIL/uL   Hemoglobin 12.4 12.0 - 15.0 g/dL   HCT 52.8 41.3 - 24.4 %   MCV 100.3 (H) 80.0 - 100.0 fL   MCH 32.1 26.0 - 34.0 pg   MCHC 32.0 30.0 - 36.0 g/dL   RDW 01.0 27.2 - 53.6 %   Platelets 275 150 - 400 K/uL   nRBC 0.0 0.0 - 0.2 %    Comment: Performed at [facility], [address], [place], [place] [address]  Basic metabolic panel     Status: Abnormal   Collection Time: 05/21/23  4:03 AM  Result Value Ref Range   Sodium 138 135 - 145 mmol/L   Potassium 3.7 3.5 - 5.1 mmol/L   Chloride 104 98 - 111 mmol/L   CO2 26 22 - 32 mmol/L   Glucose, Bld 125 (H)  70 - 99 mg/dL    Comment: Glucose reference range applies only to samples taken after fasting for at least 8 hours.   BUN 12 8 - 23 mg/dL   Creatinine, Ser 6.04 0.44 - 1.00 mg/dL   Calcium 9.9 8.9 - 54.0 mg/dL   GFR, Estimated >98 >11 mL/min    Comment: (NOTE) Calculated using the CKD-EPI Creatinine Equation (2021)    Anion gap 8 5 - 15    Comment: Performed at [facility], [address], [place], [place] [address]   CT ABDOMEN PELVIS W CONTRAST  Result Date: 05/21/2023 CLINICAL DATA:  Acute abdominal pain EXAM: CT ABDOMEN AND PELVIS WITH CONTRAST TECHNIQUE: Multidetector CT imaging of the abdomen and pelvis was performed using the standard protocol following bolus administration of intravenous contrast. RADIATION DOSE REDUCTION: This exam was  performed according to the departmental dose-optimization program which includes automated exposure control, adjustment of the mA and/or kV according to patient size and/or use of iterative reconstruction technique. CONTRAST:  75mL OMNIPAQUE IOHEXOL 300 MG/ML  SOLN COMPARISON:  None Available. FINDINGS: Lower Chest: Normal. Hepatobiliary: Scattered hypodense foci, too small to characterize accurately, but most likely hepatic cysts. Otherwise normal liver. No biliary dilatation. Normal gallbladder. Pancreas: Normal pancreas. No ductal dilatation or peripancreatic fluid collection. Spleen: Normal. Adrenals/Urinary Tract: The adrenal glands are normal. No hydronephrosis, nephroureterolithiasis or solid renal mass. The urinary bladder is normal for degree of distention Stomach/Bowel: There are mildly dilated fluid-filled small bowel loops throughout the left half of the abdomen. This includes a loop with fecalized contents pelvis, which is the area of transition. There is postsurgical change at the rectum, which is near the obstructed bowel segment Vascular/Lymphatic: There is calcific atherosclerosis of the abdominal aorta. No lymphadenopathy. Reproductive: Normal uterus. No adnexal mass. Other: None. Musculoskeletal: No bony spinal canal stenosis or focal osseous abnormality. IMPRESSION: 1. Mildly dilated fluid-filled small bowel loops throughout the left half of the abdomen, with area of transition in the pelvis, consistent with small bowel obstruction. 2. Postsurgical change at the rectum, which is near the obstructed bowel segment. Aortic Atherosclerosis (ICD10-I70.0). Electronically Signed   By: Deatra Robinson M.D.   On: 05/21/2023 01:44    Anti-infectives (From admission, onward)    Start     Dose/Rate Route Frequency Ordered Stop   05/21/23 0230  cefTRIAXone (ROCEPHIN) 1 g in sodium chloride 0.9 % 100 mL IVPB        1 g 200 mL/hr over 30 Minutes Intravenous  Once 05/21/23 0215 05/21/23 0316        Assessment/Plan SBO - CT w/ dilated small bowel loops throughout the left half of the abdomen with area of transition in the pelvis. Patient with remote hx of diagnostic laparoscopy by GYN and hx lap assisted LAR by Dr. Byrd Hesselbach in 2017 - HDS without fever, tachycardia or hypotension. No peritonitis on exam. WBC downtrending at 10.7 (there is also question of UTI). No current indication for emergency surgery - She currently is without nausea. Can hold off on NGT. If develops any worsening abdominal pain, distension, n/v would recommended NGT placement.  - Start SBO protocol orally - Keep K > 4, Mg > 2 and mobilize as able for bowel function - Hopefully patient will improve with conservative management. If patient fails to improve with conservative management, they may require exploratory surgery during admission - Agree with medical admission. We will follow with you. - RN updated at beside. Patients daughter updated over speaker phone while  I was in the room.   FEN - NPO, IVF per TRH VTE - SCDs, okay for chem ppx from our standpoint ID - Rocephin for possible UTI. UCx pending. Does not need any abx from an intra-abdominal standpoint  I reviewed nursing notes, ED provider notes, last 24 h vitals and pain scores, last 48 h intake and output, last 24 h labs and trends, and last 24 h imaging results.  Jacinto Halim, [facility] Surgery 05/21/2023, 7:13 AM Please see Amion for pager number during day hours 7:00am-4:30pm

## 2023-05-21 NOTE — Progress Notes (Signed)
   Patient Name: Alexandria Wallace, neace DOB: June 12, 1941 MRN: [id number] Transferring facility: [facility] Requesting provider: Judd Lien, MD Reason for transfer: SBO Going to: [facility] 83 yo WF with hx of colon resection(2017) with 24 hour of abd pain/cramping.  CT shows SBO near end of bowel resection.  EDP discussed with Dr. Janee Morn with general surgery. pt without N/V. NGT not placed for now.  General surgery will see patient in AM Admission Status: obs Bed Type: med/surg To Do:  [facility] will assume care on arrival to accepting facility. Until arrival, medical decision making responsibilities remain with the EDP.  However, [facility] available 24/7 for questions and assistance.   Nursing staff please page [facility] Admits and Consults [phone]) as soon as the patient arrives to the hospital.  Carollee Herter, DO [facility]

## 2023-05-21 NOTE — Assessment & Plan Note (Signed)
Cont repatha once med rec completed.

## 2023-05-21 NOTE — ED Triage Notes (Signed)
Generalized abdo pain Worse in upper abd Started last night, getting worse Denies n/v/d

## 2023-05-21 NOTE — Progress Notes (Signed)
  Progress Note   Patient: Alexandria Wallace ZOX:[id number] DOB: 08-05-1941 DOA: 05/21/2023     0 DOS: the patient was seen and examined on 05/21/2023   Brief hospital course: 82 year old woman presented with abdominal pain and cramping.  Admitted for small bowel obstruction.  Assessment and Plan: * SBO (small bowel obstruction) (HCC) Clinically better.  Bowels moving.  Continue small bowel protocol as per general surgery.  Management per general surgery.    Pyuria Asymptomatic.  No further antibiotics indicated.  Parkinson's disease Continue Sinemet  Mixed hyperlipidemia     Subjective:  Multiple liquid BM No pain  Physical Exam: Vitals:   05/21/23 0030 05/21/23 0033 05/21/23 0342 05/21/23 0642  BP: 127/81  138/76 110/65  Pulse: 99  79 76  Resp:  16 18 17   Temp:  97.9 F (36.6 C) 97.8 F (36.6 C) 98.4 F (36.9 C)  TempSrc:  Oral    SpO2: 96%  97% 95%  Weight:   70.4 kg   Height:   5\' 4"  (1.626 m)    Physical Exam Vitals reviewed.  Constitutional:      General: She is not in acute distress.    Appearance: She is not ill-appearing or toxic-appearing.  Cardiovascular:     Rate and Rhythm: Normal rate and regular rhythm.     Heart sounds: No murmur heard. Pulmonary:     Effort: Pulmonary effort is normal. No respiratory distress.     Breath sounds: No wheezing, rhonchi or rales.  Neurological:     Mental Status: She is alert.  Psychiatric:        Mood and Affect: Mood normal.        Behavior: Behavior normal.     Data Reviewed: BMP noted LFTs noted WBC 12.3 > 10.7 Pyuria noted CT abd/pelvis noted EKG nonacute  Family Communication: none  Disposition: Status is: Observation   Planned Discharge Destination: Home    Time spent: 25 minutes  Author: Brendia Sacks, MD 05/21/2023 7:08 PM  For on call review [email or web address].

## 2023-05-21 NOTE — ED Notes (Signed)
Attempted to give reportx1 

## 2023-05-21 NOTE — Assessment & Plan Note (Signed)
Cont sinemet ?

## 2023-05-21 NOTE — ED Notes (Signed)
[facility] at facility for transport 

## 2023-05-21 NOTE — Hospital Course (Addendum)
82 year old woman presented with abdominal pain and cramping.  Admitted for small bowel obstruction.

## 2023-05-21 NOTE — Assessment & Plan Note (Addendum)
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 NPO IVF No N/V, holding off on NGT for now Morphine and zofran PRN Gen surg to see pt in AM in consult.

## 2023-05-21 NOTE — TOC Initial Note (Signed)
Transition of Care [facility]) - Initial/Assessment Note   Patient Details  Name: Alexandria Wallace MRN: [id number] Date of Birth: 10/09/1941  Transition of [facility]) CM/SW Contact:    Ewing Schlein, LCSW Phone Number: 05/21/2023, 10:07 AM  Clinical Narrative: TOC following for possible discharge needs.                Expected Discharge Plan: Home/Self Care Barriers to Discharge: Continued Medical Work up  Expected Discharge Plan and Services In-house Referral: Clinical Social Work  Prior Living Arrangements/Services Lives with:: Self Patient language and need for interpreter reviewed:: Yes Need for Family Participation in Patient Care: No (Comment) Care giver support system in place?: Yes (comment) Criminal Activity/Legal Involvement Pertinent to Current Situation/Hospitalization: No - Comment as needed  Activities of Daily Living Home Assistive Devices/Equipment: None ADL Screening (condition at time of admission) Patient's cognitive ability adequate to safely complete daily activities?: Yes Is the patient deaf or have difficulty hearing?: No Does the patient have difficulty seeing, even when wearing glasses/contacts?: No Does the patient have difficulty concentrating, remembering, or making decisions?: No Patient able to express need for assistance with ADLs?: No Does the patient have difficulty dressing or bathing?: No Independently performs ADLs?: Yes (appropriate for developmental age) Does the patient have difficulty walking or climbing stairs?: No Weakness of Legs: None Weakness of Arms/Hands: None  Emotional Assessment Alcohol / Substance Use: Not Applicable Psych Involvement: No (comment)  Admission diagnosis:  Small bowel obstruction (HCC) [K56.609] SBO (small bowel obstruction) (HCC) [K56.609] Patient Active Problem List   Diagnosis Date Noted   SBO (small bowel obstruction) (HCC) 05/21/2023   Pyuria 05/21/2023   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   Parkinson's disease 02/04/2022   Dyspnea    Agatston coronary artery calcium score between 100 and 199    Murmur 11/11/2016   Mixed hyperlipidemia 11/11/2016   PCP:  Gweneth Dimitri, MD Pharmacy:   [facility] [address] [place] [address] Phone: [phone] Fax: [phone]  Social Determinants of Health (SDOH) Social History: SDOH Screenings   Food Insecurity: No Food Insecurity (05/21/2023)  Housing: Low Risk  (05/21/2023)  Transportation Needs: No Transportation Needs (05/21/2023)  Utilities: Not At Risk (05/21/2023)  Tobacco Use: Low Risk  (05/21/2023)   SDOH Interventions:    Readmission Risk Interventions     No data to display

## 2023-05-21 NOTE — H&P (Signed)
History and Physical    Patient: Alexandria Wallace:[id number] DOB: 1941/06/22 DOA: 05/21/2023 DOS: the patient was seen and examined on 05/21/2023 PCP: Gweneth Dimitri, MD  Patient coming from: Home  Chief Complaint:  Chief Complaint  Patient presents with   Abdominal Pain   HPI: Alexandria Wallace is a 82 y.o. 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  Pt in to ED with c/o 24h of abd pain, cramping.  No N/V.  Does have decreased BMs and only minimal flatus.  No fevers, chills.  No dysuria.   Review of Systems: As mentioned in the history of present illness. All other systems reviewed and are negative. Past Medical History:  Diagnosis Date   Colon polyp    High cholesterol    Mood disorder (HCC)    Parkinson disease    Past Surgical History:  Procedure Laterality Date   COLECTOMY     EUS N/A 11/29/2015   Procedure: LOWER ENDOSCOPIC ULTRASOUND (EUS);  Surgeon: Willis Modena, MD;  Location: [facility] ENDOSCOPY;  Service: Endoscopy;  Laterality: N/A;   FLEXIBLE SIGMOIDOSCOPY N/A 01/27/2018   Procedure: FLEXIBLE SIGMOIDOSCOPY;  Surgeon: Kathi Der, MD;  Location: [facility] ENDOSCOPY;  Service: Gastroenterology;  Laterality: N/A;   LEFT HEART CATH AND CORONARY ANGIOGRAPHY N/A 11/15/2018   Procedure: LEFT HEART CATH AND CORONARY ANGIOGRAPHY;  Surgeon: Lennette Bihari, MD;  Location: [facility] INVASIVE CV LAB;  Service: Cardiovascular;  Laterality: N/A;   POLYPECTOMY  02/19/2016   Social History:  reports that she has never smoked. She has never used smokeless tobacco. She reports current alcohol use of about 2.0 standard drinks of alcohol per week. She reports that she does not use drugs.  Allergies  Allergen Reactions   Statins Other (See Comments)    Myalgias   Chocolate     Stated by patient from blood work    Family History  Problem Relation Age of Onset   Heart attack Mother     Cancer Father    Cancer Sister 69   Breast cancer Daughter 75   Other Brother 10       meningitis     Prior to Admission medications   Medication Sig Start Date End Date Taking? Authorizing Provider  acetaminophen (TYLENOL) 500 MG tablet Take 500 mg by mouth every 6 (six) hours as needed (for pain.).    [provider]  amoxicillin (AMOXIL) 500 MG capsule Take 1 capsule (500 mg total) by mouth every 8 (eight) hours. 05/10/23     Ascorbic Acid (VITAMIN C) 1000 MG tablet Take 1,000 mg by mouth every evening.    [provider]  busPIRone (BUSPAR) 10 MG tablet Take 1 tablet (10 mg total) by mouth 2 (two) times daily. 03/18/23     CALCIUM LACTATE PO Take 520 mg by mouth 2 (two) times daily.    [provider]  carbidopa-levodopa (SINEMET IR) 25-100 MG tablet Take 1.5 tablets by mouth 3 (three) times daily. 04/05/23   Tat, Octaviano Batty, DO  Cholecalciferol (VITAMIN D3) 75 MCG (3000 UT) TABS Take 3,000 Units by mouth daily.    [provider]  Coenzyme Q10 (CO Q10) 100 MG CAPS Take 100 mg by mouth every evening.     [provider]  COLLAGEN PO Take 3,000 mg by mouth daily at 12 noon.    [provider]  Evolocumab (REPATHA SURECLICK) 140 MG/ML SOAJ Inject 140 mg into the  skin every 14 (fourteen) days. 05/18/23   Jodelle Red, MD  FLUoxetine (PROZAC) 20 MG capsule Take 1 capsule (20 mg total) by mouth daily. 02/18/23     gabapentin (NEURONTIN) 100 MG capsule Take 1 capsule (100 mg total) by mouth 2 (two) times daily. 02/18/23   Tat, Octaviano Batty, DO  ibandronate (BONIVA) 150 MG tablet Take 1 tablet by mouth once a month 60 minutes before the first food, beverage or medicine of the day with plain water 02/25/23     nitroGLYCERIN (NITROSTAT) 0.4 MG SL tablet 1 tab at onset of chest pain, may repeat once after 5 minutes 11/09/18   [provider]  Omega-3 Fatty Acids (FISH OIL) 1000 MG CAPS Take 1,000 mg by mouth at bedtime.    [provider]  pantoprazole (PROTONIX) 40 MG tablet Take 1 tablet (40 mg total) by mouth daily before a meal 04/01/23     PSYLLIUM PO Take 1 tablet by mouth daily at 12 noon.    [provider]  TURMERIC CURCUMIN PO Take 250 mg by mouth daily at 12 noon.    [provider]  TURMERIC PO Take 448 mg by mouth every evening.    [provider]    Physical Exam: Vitals:   05/21/23 0030 05/21/23 0033 05/21/23 0342  BP: 127/81  138/76  Pulse: 99  79  Resp:  16 18  Temp:  97.9 F (36.6 C) 97.8 F (36.6 C)  TempSrc:  Oral   SpO2: 96%  97%   Constitutional: NAD, calm, comfortable Respiratory: clear to auscultation bilaterally, no wheezing, no crackles. Normal respiratory effort. No accessory muscle use.  Cardiovascular: Regular rate and rhythm, no murmurs / rubs / gallops. No extremity edema. 2+ pedal pulses. No carotid bruits.  Abdomen: Generalized TTP, no guarding nor rebound. Neurologic: CN 2-12 grossly intact. Sensation intact, DTR normal. Strength 5/5 in all 4.  Psychiatric: Normal judgment and insight. Alert and oriented x 3. Normal mood.   Data Reviewed:    Labs on Admission: I have personally reviewed following labs and imaging studies  CBC: Recent Labs  Lab 05/21/23 0042  WBC 12.3*  HGB 13.5  HCT 39.8  MCV 96.1  PLT 298   Basic Metabolic Panel: Recent Labs  Lab 05/21/23 0042  NA 136  K 3.6  CL 100  CO2 25  GLUCOSE 149*  BUN 12  CREATININE 1.04*  CALCIUM 11.1*   GFR: CrCl cannot be calculated (Unknown ideal weight.). Liver Function Tests: Recent Labs  Lab 05/21/23 0042  AST 18  ALT 7  ALKPHOS 38  BILITOT 0.7  PROT 7.3  ALBUMIN 4.3   Recent Labs  Lab 05/21/23 0042  LIPASE 14   No results for input(s): "AMMONIA" in the last 168 hours. Coagulation Profile: No results for input(s): "INR", "PROTIME" in the last 168 hours. Cardiac Enzymes: No results for input(s): "CKTOTAL", "CKMB", "CKMBINDEX", "TROPONINI" in the last 168  hours. BNP (last 3 results) No results for input(s): "PROBNP" in the last 8760 hours. HbA1C: No results for input(s): "HGBA1C" in the last 72 hours. CBG: No results for input(s): "GLUCAP" in the last 168 hours. Lipid Profile: No results for input(s): "CHOL", "HDL", "LDLCALC", "TRIG", "CHOLHDL", "LDLDIRECT" in the last 72 hours. Thyroid Function Tests: No results for input(s): "TSH", "T4TOTAL", "FREET4", "T3FREE", "THYROIDAB" in the last 72 hours. Anemia Panel: No results for input(s): "VITAMINB12", "FOLATE", "FERRITIN", "TIBC", "IRON", "RETICCTPCT" in the last 72 hours. Urine analysis:    Component Value  Date/Time   COLORURINE YELLOW 05/21/2023 0044   APPEARANCEUR HAZY (A) 05/21/2023 0044   LABSPEC 1.022 05/21/2023 0044   PHURINE 5.5 05/21/2023 0044   GLUCOSEU NEGATIVE 05/21/2023 0044   HGBUR MODERATE (A) 05/21/2023 0044   BILIRUBINUR NEGATIVE 05/21/2023 0044   KETONESUR NEGATIVE 05/21/2023 0044   PROTEINUR 30 (A) 05/21/2023 0044   NITRITE NEGATIVE 05/21/2023 0044   LEUKOCYTESUR LARGE (A) 05/21/2023 0044    Radiological Exams on Admission: CT ABDOMEN PELVIS W CONTRAST  Result Date: 05/21/2023 CLINICAL DATA:  Acute abdominal pain EXAM: CT ABDOMEN AND PELVIS WITH CONTRAST TECHNIQUE: Multidetector CT imaging of the abdomen and pelvis was performed using the standard protocol following bolus administration of intravenous contrast. RADIATION DOSE REDUCTION: This exam was performed according to the departmental dose-optimization program which includes automated exposure control, adjustment of the mA and/or kV according to patient size and/or use of iterative reconstruction technique. CONTRAST:  75mL OMNIPAQUE IOHEXOL 300 MG/ML  SOLN COMPARISON:  None Available. FINDINGS: Lower Chest: Normal. Hepatobiliary: Scattered hypodense foci, too small to characterize accurately, but most likely hepatic cysts. Otherwise normal liver. No biliary dilatation. Normal gallbladder. Pancreas: Normal  pancreas. No ductal dilatation or peripancreatic fluid collection. Spleen: Normal. Adrenals/Urinary Tract: The adrenal glands are normal. No hydronephrosis, nephroureterolithiasis or solid renal mass. The urinary bladder is normal for degree of distention Stomach/Bowel: There are mildly dilated fluid-filled small bowel loops throughout the left half of the abdomen. This includes a loop with fecalized contents pelvis, which is the area of transition. There is postsurgical change at the rectum, which is near the obstructed bowel segment Vascular/Lymphatic: There is calcific atherosclerosis of the abdominal aorta. No lymphadenopathy. Reproductive: Normal uterus. No adnexal mass. Other: None. Musculoskeletal: No bony spinal canal stenosis or focal osseous abnormality. IMPRESSION: 1. Mildly dilated fluid-filled small bowel loops throughout the left half of the abdomen, with area of transition in the pelvis, consistent with small bowel obstruction. 2. Postsurgical change at the rectum, which is near the obstructed bowel segment. Aortic Atherosclerosis (ICD10-I70.0). Electronically Signed   By: Deatra Robinson M.D.   On: 05/21/2023 01:44    EKG: Independently reviewed.   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 NPO IVF No N/V, holding off on NGT for now Morphine and zofran PRN Gen surg to see pt in AM in consult.  Pyuria Urine with LE and WBC, but no fever, chills, dysuria, etc. Got 1 dose of rocephin in ED Holding off on additional ABx for the moment Ordering urine culture. Today's abd symptoms seem more c/w SBO.  Parkinson's disease Cont sinemet  Mixed hyperlipidemia Cont repatha once med rec completed.      Advance Care Planning:   Code Status: Full Code  Consults: EDP d/w Dr. Janee Morn, gen surg will consult in AM  Family  Communication: No family in room  Severity of Illness: The appropriate patient status for this patient is OBSERVATION. Observation status is judged to be reasonable and necessary in order to provide the required intensity of service to ensure the patient's safety. The patient's presenting symptoms, physical exam findings, and initial radiographic and laboratory data in the context of their medical condition is felt to place them at decreased risk for further clinical deterioration. Furthermore, it is anticipated that the patient will be medically stable for discharge from the hospital within 2 midnights of admission.   Author: Hillary Bow.,  DO 05/21/2023 4:01 AM  For on call review [email or web address].

## 2023-05-22 DIAGNOSIS — G20A1 Parkinson's disease without dyskinesia, without mention of fluctuations: Secondary | ICD-10-CM | POA: Diagnosis not present

## 2023-05-22 DIAGNOSIS — K5669 Other partial intestinal obstruction: Secondary | ICD-10-CM | POA: Diagnosis not present

## 2023-05-22 DIAGNOSIS — R8281 Pyuria: Secondary | ICD-10-CM | POA: Diagnosis not present

## 2023-05-22 DIAGNOSIS — K56609 Unspecified intestinal obstruction, unspecified as to partial versus complete obstruction: Secondary | ICD-10-CM | POA: Diagnosis not present

## 2023-05-22 LAB — URINE CULTURE: Culture: 10000 — AB

## 2023-05-22 MED ORDER — IBUPROFEN 400 MG PO TABS
600.0000 mg | ORAL_TABLET | Freq: Four times a day (QID) | ORAL | Status: DC | PRN
  Administered 2023-05-22: 600 mg via ORAL
  Filled 2023-05-22: qty 1

## 2023-05-22 NOTE — Discharge Summary (Signed)
Physician Discharge Summary   Patient: Alexandria Wallace MRN: [id number] DOB: January 14, 1941  Admit date:     05/21/2023  Discharge date: 05/22/23  Discharge Physician: Brendia Sacks   PCP: Gweneth Dimitri, MD   Recommendations at discharge:   Follow-up small bowel obstruction  Discharge Diagnoses: Principal Problem:   SBO (small bowel obstruction) (HCC) Active Problems:   Pyuria   Mixed hyperlipidemia   Parkinson's disease  Resolved Problems:   * No resolved hospital problems. *  Hospital Course: 82 year old woman presented with abdominal pain and cramping.  Admitted for small bowel obstruction.  Seen by general surgery.  Treated with small bowel protocol with spontaneous resolution.  * SBO (small bowel obstruction) (HCC) Clinically better.  Tolerating diet.  Bowels moving.  Cleared by general surgery for discharge.   Pyuria Asymptomatic.  No further antibiotics indicated.   Parkinson's disease Continue Sinemet   Mixed hyperlipidemia  Consultants:  General surgery  Procedures performed:  None   Disposition: Home Diet recommendation:  Soft diet  DISCHARGE MEDICATION: Allergies as of 05/22/2023       Reactions   Statins Other (See Comments)   Myalgias   Chocolate    Stated by patient from blood work        Medication List     STOP taking these medications    amoxicillin 500 MG capsule Commonly known as: AMOXIL   gabapentin 100 MG capsule Commonly known as: Neurontin       TAKE these medications    acetaminophen 500 MG tablet Commonly known as: TYLENOL Take 500 mg by mouth every 6 (six) hours as needed (for pain.).   busPIRone 10 MG tablet Commonly known as: BUSPAR Take 1 tablet (10 mg total) by mouth 2 (two) times daily.   CALCIUM LACTATE PO Take 520 mg by mouth 2 (two) times daily.   carbidopa-levodopa 25-100 MG tablet Commonly known as: SINEMET IR Take 1.5 tablets by mouth 3 (three) times daily.   Co Q10 100 MG Caps Take 100 mg  by mouth every evening.   FLUoxetine 20 MG capsule Commonly known as: PROZAC Take 1 capsule (20 mg total) by mouth daily.   ibandronate 150 MG tablet Commonly known as: BONIVA Take 1 tablet by mouth once a month 60 minutes before the first food, beverage or medicine of the day with plain water   nitroGLYCERIN 0.4 MG SL tablet Commonly known as: NITROSTAT Place 0.4 mg under the tongue every 5 (five) minutes as needed for chest pain.   pantoprazole 40 MG tablet Commonly known as: PROTONIX Take 1 tablet (40 mg total) by mouth daily before a meal What changed:  when to take this reasons to take this   Repatha SureClick 140 MG/ML Soaj Generic drug: Evolocumab Inject 140 mg into the skin every 14 (fourteen) days.   Vitamin D3 75 MCG (3000 UT) Tabs Take 3,000 Units by mouth daily.        Follow-up Information     Gweneth Dimitri, MD Follow up.   Specialty: Family Medicine Why: As needed Contact information: [address] [place] [address] [phone]                Feels better No pain Tolerating diet  Discharge Exam: Ceasar Mons Weights   05/21/23 0342  Weight: 70.4 kg   Physical Exam Vitals reviewed.  Constitutional:      General: She is not in acute distress.    Appearance: She is not ill-appearing or toxic-appearing.  Cardiovascular:  Rate and Rhythm: Normal rate and regular rhythm.     Heart sounds: No murmur heard. Pulmonary:     Effort: Pulmonary effort is normal. No respiratory distress.     Breath sounds: No wheezing, rhonchi or rales.  Neurological:     Mental Status: She is alert.  Psychiatric:        Mood and Affect: Mood normal.        Behavior: Behavior normal.      Condition at discharge: good  The results of significant diagnostics from this hospitalization (including imaging, microbiology, ancillary and laboratory) are listed below for reference.   Imaging Studies: DG Abd Portable 1V-Small Bowel Obstruction  Protocol-initial, 8 hr delay  Result Date: 05/21/2023 CLINICAL DATA:  Small-bowel obstruction EXAM: PORTABLE ABDOMEN - 1 VIEW COMPARISON:  05/07/2020, 05/21/2023 FINDINGS: 2 supine frontal views of the abdomen and pelvis are obtained 8 hours after oral contrast administration. Oral contrast has progressed into the colon, excluding high-grade obstruction. There is persistent gaseous distension of small bowel loops within the left lower quadrant measuring up to 4 cm. No abdominal masses. No abnormal calcifications. Lung bases are clear. IMPRESSION: 1. Passage of oral contrast into the colon at the time of imaging, with no evidence of high-grade small bowel obstruction. 2. Persistent gaseous distension of small bowel loops in the left lower quadrant, which could reflect an intermittent or resolving small-bowel obstruction versus localized ileus. Electronically Signed   By: Sharlet Salina M.D.   On: 05/21/2023 18:34   CT ABDOMEN PELVIS W CONTRAST  Result Date: 05/21/2023 CLINICAL DATA:  Acute abdominal pain EXAM: CT ABDOMEN AND PELVIS WITH CONTRAST TECHNIQUE: Multidetector CT imaging of the abdomen and pelvis was performed using the standard protocol following bolus administration of intravenous contrast. RADIATION DOSE REDUCTION: This exam was performed according to the departmental dose-optimization program which includes automated exposure control, adjustment of the mA and/or kV according to patient size and/or use of iterative reconstruction technique. CONTRAST:  75mL OMNIPAQUE IOHEXOL 300 MG/ML  SOLN COMPARISON:  None Available. FINDINGS: Lower Chest: Normal. Hepatobiliary: Scattered hypodense foci, too small to characterize accurately, but most likely hepatic cysts. Otherwise normal liver. No biliary dilatation. Normal gallbladder. Pancreas: Normal pancreas. No ductal dilatation or peripancreatic fluid collection. Spleen: Normal. Adrenals/Urinary Tract: The adrenal glands are normal. No hydronephrosis,  nephroureterolithiasis or solid renal mass. The urinary bladder is normal for degree of distention Stomach/Bowel: There are mildly dilated fluid-filled small bowel loops throughout the left half of the abdomen. This includes a loop with fecalized contents pelvis, which is the area of transition. There is postsurgical change at the rectum, which is near the obstructed bowel segment Vascular/Lymphatic: There is calcific atherosclerosis of the abdominal aorta. No lymphadenopathy. Reproductive: Normal uterus. No adnexal mass. Other: None. Musculoskeletal: No bony spinal canal stenosis or focal osseous abnormality. IMPRESSION: 1. Mildly dilated fluid-filled small bowel loops throughout the left half of the abdomen, with area of transition in the pelvis, consistent with small bowel obstruction. 2. Postsurgical change at the rectum, which is near the obstructed bowel segment. Aortic Atherosclerosis (ICD10-I70.0). Electronically Signed   By: Deatra Robinson M.D.   On: 05/21/2023 01:44    Microbiology: Results for orders placed or performed during the hospital encounter of 05/21/23  Urine Culture (for pregnant, neutropenic or urologic patients or patients with an indwelling urinary catheter)     Status: Abnormal (Preliminary result)   Collection Time: 05/21/23  6:58 AM   Specimen: Urine, Clean Catch  Result Value Ref Range  Status   Specimen Description   Final    URINE, CLEAN CATCH Performed at [facility], [address] [place], [place] [address]    Special Requests   Final    NONE Performed at [facility], [address], [place], [place] [address]    Culture (A)  Final    10,000 COLONIES/mL DIPHTHEROIDS(CORYNEBACTERIUM SPECIES) Standardized susceptibility testing for this organism is not available. Performed at [facility], [address] [place], [place] [address]    Report Status PENDING  Incomplete    Labs: CBC: Recent Labs  Lab 05/21/23 0042  05/21/23 0403  WBC 12.3* 10.7*  HGB 13.5 12.4  HCT 39.8 38.7  MCV 96.1 100.3*  PLT 298 275   Basic Metabolic Panel: Recent Labs  Lab 05/21/23 0042 05/21/23 0403  NA 136 138  K 3.6 3.7  CL 100 104  CO2 25 26  GLUCOSE 149* 125*  BUN 12 12  CREATININE 1.04* 0.92  CALCIUM 11.1* 9.9   Liver Function Tests: Recent Labs  Lab 05/21/23 0042  AST 18  ALT 7  ALKPHOS 38  BILITOT 0.7  PROT 7.3  ALBUMIN 4.3   CBG: No results for input(s): "GLUCAP" in the last 168 hours.  Discharge time spent: less than 30 minutes.  Signed: Brendia Sacks, MD [facility] 05/22/2023

## 2023-05-22 NOTE — Plan of Care (Signed)
  Problem: Education: Goal: Knowledge of General Education information will improve Description: Including pain rating scale, medication(s)/side effects and non-pharmacologic comfort measures 05/22/2023 0031 by Hortencia Pilar, RN Outcome: Progressing 05/22/2023 0031 by Hortencia Pilar, RN Outcome: Progressing   Problem: Health Behavior/Discharge Planning: Goal: Ability to manage health-related needs will improve 05/22/2023 0031 by Hortencia Pilar, RN Outcome: Progressing 05/22/2023 0031 by Hortencia Pilar, RN Outcome: Progressing   Problem: Clinical Measurements: Goal: Ability to maintain clinical measurements within normal limits will improve 05/22/2023 0031 by Hortencia Pilar, RN Outcome: Progressing 05/22/2023 0031 by Hortencia Pilar, RN Outcome: Progressing Goal: Will remain free from infection 05/22/2023 0031 by Hortencia Pilar, RN Outcome: Progressing 05/22/2023 0031 by Hortencia Pilar, RN Outcome: Progressing Goal: Diagnostic test results will improve 05/22/2023 0031 by Hortencia Pilar, RN Outcome: Progressing 05/22/2023 0031 by Hortencia Pilar, RN Outcome: Progressing Goal: Respiratory complications will improve 05/22/2023 0031 by Hortencia Pilar, RN Outcome: Progressing 05/22/2023 0031 by Hortencia Pilar, RN Outcome: Progressing Goal: Cardiovascular complication will be avoided 05/22/2023 0031 by Hortencia Pilar, RN Outcome: Progressing 05/22/2023 0031 by Hortencia Pilar, RN Outcome: Progressing   Problem: Activity: Goal: Risk for activity intolerance will decrease 05/22/2023 0031 by Hortencia Pilar, RN Outcome: Progressing 05/22/2023 0031 by Hortencia Pilar, RN Outcome: Progressing   Problem: Nutrition: Goal: Adequate nutrition will be maintained 05/22/2023 0031 by Hortencia Pilar, RN Outcome: Progressing 05/22/2023 0031 by Hortencia Pilar, RN Outcome: Progressing   Problem: Coping: Goal: Level of anxiety will decrease 05/22/2023 0031 by Hortencia Pilar, RN Outcome: Progressing 05/22/2023 0031 by Hortencia Pilar, RN Outcome: Progressing   Problem: Elimination: Goal: Will not experience complications related to bowel motility 05/22/2023 0031 by Hortencia Pilar, RN Outcome: Progressing 05/22/2023 0031 by Hortencia Pilar, RN Outcome: Progressing Goal: Will not experience complications related to urinary retention 05/22/2023 0031 by Hortencia Pilar, RN Outcome: Progressing 05/22/2023 0031 by Hortencia Pilar, RN Outcome: Progressing   Problem: Pain Managment: Goal: General experience of comfort will improve 05/22/2023 0031 by Hortencia Pilar, RN Outcome: Progressing 05/22/2023 0031 by Hortencia Pilar, RN Outcome: Progressing   Problem: Safety: Goal: Ability to remain free from injury will improve 05/22/2023 0031 by Hortencia Pilar, RN Outcome: Progressing 05/22/2023 0031 by Hortencia Pilar, RN Outcome: Progressing   Problem: Skin Integrity: Goal: Risk for impaired skin integrity will decrease 05/22/2023 0031 by Hortencia Pilar, RN Outcome: Progressing 05/22/2023 0031 by Hortencia Pilar, RN Outcome: Progressing

## 2023-05-22 NOTE — Progress Notes (Signed)
Subjective/Chief Complaint: Had multiple BM's yesterday No abdominal pain this morning   Objective: Vital signs in last 24 hours: Temp:  [97.9 F (36.6 C)-98.1 F (36.7 C)] 97.9 F (36.6 C) (06/22 0607) Pulse Rate:  [74-79] 74 (06/22 0607) Resp:  [18] 18 (06/22 0607) BP: (93-104)/(63-66) 104/63 (06/22 0607) SpO2:  [97 %-100 %] 97 % (06/22 0607) Last BM Date : 05/20/23  Intake/Output from previous day: 06/21 0701 - 06/22 0700 In: 360 [P.O.:360] Out: -  Intake/Output this shift: No intake/output data recorded.  Exam: Awake, looks comfortable Abdomen with slight tenderness, non-distended  Lab Results:  Recent Labs    05/21/23 0042 05/21/23 0403  WBC 12.3* 10.7*  HGB 13.5 12.4  HCT 39.8 38.7  PLT 298 275   BMET Recent Labs    05/21/23 0042 05/21/23 0403  NA 136 138  K 3.6 3.7  CL 100 104  CO2 25 26  GLUCOSE 149* 125*  BUN 12 12  CREATININE 1.04* 0.92  CALCIUM 11.1* 9.9   PT/INR No results for input(s): "LABPROT", "INR" in the last 72 hours. ABG No results for input(s): "PHART", "HCO3" in the last 72 hours.  Invalid input(s): "PCO2", "PO2"  Studies/Results: DG Abd Portable 1V-Small Bowel Obstruction Protocol-initial, 8 hr delay  Result Date: 05/21/2023 CLINICAL DATA:  Small-bowel obstruction EXAM: PORTABLE ABDOMEN - 1 VIEW COMPARISON:  05/07/2020, 05/21/2023 FINDINGS: 2 supine frontal views of the abdomen and pelvis are obtained 8 hours after oral contrast administration. Oral contrast has progressed into the colon, excluding high-grade obstruction. There is persistent gaseous distension of small bowel loops within the left lower quadrant measuring up to 4 cm. No abdominal masses. No abnormal calcifications. Lung bases are clear. IMPRESSION: 1. Passage of oral contrast into the colon at the time of imaging, with no evidence of high-grade small bowel obstruction. 2. Persistent gaseous distension of small bowel loops in the left lower quadrant, which could  reflect an intermittent or resolving small-bowel obstruction versus localized ileus. Electronically Signed   By: Sharlet Salina M.D.   On: 05/21/2023 18:34   CT ABDOMEN PELVIS W CONTRAST  Result Date: 05/21/2023 CLINICAL DATA:  Acute abdominal pain EXAM: CT ABDOMEN AND PELVIS WITH CONTRAST TECHNIQUE: Multidetector CT imaging of the abdomen and pelvis was performed using the standard protocol following bolus administration of intravenous contrast. RADIATION DOSE REDUCTION: This exam was performed according to the departmental dose-optimization program which includes automated exposure control, adjustment of the mA and/or kV according to patient size and/or use of iterative reconstruction technique. CONTRAST:  75mL OMNIPAQUE IOHEXOL 300 MG/ML  SOLN COMPARISON:  None Available. FINDINGS: Lower Chest: Normal. Hepatobiliary: Scattered hypodense foci, too small to characterize accurately, but most likely hepatic cysts. Otherwise normal liver. No biliary dilatation. Normal gallbladder. Pancreas: Normal pancreas. No ductal dilatation or peripancreatic fluid collection. Spleen: Normal. Adrenals/Urinary Tract: The adrenal glands are normal. No hydronephrosis, nephroureterolithiasis or solid renal mass. The urinary bladder is normal for degree of distention Stomach/Bowel: There are mildly dilated fluid-filled small bowel loops throughout the left half of the abdomen. This includes a loop with fecalized contents pelvis, which is the area of transition. There is postsurgical change at the rectum, which is near the obstructed bowel segment Vascular/Lymphatic: There is calcific atherosclerosis of the abdominal aorta. No lymphadenopathy. Reproductive: Normal uterus. No adnexal mass. Other: None. Musculoskeletal: No bony spinal canal stenosis or focal osseous abnormality. IMPRESSION: 1. Mildly dilated fluid-filled small bowel loops throughout the left half of the abdomen, with area of transition  in the pelvis, consistent with  small bowel obstruction. 2. Postsurgical change at the rectum, which is near the obstructed bowel segment. Aortic Atherosclerosis (ICD10-I70.0). Electronically Signed   By: Deatra Robinson M.D.   On: 05/21/2023 01:44    Anti-infectives: Anti-infectives (From admission, onward)    Start     Dose/Rate Route Frequency Ordered Stop   05/21/23 0230  cefTRIAXone (ROCEPHIN) 1 g in sodium chloride 0.9 % 100 mL IVPB        1 g 200 mL/hr over 30 Minutes Intravenous  Once 05/21/23 0215 05/21/23 0316       Assessment/Plan: Partial SBO  Protocol films show contrast throughout the colon with minimal dilation of small bowel in the LLQ  Will advance to full liquids. If she tolerates fulls at breakfast and lunch, ok from a surgical standpoint to discharge home.  Would recommend a softer, lighter diet for the next few days if discharged  LOS: 0 days    Abigail Miyamoto 05/22/2023

## 2023-05-25 ENCOUNTER — Telehealth: Payer: Self-pay | Admitting: Neurology

## 2023-05-25 NOTE — Telephone Encounter (Signed)
Called patient back and her stomach is hurting and she is asking if patient has called PCP or if she has received a referral to the gastrointestinal Dr for further instructions on what to do for her stomach pain and constipation. I sent patient the [place] recipe and then her last question was does parkinson's make your teeth hurt which I told her no it does not. Patient angry that Dr. Arbutus Leas did not call her back personally and she felt I was not a nurse and did not know the answers to her questions. I again told patient we are a neurology office and I would recommend for stomach pain and obstruction of the bowels to see her PCP or a specialized dr like a gastrointestinal dr

## 2023-05-25 NOTE — Telephone Encounter (Signed)
Patient was in the hospital on Friday and Saturday, she was at [facility]. She would like to speak to someone about what is going on with her. She think this could have something to do from her Parkinson

## 2023-05-25 NOTE — Telephone Encounter (Signed)
Patient was in hospital for a small bowel obstruction and it was cleared and she was sent home

## 2023-05-26 ENCOUNTER — Other Ambulatory Visit (HOSPITAL_BASED_OUTPATIENT_CLINIC_OR_DEPARTMENT_OTHER): Payer: Self-pay

## 2023-05-31 DIAGNOSIS — F4323 Adjustment disorder with mixed anxiety and depressed mood: Secondary | ICD-10-CM | POA: Diagnosis not present

## 2023-06-02 ENCOUNTER — Other Ambulatory Visit (HOSPITAL_BASED_OUTPATIENT_CLINIC_OR_DEPARTMENT_OTHER): Payer: Self-pay

## 2023-06-02 ENCOUNTER — Telehealth: Payer: Self-pay | Admitting: Neurology

## 2023-06-02 MED ORDER — FLUOXETINE HCL 20 MG PO CAPS
20.0000 mg | ORAL_CAPSULE | Freq: Every day | ORAL | 0 refills | Status: DC
  Filled 2023-06-02: qty 90, 90d supply, fill #0

## 2023-06-02 NOTE — Telephone Encounter (Signed)
Pt is calling in to see if Dr. Arbutus Leas would give an recommendation for a GI provider that knows something about parkinson's.  Pt would like to have a call back pt is aware that the provider has left for today.

## 2023-06-04 ENCOUNTER — Other Ambulatory Visit (HOSPITAL_BASED_OUTPATIENT_CLINIC_OR_DEPARTMENT_OTHER): Payer: Self-pay

## 2023-06-04 NOTE — Telephone Encounter (Signed)
Called patient suggested [facility] and helped patient find phone number and address

## 2023-06-07 DIAGNOSIS — R1013 Epigastric pain: Secondary | ICD-10-CM | POA: Diagnosis not present

## 2023-06-08 ENCOUNTER — Other Ambulatory Visit (HOSPITAL_BASED_OUTPATIENT_CLINIC_OR_DEPARTMENT_OTHER): Payer: Self-pay

## 2023-06-08 ENCOUNTER — Other Ambulatory Visit: Payer: Self-pay | Admitting: Gastroenterology

## 2023-06-08 DIAGNOSIS — R1013 Epigastric pain: Secondary | ICD-10-CM | POA: Diagnosis not present

## 2023-06-08 DIAGNOSIS — Z09 Encounter for follow-up examination after completed treatment for conditions other than malignant neoplasm: Secondary | ICD-10-CM | POA: Diagnosis not present

## 2023-06-08 DIAGNOSIS — Z8719 Personal history of other diseases of the digestive system: Secondary | ICD-10-CM | POA: Diagnosis not present

## 2023-06-08 DIAGNOSIS — R1084 Generalized abdominal pain: Secondary | ICD-10-CM | POA: Diagnosis not present

## 2023-06-08 MED ORDER — SUCRALFATE 1 G PO TABS
1.0000 g | ORAL_TABLET | Freq: Two times a day (BID) | ORAL | 0 refills | Status: DC
  Filled 2023-06-08: qty 60, 30d supply, fill #0

## 2023-06-18 ENCOUNTER — Other Ambulatory Visit (HOSPITAL_BASED_OUTPATIENT_CLINIC_OR_DEPARTMENT_OTHER): Payer: Self-pay

## 2023-06-18 DIAGNOSIS — H25813 Combined forms of age-related cataract, bilateral: Secondary | ICD-10-CM | POA: Diagnosis not present

## 2023-06-18 DIAGNOSIS — H04123 Dry eye syndrome of bilateral lacrimal glands: Secondary | ICD-10-CM | POA: Diagnosis not present

## 2023-06-18 DIAGNOSIS — H35363 Drusen (degenerative) of macula, bilateral: Secondary | ICD-10-CM | POA: Diagnosis not present

## 2023-06-18 DIAGNOSIS — H40013 Open angle with borderline findings, low risk, bilateral: Secondary | ICD-10-CM | POA: Diagnosis not present

## 2023-06-21 DIAGNOSIS — F4323 Adjustment disorder with mixed anxiety and depressed mood: Secondary | ICD-10-CM | POA: Diagnosis not present

## 2023-06-22 ENCOUNTER — Other Ambulatory Visit: Payer: Medicare PPO

## 2023-06-24 ENCOUNTER — Ambulatory Visit
Admission: RE | Admit: 2023-06-24 | Discharge: 2023-06-24 | Disposition: A | Discharge status: Home / Self Care | Payer: Medicare PPO | Source: Ambulatory Visit | Attending: Gastroenterology | Admitting: Gastroenterology

## 2023-06-24 ENCOUNTER — Encounter (HOSPITAL_BASED_OUTPATIENT_CLINIC_OR_DEPARTMENT_OTHER): Payer: Self-pay | Admitting: Cardiology

## 2023-06-24 DIAGNOSIS — K449 Diaphragmatic hernia without obstruction or gangrene: Secondary | ICD-10-CM | POA: Diagnosis not present

## 2023-06-24 DIAGNOSIS — Z8719 Personal history of other diseases of the digestive system: Secondary | ICD-10-CM

## 2023-06-29 DIAGNOSIS — F310 Bipolar disorder, current episode hypomanic: Secondary | ICD-10-CM | POA: Diagnosis not present

## 2023-07-05 DIAGNOSIS — R350 Frequency of micturition: Secondary | ICD-10-CM | POA: Diagnosis not present

## 2023-07-08 DIAGNOSIS — Z8719 Personal history of other diseases of the digestive system: Secondary | ICD-10-CM | POA: Diagnosis not present

## 2023-07-08 DIAGNOSIS — K56600 Partial intestinal obstruction, unspecified as to cause: Secondary | ICD-10-CM | POA: Diagnosis not present

## 2023-07-14 DIAGNOSIS — F411 Generalized anxiety disorder: Secondary | ICD-10-CM | POA: Diagnosis not present

## 2023-07-16 ENCOUNTER — Other Ambulatory Visit: Payer: Self-pay | Admitting: Neurology

## 2023-07-16 ENCOUNTER — Other Ambulatory Visit (HOSPITAL_BASED_OUTPATIENT_CLINIC_OR_DEPARTMENT_OTHER): Payer: Self-pay

## 2023-07-16 DIAGNOSIS — G20A1 Parkinson's disease without dyskinesia, without mention of fluctuations: Secondary | ICD-10-CM

## 2023-07-16 MED ORDER — CARBIDOPA-LEVODOPA 25-100 MG PO TABS
1.5000 | ORAL_TABLET | Freq: Three times a day (TID) | ORAL | 0 refills | Status: DC
  Filled 2023-07-16: qty 403, 90d supply, fill #0

## 2023-07-20 ENCOUNTER — Other Ambulatory Visit (HOSPITAL_BASED_OUTPATIENT_CLINIC_OR_DEPARTMENT_OTHER): Payer: Self-pay

## 2023-07-20 DIAGNOSIS — R1084 Generalized abdominal pain: Secondary | ICD-10-CM | POA: Diagnosis not present

## 2023-07-20 DIAGNOSIS — R140 Abdominal distension (gaseous): Secondary | ICD-10-CM | POA: Diagnosis not present

## 2023-07-20 MED ORDER — HYOSCYAMINE SULFATE 0.125 MG PO TBDP
0.1250 mg | ORAL_TABLET | Freq: Four times a day (QID) | ORAL | 1 refills | Status: DC | PRN
  Filled 2023-07-20: qty 30, 8d supply, fill #0
  Filled 2023-08-28: qty 30, 8d supply, fill #1

## 2023-07-26 DIAGNOSIS — Z8719 Personal history of other diseases of the digestive system: Secondary | ICD-10-CM | POA: Diagnosis not present

## 2023-07-26 DIAGNOSIS — K862 Cyst of pancreas: Secondary | ICD-10-CM | POA: Diagnosis not present

## 2023-07-26 DIAGNOSIS — R932 Abnormal findings on diagnostic imaging of liver and biliary tract: Secondary | ICD-10-CM | POA: Diagnosis not present

## 2023-07-26 DIAGNOSIS — K56600 Partial intestinal obstruction, unspecified as to cause: Secondary | ICD-10-CM | POA: Diagnosis not present

## 2023-08-05 DIAGNOSIS — F4323 Adjustment disorder with mixed anxiety and depressed mood: Secondary | ICD-10-CM | POA: Diagnosis not present

## 2023-08-15 ENCOUNTER — Other Ambulatory Visit (HOSPITAL_BASED_OUTPATIENT_CLINIC_OR_DEPARTMENT_OTHER): Payer: Self-pay

## 2023-08-15 DIAGNOSIS — M79671 Pain in right foot: Secondary | ICD-10-CM | POA: Diagnosis not present

## 2023-08-15 MED ORDER — PREDNISONE 20 MG PO TABS
40.0000 mg | ORAL_TABLET | Freq: Every day | ORAL | 0 refills | Status: DC
  Filled 2023-08-15: qty 10, 5d supply, fill #0

## 2023-08-16 ENCOUNTER — Other Ambulatory Visit (HOSPITAL_BASED_OUTPATIENT_CLINIC_OR_DEPARTMENT_OTHER): Payer: Self-pay

## 2023-08-18 DIAGNOSIS — I700 Atherosclerosis of aorta: Secondary | ICD-10-CM | POA: Diagnosis not present

## 2023-08-18 DIAGNOSIS — K869 Disease of pancreas, unspecified: Secondary | ICD-10-CM | POA: Diagnosis not present

## 2023-08-18 DIAGNOSIS — R188 Other ascites: Secondary | ICD-10-CM | POA: Diagnosis not present

## 2023-08-18 DIAGNOSIS — K862 Cyst of pancreas: Secondary | ICD-10-CM | POA: Diagnosis not present

## 2023-08-19 DIAGNOSIS — K638219 Small intestinal bacterial overgrowth, unspecified: Secondary | ICD-10-CM | POA: Diagnosis not present

## 2023-08-19 DIAGNOSIS — R1084 Generalized abdominal pain: Secondary | ICD-10-CM | POA: Diagnosis not present

## 2023-08-19 DIAGNOSIS — R140 Abdominal distension (gaseous): Secondary | ICD-10-CM | POA: Diagnosis not present

## 2023-08-19 DIAGNOSIS — K63829 Intestinal methanogen overgrowth, unspecified: Secondary | ICD-10-CM | POA: Diagnosis not present

## 2023-08-20 ENCOUNTER — Ambulatory Visit: Payer: Medicare PPO | Admitting: Neurology

## 2023-08-24 DIAGNOSIS — F411 Generalized anxiety disorder: Secondary | ICD-10-CM | POA: Diagnosis not present

## 2023-08-25 DIAGNOSIS — R7309 Other abnormal glucose: Secondary | ICD-10-CM | POA: Diagnosis not present

## 2023-08-25 DIAGNOSIS — R7303 Prediabetes: Secondary | ICD-10-CM | POA: Diagnosis not present

## 2023-08-26 ENCOUNTER — Other Ambulatory Visit (HOSPITAL_BASED_OUTPATIENT_CLINIC_OR_DEPARTMENT_OTHER): Payer: Self-pay

## 2023-08-26 MED ORDER — XIFAXAN 550 MG PO TABS
550.0000 mg | ORAL_TABLET | Freq: Three times a day (TID) | ORAL | 0 refills | Status: AC
  Filled 2023-08-26 – 2023-08-30 (×2): qty 42, 14d supply, fill #0

## 2023-08-27 ENCOUNTER — Other Ambulatory Visit (HOSPITAL_BASED_OUTPATIENT_CLINIC_OR_DEPARTMENT_OTHER): Payer: Self-pay

## 2023-08-30 ENCOUNTER — Other Ambulatory Visit (HOSPITAL_BASED_OUTPATIENT_CLINIC_OR_DEPARTMENT_OTHER): Payer: Self-pay

## 2023-09-01 DIAGNOSIS — E7849 Other hyperlipidemia: Secondary | ICD-10-CM | POA: Diagnosis not present

## 2023-09-01 DIAGNOSIS — N1831 Chronic kidney disease, stage 3a: Secondary | ICD-10-CM | POA: Diagnosis not present

## 2023-09-01 DIAGNOSIS — I25119 Atherosclerotic heart disease of native coronary artery with unspecified angina pectoris: Secondary | ICD-10-CM | POA: Diagnosis not present

## 2023-09-01 DIAGNOSIS — R7303 Prediabetes: Secondary | ICD-10-CM | POA: Diagnosis not present

## 2023-09-01 DIAGNOSIS — G20A1 Parkinson's disease without dyskinesia, without mention of fluctuations: Secondary | ICD-10-CM | POA: Diagnosis not present

## 2023-09-01 DIAGNOSIS — K638219 Small intestinal bacterial overgrowth, unspecified: Secondary | ICD-10-CM | POA: Diagnosis not present

## 2023-09-01 DIAGNOSIS — F325 Major depressive disorder, single episode, in full remission: Secondary | ICD-10-CM | POA: Diagnosis not present

## 2023-09-01 DIAGNOSIS — M810 Age-related osteoporosis without current pathological fracture: Secondary | ICD-10-CM | POA: Diagnosis not present

## 2023-09-01 DIAGNOSIS — M1990 Unspecified osteoarthritis, unspecified site: Secondary | ICD-10-CM | POA: Diagnosis not present

## 2023-09-08 ENCOUNTER — Other Ambulatory Visit (HOSPITAL_BASED_OUTPATIENT_CLINIC_OR_DEPARTMENT_OTHER): Payer: Self-pay

## 2023-09-08 ENCOUNTER — Other Ambulatory Visit: Payer: Self-pay

## 2023-09-08 MED ORDER — FLUOXETINE HCL 20 MG PO CAPS
20.0000 mg | ORAL_CAPSULE | Freq: Every morning | ORAL | 1 refills | Status: DC
  Filled 2023-09-08: qty 90, 90d supply, fill #0
  Filled 2023-12-06: qty 90, 90d supply, fill #1

## 2023-09-10 ENCOUNTER — Other Ambulatory Visit (HOSPITAL_BASED_OUTPATIENT_CLINIC_OR_DEPARTMENT_OTHER): Payer: Self-pay

## 2023-09-21 DIAGNOSIS — F4323 Adjustment disorder with mixed anxiety and depressed mood: Secondary | ICD-10-CM | POA: Diagnosis not present

## 2023-09-22 ENCOUNTER — Other Ambulatory Visit (HOSPITAL_BASED_OUTPATIENT_CLINIC_OR_DEPARTMENT_OTHER): Payer: Self-pay

## 2023-09-22 MED ORDER — DICYCLOMINE HCL 10 MG PO CAPS
ORAL_CAPSULE | ORAL | 2 refills | Status: DC
  Filled 2023-09-22: qty 90, 30d supply, fill #0

## 2023-09-23 ENCOUNTER — Other Ambulatory Visit (HOSPITAL_BASED_OUTPATIENT_CLINIC_OR_DEPARTMENT_OTHER): Payer: Self-pay

## 2023-09-23 MED ORDER — METRONIDAZOLE 500 MG PO TABS
500.0000 mg | ORAL_TABLET | Freq: Three times a day (TID) | ORAL | 0 refills | Status: DC
  Filled 2023-09-23: qty 30, 10d supply, fill #0

## 2023-10-08 ENCOUNTER — Other Ambulatory Visit (HOSPITAL_BASED_OUTPATIENT_CLINIC_OR_DEPARTMENT_OTHER): Payer: Self-pay

## 2023-10-08 NOTE — Progress Notes (Unsigned)
Assessment/Plan:   1.  Parkinsons Disease, diagnosed March, 2023  -*** carbidopa/levodopa 25/100, 1.5 tablet 3 times per day.   -We discussed that it used to be thought that levodopa would increase risk of melanoma but now it is believed that Parkinsons itself likely increases risk of melanoma. she is to get regular skin checks.  2.  Low BP  -need to watch this closely  -asymptommatic  -increase hydration  3.  ?burning tongue syndrome  -she uses biotene  -discussed gabapentin 100 mg q hs x 1 week then bid  4.  GAD  -On Prozac and BuSpar  -Primary care managing  -Not optimally controlled.  -May need to consider psychiatry and/or counseling.   5.  History of small bowel obstruction  -Likely due to multiple prior abdominal surgeries  -Was nonsurgical in nature.  -Following with gastroenterology at [facility] in [place]  6.  SIBO  -Treatment by gastroenterology Subjective:   Norton Blizzard was seen today in follow up for Parkinsons disease, diagnosed last visit.  My previous records were reviewed prior to todays visit as well as outside records available to me. Daughter with patient and supplements hx. last visit, I slightly increased her levodopa.  We started her on gabapentin for possible burning tongue.  She was very anxious and discussed that she may need to consider psychiatry.  She was in the hospital in June for small bowel obstruction.  This did not require surgical intervention.  The patient called a few days after she was out of the hospital stating that her stomach was hurting and thought perhaps it had something to do with the Parkinson's disease, and felt that the small bowel obstruction had something to do with the Parkinson's as well.  We called back and stated that while the constipation often is associate with Parkinson's, small bowel obstruction is likely due to her multiple prior surgeries to her abdomen.  We told her to follow-up with her surgeon that she saw in  the hospital.  She ended up going to gastroenterology at [facility] in [place] after apparently being seen at [facility] as well (I do not have those notes).  Testing was done, consistent with SIBO  Current prescribed movement disorder medications: Carbidopa/levodopa 25/100, 1.5 tablets 3 times per day (increased) Gabapentin 100 mg twice daily (started last visit)   ALLERGIES:   Allergies  Allergen Reactions   Statins Other (See Comments)    Myalgias   Chocolate     Stated by patient from blood work    CURRENT MEDICATIONS:  No outpatient medications have been marked as taking for the 10/12/23 encounter (Appointment) with Kloi Brodman, Octaviano Batty, DO.     Objective:   PHYSICAL EXAMINATION:    VITALS:   There were no vitals filed for this visit.    GEN:  The patient appears stated age and is in NAD. HEENT:  Normocephalic, atraumatic.  The mucous membranes are moist. The superficial temporal arteries are without ropiness or tenderness.   Neurological examination:  Orientation: The patient is alert and oriented x3. Cranial nerves: There is good facial symmetry without facial hypomimia. The speech is fluent and clear. Soft palate rises symmetrically and there is no tongue deviation. Hearing is intact to conversational tone. Sensation: Sensation is intact to light touch throughout Motor: Strength is at least antigravity x4.  Movement examination: Tone: There is mild increased tone in the RUE>LUE Abnormal movements: min tremor in the L arm Coordination:  There is no decremation, with  any form of RAMS, including alternating supination and pronation of the forearm, hand opening and closing, finger taps, heel taps and toe taps. Gait and Station: The patient has no difficulty arising out of a deep-seated chair without the use of the hands. The patient's stride length is good but she has decreased arm swing on the R  I have reviewed and interpreted the following labs independently    Chemistry       Component Value Date/Time   NA 138 05/21/2023 0403   NA 142 11/10/2018 0952   K 3.7 05/21/2023 0403   CL 104 05/21/2023 0403   CO2 26 05/21/2023 0403   BUN 12 05/21/2023 0403   BUN 14 11/10/2018 0952   CREATININE 0.92 05/21/2023 0403      Component Value Date/Time   CALCIUM 9.9 05/21/2023 0403   ALKPHOS 38 05/21/2023 0042   AST 18 05/21/2023 0042   ALT 7 05/21/2023 0042   BILITOT 0.7 05/21/2023 0042       Lab Results  Component Value Date   WBC 10.7 (H) 05/21/2023   HGB 12.4 05/21/2023   HCT 38.7 05/21/2023   MCV 100.3 (H) 05/21/2023   PLT 275 05/21/2023    Lab Results  Component Value Date   TSH 1.10 08/18/2022     Total time spent on today's visit was *** minutes, including both face-to-face time and nonface-to-face time.  Time included that spent on review of records (prior notes available to me/labs/imaging if pertinent), discussing treatment and goals, answering patient's questions and coordinating care.  Cc:  Gweneth Dimitri, MD

## 2023-10-10 ENCOUNTER — Other Ambulatory Visit: Payer: Self-pay | Admitting: Neurology

## 2023-10-10 DIAGNOSIS — G20A1 Parkinson's disease without dyskinesia, without mention of fluctuations: Secondary | ICD-10-CM

## 2023-10-11 ENCOUNTER — Other Ambulatory Visit (HOSPITAL_BASED_OUTPATIENT_CLINIC_OR_DEPARTMENT_OTHER): Payer: Self-pay

## 2023-10-12 ENCOUNTER — Other Ambulatory Visit (HOSPITAL_BASED_OUTPATIENT_CLINIC_OR_DEPARTMENT_OTHER): Payer: Self-pay

## 2023-10-12 ENCOUNTER — Encounter: Payer: Self-pay | Admitting: Neurology

## 2023-10-12 ENCOUNTER — Ambulatory Visit: Payer: Medicare PPO | Admitting: Neurology

## 2023-10-12 VITALS — BP 110/60 | HR 91 | Ht 64.0 in | Wt 160.6 lb

## 2023-10-12 DIAGNOSIS — G20B1 Parkinson's disease with dyskinesia, without mention of fluctuations: Secondary | ICD-10-CM | POA: Diagnosis not present

## 2023-10-12 MED ORDER — CARBIDOPA-LEVODOPA 25-100 MG PO TABS
1.5000 | ORAL_TABLET | Freq: Three times a day (TID) | ORAL | 0 refills | Status: DC
Start: 2023-10-12 — End: 2024-01-09
  Filled 2023-10-12: qty 403, 90d supply, fill #0

## 2023-10-12 NOTE — Patient Instructions (Signed)
 Here are some resources/books that you may find helpful as you navigate the challenges of Parkinson's Disease  1.  Parkinson's treatement: 10 secrets to a happier life by Jefferey Pica, MD 2.  Navigating Life with Parkinsons disease by Sotirios Parashos 3.  My degeneration: A journey through Parkinsons Ledora Bottcher - Shohl) 4.  Every Victory counts (I believe this one if free through [employer]) 5.  Lucky Man by Gardner Candle 6.  101 Questions & Answers about Parkinson's by Caprice Renshaw 7.  Parkinsons Disease Treatment Book by JE Ahiskog  The physicians and staff at [facility] are committed to providing excellent care. You may receive a survey requesting feedback about your experience at our office. We strive to receive "very good" responses to the survey questions. If you feel that your experience would prevent you from giving the office a "very good " response, please contact our office to try to remedy the situation. We may be reached at [phone]. Thank you for taking the time out of your busy day to complete the survey.

## 2023-10-13 ENCOUNTER — Other Ambulatory Visit (HOSPITAL_BASED_OUTPATIENT_CLINIC_OR_DEPARTMENT_OTHER): Payer: Self-pay

## 2023-10-13 MED ORDER — HYOSCYAMINE SULFATE 0.125 MG PO TBDP
0.1250 mg | ORAL_TABLET | Freq: Four times a day (QID) | ORAL | 1 refills | Status: DC | PRN
  Filled 2023-10-13: qty 30, 8d supply, fill #0

## 2023-10-14 DIAGNOSIS — R1030 Lower abdominal pain, unspecified: Secondary | ICD-10-CM | POA: Diagnosis not present

## 2023-10-14 DIAGNOSIS — R109 Unspecified abdominal pain: Secondary | ICD-10-CM | POA: Diagnosis not present

## 2023-11-16 ENCOUNTER — Ambulatory Visit (HOSPITAL_BASED_OUTPATIENT_CLINIC_OR_DEPARTMENT_OTHER): Payer: Medicare PPO | Admitting: Cardiology

## 2023-11-30 ENCOUNTER — Other Ambulatory Visit: Payer: Self-pay

## 2023-12-07 ENCOUNTER — Other Ambulatory Visit (HOSPITAL_BASED_OUTPATIENT_CLINIC_OR_DEPARTMENT_OTHER): Payer: Self-pay

## 2023-12-07 MED ORDER — FLUAD 0.5 ML IM SUSY
PREFILLED_SYRINGE | INTRAMUSCULAR | 0 refills | Status: DC
  Filled 2023-12-07: qty 0.5, 1d supply, fill #0

## 2023-12-22 ENCOUNTER — Other Ambulatory Visit (HOSPITAL_BASED_OUTPATIENT_CLINIC_OR_DEPARTMENT_OTHER): Payer: Self-pay

## 2023-12-24 ENCOUNTER — Encounter (HOSPITAL_BASED_OUTPATIENT_CLINIC_OR_DEPARTMENT_OTHER): Payer: Self-pay

## 2023-12-29 ENCOUNTER — Ambulatory Visit (HOSPITAL_BASED_OUTPATIENT_CLINIC_OR_DEPARTMENT_OTHER): Payer: Medicare PPO | Admitting: Cardiology

## 2023-12-29 VITALS — BP 110/58 | HR 88 | Ht 64.0 in | Wt 163.4 lb

## 2023-12-29 DIAGNOSIS — E7841 Elevated Lipoprotein(a): Secondary | ICD-10-CM

## 2023-12-29 DIAGNOSIS — I2510 Atherosclerotic heart disease of native coronary artery without angina pectoris: Secondary | ICD-10-CM

## 2023-12-29 DIAGNOSIS — E7849 Other hyperlipidemia: Secondary | ICD-10-CM

## 2023-12-29 DIAGNOSIS — T466X5A Adverse effect of antihyperlipidemic and antiarteriosclerotic drugs, initial encounter: Secondary | ICD-10-CM

## 2023-12-29 DIAGNOSIS — Z7189 Other specified counseling: Secondary | ICD-10-CM

## 2023-12-29 DIAGNOSIS — M791 Myalgia, unspecified site: Secondary | ICD-10-CM | POA: Diagnosis not present

## 2023-12-29 NOTE — Patient Instructions (Signed)
Medication Instructions:  Your physician recommends that you continue on your current medications as directed. Please refer to the Current Medication list given to you today.  *If you need a refill on your cardiac medications before your next appointment, please call your pharmacy*   Lab Work: Your physician recommends that you return for FASTING lab work in one month: LIPIDS   Follow-Up: At [facility], you and your health needs are our priority.  As part of our continuing mission to provide you with exceptional heart care, we have created designated Provider Care Teams.  These Care Teams include your primary Cardiologist (physician) and Advanced Practice Providers (APPs -  Physician Assistants and Nurse Practitioners) who all work together to provide you with the care you need, when you need it.  We recommend signing up for the patient portal called "MyChart".  Sign up information is provided on this After Visit Summary.  MyChart is used to connect with patients for Virtual Visits (Telemedicine).  Patients are able to view lab/test results, encounter notes, upcoming appointments, etc.  Non-urgent messages can be sent to your provider as well.   To learn more about what you can do with MyChart, go to [email or web address].    Your next appointment:   12 month(s)  The format for your next appointment:   In Person  Provider:   Jodelle Red, MD

## 2023-12-29 NOTE — Progress Notes (Unsigned)
Cardiology Office Note:  .   Date:  12/29/2023  ID:  Alexandria Wallace, DOB 07/28/1941, MRN [id number] PCP: Gweneth Dimitri, MD   HeartCare Providers Cardiologist:  Jodelle Red, MD {  History of Present Illness: Alexandria Wallace   Alexandria Wallace is a 83 y.o. female with a hx of hyperlipidemia, Parkinson's, who is seen for follow-up. She was previously followed by Dr. Rennis Golden, last seen by him on 09/30/2021 where she was having issues with her legs. 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   Cardiovascular risk factors: Comorbid conditions: Hyperlipidemia - She believes that the Repatha had been the most successful therapy so far (stopped this at least 6 months ago; was on 420 mg once per month). Previously was on Crestor for a long time, but she was concerned about side effects of memory loss. She decided not to go through with the LP(a) clinical trial.  Family history: Her mother died at age 43 of an MI but there is no other coronary disease in the family. Her mother also had hyperlipidemia. Prior cardiac testing and/or incidental findings on other testing (ie coronary calcium): Heart Cath 10/2018, CCTA 2019  Today: Overall doing ok. Has chronic fatigue, worse in the late afternoon. She can push through it if she needs to. Sometimes she will feel better the next day after she has had to push herself.  No recent low blood pressures. No syncope.  Overall tolerating medications well. Due for lipids 01/2024.  ROS: Denies chest pain, shortness of breath at rest or with normal exertion. No PND, orthopnea, LE edema or unexpected weight gain. No syncope or palpitations. ROS otherwise negative except as noted.   Studies Reviewed: Alexandria Wallace    EKG:       Physical Exam:   VS:   BP (!) 110/58   Pulse 88   Ht 5\' 4"  (1.626 m)   Wt 163 lb 6.4 oz (74.1 kg)   SpO2 97%   BMI 28.05 kg/m    Wt Readings from Last 3 Encounters:  12/29/23 163 lb 6.4 oz (74.1 kg)  10/12/23 160 lb 9.6 oz (72.8 kg)  05/21/23 155 lb 3.3 oz (70.4 kg)    GEN: Well nourished, well developed in no acute distress HEENT: Normal, moist mucous membranes NECK: No JVD CARDIAC: regular rhythm, normal S1 and S2, no rubs or gallops. No murmur. VASCULAR: Radial and DP pulses 2+ bilaterally. No carotid bruits RESPIRATORY:  Clear to auscultation without rales, wheezing or rhonchi  ABDOMEN: Soft, non-tender, non-distended MUSCULOSKELETAL:  Ambulates independently SKIN: Warm and dry, no edema NEUROLOGIC:  Alert and oriented x 3. No focal neuro deficits noted. PSYCHIATRIC:  Normal affect    ASSESSMENT AND PLAN: .    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 This is consistent with familial hypercholesterolemia -genetic testing with VUS in ApoB, lp(a), SLC Z2A1 -statin intolerant due to myalgia -continue repatha    CV risk counseling and prevention -recommend heart healthy/Mediterranean diet, with whole grains, fruits, vegetable, fish, lean meats, nuts, and olive oil. Limit salt. -recommend moderate walking, 3-5 times/week for 30-50 minutes each session. Aim for at least 150 minutes.week. Goal should be pace of 3 miles/hours, or walking 1.5 miles in 30 minutes -recommend avoidance of  tobacco products. Avoid excess alcohol.  Dispo: ***  Signed, Jodelle Red, MD   Jodelle Red, MD, PhD, [place]  [facility]  [place]  [facility] at [facility] at [facility] [address] [place], [place] [address] [phone]

## 2024-01-01 DIAGNOSIS — S86912A Strain of unspecified muscle(s) and tendon(s) at lower leg level, left leg, initial encounter: Secondary | ICD-10-CM | POA: Diagnosis not present

## 2024-01-01 DIAGNOSIS — M25562 Pain in left knee: Secondary | ICD-10-CM | POA: Diagnosis not present

## 2024-01-09 ENCOUNTER — Other Ambulatory Visit: Payer: Self-pay | Admitting: Neurology

## 2024-01-09 ENCOUNTER — Encounter (HOSPITAL_BASED_OUTPATIENT_CLINIC_OR_DEPARTMENT_OTHER): Payer: Self-pay | Admitting: Cardiology

## 2024-01-09 DIAGNOSIS — G20A1 Parkinson's disease without dyskinesia, without mention of fluctuations: Secondary | ICD-10-CM

## 2024-01-10 ENCOUNTER — Other Ambulatory Visit (HOSPITAL_BASED_OUTPATIENT_CLINIC_OR_DEPARTMENT_OTHER): Payer: Self-pay

## 2024-01-10 MED ORDER — CARBIDOPA-LEVODOPA 25-100 MG PO TABS
1.5000 | ORAL_TABLET | Freq: Three times a day (TID) | ORAL | 0 refills | Status: DC
  Filled 2024-01-10: qty 403, 90d supply, fill #0

## 2024-01-25 DIAGNOSIS — E7849 Other hyperlipidemia: Secondary | ICD-10-CM | POA: Diagnosis not present

## 2024-01-25 DIAGNOSIS — I2510 Atherosclerotic heart disease of native coronary artery without angina pectoris: Secondary | ICD-10-CM | POA: Diagnosis not present

## 2024-01-26 LAB — LIPID PANEL
Chol/HDL Ratio: 4 {ratio} (ref 0.0–4.4)
Cholesterol, Total: 249 mg/dL — ABNORMAL HIGH (ref 100–199)
HDL: 63 mg/dL (ref >39)
LDL Chol Calc (NIH): 168 mg/dL — ABNORMAL HIGH (ref 0–99)
Triglycerides: 103 mg/dL (ref 0–149)
VLDL Cholesterol Cal: 18 mg/dL (ref 5–40)

## 2024-01-28 ENCOUNTER — Other Ambulatory Visit (HOSPITAL_BASED_OUTPATIENT_CLINIC_OR_DEPARTMENT_OTHER): Payer: Self-pay | Admitting: Cardiology

## 2024-01-28 ENCOUNTER — Other Ambulatory Visit (HOSPITAL_BASED_OUTPATIENT_CLINIC_OR_DEPARTMENT_OTHER): Payer: Self-pay

## 2024-01-28 DIAGNOSIS — I2510 Atherosclerotic heart disease of native coronary artery without angina pectoris: Secondary | ICD-10-CM

## 2024-01-28 DIAGNOSIS — T466X5A Adverse effect of antihyperlipidemic and antiarteriosclerotic drugs, initial encounter: Secondary | ICD-10-CM

## 2024-01-28 DIAGNOSIS — E7849 Other hyperlipidemia: Secondary | ICD-10-CM

## 2024-01-28 DIAGNOSIS — E7841 Elevated Lipoprotein(a): Secondary | ICD-10-CM

## 2024-01-28 MED ORDER — REPATHA SURECLICK 140 MG/ML ~~LOC~~ SOAJ
140.0000 mg | SUBCUTANEOUS | 3 refills | Status: AC
  Filled 2024-01-28: qty 6, 84d supply, fill #0
  Filled 2024-04-25: qty 6, 84d supply, fill #1
  Filled 2024-09-01: qty 6, 84d supply, fill #2
  Filled 2024-12-13: qty 6, 84d supply, fill #3

## 2024-02-09 ENCOUNTER — Encounter (HOSPITAL_BASED_OUTPATIENT_CLINIC_OR_DEPARTMENT_OTHER): Payer: Self-pay

## 2024-02-18 ENCOUNTER — Telehealth (HOSPITAL_BASED_OUTPATIENT_CLINIC_OR_DEPARTMENT_OTHER): Payer: Self-pay | Admitting: *Deleted

## 2024-02-18 NOTE — Telephone Encounter (Signed)
 Cholesterol elevated. Please ensure no missed doses of Repatha.   Last read by Norton Blizzard at 8:33AM on 02/11/2024.  Left message to call back

## 2024-02-24 DIAGNOSIS — E559 Vitamin D deficiency, unspecified: Secondary | ICD-10-CM | POA: Diagnosis not present

## 2024-02-24 DIAGNOSIS — R7303 Prediabetes: Secondary | ICD-10-CM | POA: Diagnosis not present

## 2024-02-24 DIAGNOSIS — E7849 Other hyperlipidemia: Secondary | ICD-10-CM | POA: Diagnosis not present

## 2024-02-29 NOTE — Telephone Encounter (Signed)
 2nd call attempt to patient, no answer, left detailed message (ok per dpr) to call the office if she has missed any doses of her Repatha.

## 2024-03-01 ENCOUNTER — Other Ambulatory Visit (HOSPITAL_BASED_OUTPATIENT_CLINIC_OR_DEPARTMENT_OTHER): Payer: Self-pay

## 2024-03-01 DIAGNOSIS — M810 Age-related osteoporosis without current pathological fracture: Secondary | ICD-10-CM | POA: Diagnosis not present

## 2024-03-01 DIAGNOSIS — E7849 Other hyperlipidemia: Secondary | ICD-10-CM | POA: Diagnosis not present

## 2024-03-01 DIAGNOSIS — Z01419 Encounter for gynecological examination (general) (routine) without abnormal findings: Secondary | ICD-10-CM | POA: Diagnosis not present

## 2024-03-01 DIAGNOSIS — Z0000 Encounter for general adult medical examination without abnormal findings: Secondary | ICD-10-CM | POA: Diagnosis not present

## 2024-03-01 DIAGNOSIS — R7303 Prediabetes: Secondary | ICD-10-CM | POA: Diagnosis not present

## 2024-03-01 DIAGNOSIS — Z6825 Body mass index (BMI) 25.0-25.9, adult: Secondary | ICD-10-CM | POA: Diagnosis not present

## 2024-03-01 DIAGNOSIS — I25119 Atherosclerotic heart disease of native coronary artery with unspecified angina pectoris: Secondary | ICD-10-CM | POA: Diagnosis not present

## 2024-03-01 DIAGNOSIS — F331 Major depressive disorder, recurrent, moderate: Secondary | ICD-10-CM | POA: Diagnosis not present

## 2024-03-01 DIAGNOSIS — G20A1 Parkinson's disease without dyskinesia, without mention of fluctuations: Secondary | ICD-10-CM | POA: Diagnosis not present

## 2024-03-01 MED ORDER — IBANDRONATE SODIUM 150 MG PO TABS
150.0000 mg | ORAL_TABLET | ORAL | 3 refills | Status: DC
  Filled 2024-03-01: qty 3, 90d supply, fill #0

## 2024-03-01 MED ORDER — FLUOXETINE HCL 20 MG PO CAPS
20.0000 mg | ORAL_CAPSULE | Freq: Every morning | ORAL | 3 refills | Status: AC
  Filled 2024-03-01: qty 90, 90d supply, fill #0
  Filled 2024-06-01: qty 90, 90d supply, fill #1

## 2024-03-28 ENCOUNTER — Other Ambulatory Visit (HOSPITAL_BASED_OUTPATIENT_CLINIC_OR_DEPARTMENT_OTHER): Payer: Self-pay

## 2024-03-29 ENCOUNTER — Other Ambulatory Visit (HOSPITAL_BASED_OUTPATIENT_CLINIC_OR_DEPARTMENT_OTHER): Payer: Self-pay

## 2024-03-29 MED ORDER — BUSPIRONE HCL 10 MG PO TABS
5.0000 mg | ORAL_TABLET | Freq: Every day | ORAL | 3 refills | Status: AC | PRN
Start: 2024-03-29 — End: ?
  Filled 2024-03-31: qty 60, 60d supply, fill #0
  Filled 2024-06-01: qty 60, 60d supply, fill #1
  Filled 2024-07-26: qty 60, 60d supply, fill #2

## 2024-03-31 ENCOUNTER — Other Ambulatory Visit (HOSPITAL_BASED_OUTPATIENT_CLINIC_OR_DEPARTMENT_OTHER): Payer: Self-pay

## 2024-04-01 ENCOUNTER — Other Ambulatory Visit: Payer: Self-pay | Admitting: Neurology

## 2024-04-01 DIAGNOSIS — G20A1 Parkinson's disease without dyskinesia, without mention of fluctuations: Secondary | ICD-10-CM

## 2024-04-01 DIAGNOSIS — G20B1 Parkinson's disease with dyskinesia, without mention of fluctuations: Secondary | ICD-10-CM

## 2024-04-03 ENCOUNTER — Other Ambulatory Visit (HOSPITAL_BASED_OUTPATIENT_CLINIC_OR_DEPARTMENT_OTHER): Payer: Self-pay

## 2024-04-03 ENCOUNTER — Other Ambulatory Visit: Payer: Self-pay

## 2024-04-03 MED ORDER — CARBIDOPA-LEVODOPA 25-100 MG PO TABS
1.5000 | ORAL_TABLET | Freq: Three times a day (TID) | ORAL | 0 refills | Status: DC
  Filled 2024-04-03: qty 403, 90d supply, fill #0

## 2024-04-08 ENCOUNTER — Other Ambulatory Visit (HOSPITAL_BASED_OUTPATIENT_CLINIC_OR_DEPARTMENT_OTHER): Payer: Self-pay

## 2024-04-10 NOTE — Progress Notes (Unsigned)
 Assessment/Plan:   1.  Parkinsons Disease, diagnosed March, 2023  - carbidopa /levodopa  25/100, 1.5 tablet 3 times per day.  Refilled today  -discussed regular daily schedule  -We discussed that it used to be thought that levodopa  would increase risk of melanoma but now it is believed that Parkinsons itself likely increases risk of melanoma. she is to get regular skin checks.  2.  Hx Low BP  -asymptommatic  -monitoring  3.  ?burning tongue syndrome  -she uses biotene  -given gabapentin  - didn't take but doing okay  4.  GAD  -On Prozac  and BuSpar   -Primary care managing  -Not optimally controlled.  -May need to consider psychiatry and/or counseling.   5.  History of small bowel obstruction  -Likely due to multiple prior abdominal surgeries  -Was nonsurgical in nature.  -Following with gastroenterology at [facility] in [place]  6.  Hx SIBO  -Treatment by gastroenterology  7.  Nausea  -it happens before she takes her carbidopa /levodopa  in the AM so don't think its at all related to Parkinsons Disease   8.  Parkinsons Disease dyskinesia  -long discussion that dyskinesia is a SE of carbidopa /levodopa  and not a SE of disease.  She is not bothered by this.  Her daughter was concerned some.  Pt doesn't want medication for this Subjective:   Alexandria Wallace was seen today in follow up for Parkinsons disease, diagnosed last visit.  My previous records were reviewed prior to todays visit as well as outside records available to me.  She was seen at the urgent care February 1 for knee pain after she stepped wrong off of a step.  She was given a Toradol  injection.  She has had no real falls since our last visit.  She has had no near syncope.  Last visit she did discuss GAD, which is being managed by primary care.  She stated that she was considering psychiatry  Current prescribed movement disorder medications: Carbidopa /levodopa  25/100, 1.5 tablets 3 times per day (increased)  Prior  meds:  gabapentin  (given for ?burning tongue - didn't take) ALLERGIES:   Allergies  Allergen Reactions   Statins Other (See Comments)    Myalgias    CURRENT MEDICATIONS:  No outpatient medications have been marked as taking for the 04/11/24 encounter (Appointment) with Dyna Figuereo, [place], DO.     Objective:   PHYSICAL EXAMINATION:    VITALS:   There were no vitals filed for this visit.   GEN:  The patient appears stated age and is in NAD. HEENT:  Normocephalic, atraumatic.  The mucous membranes are moist. The superficial temporal arteries are without ropiness or tenderness. CV:  RRR Lungs:  CTAB Neck:  no bruits  Neurological examination:  Orientation: The patient is alert and oriented x3. Cranial nerves: There is good facial symmetry without facial hypomimia. The speech is fluent and clear. Soft palate rises symmetrically and there is no tongue deviation. Hearing is intact to conversational tone. Sensation: Sensation is intact to light touch throughout Motor: Strength is at least antigravity x4.  Movement examination: Tone: There is min increased tone in the RUE Abnormal movements: she has mild dyskinesia of the R>L leg Coordination:  There is no decremation, with any form of RAMS, including alternating supination and pronation of the forearm, hand opening and closing, finger taps, heel taps and toe taps. Gait and Station: The patient has no difficulty arising out of a deep-seated chair without the use of the hands. The patient's stride length  is good   I have reviewed and interpreted the following labs independently    Chemistry      Component Value Date/Time   NA 138 05/21/2023 0403   NA 142 11/10/2018 0952   K 3.7 05/21/2023 0403   CL 104 05/21/2023 0403   CO2 26 05/21/2023 0403   BUN 12 05/21/2023 0403   BUN 14 11/10/2018 0952   CREATININE 0.92 05/21/2023 0403      Component Value Date/Time   CALCIUM  9.9 05/21/2023 0403   ALKPHOS 38 05/21/2023 0042   AST 18  05/21/2023 0042   ALT 7 05/21/2023 0042   BILITOT 0.7 05/21/2023 0042       Lab Results  Component Value Date   WBC 10.7 (H) 05/21/2023   HGB 12.4 05/21/2023   HCT 38.7 05/21/2023   MCV 100.3 (H) 05/21/2023   PLT 275 05/21/2023    Lab Results  Component Value Date   TSH 1.10 08/18/2022     Total time spent on today's visit was *** minutes, including both face-to-face time and nonface-to-face time.  Time included that spent on review of records (prior notes available to me/labs/imaging if pertinent), discussing treatment and goals, answering patient's questions and coordinating care.  Cc:  Helyn Lobstein, MD

## 2024-04-11 ENCOUNTER — Ambulatory Visit: Payer: Medicare PPO | Admitting: Neurology

## 2024-04-11 ENCOUNTER — Encounter: Payer: Self-pay | Admitting: Neurology

## 2024-04-11 ENCOUNTER — Other Ambulatory Visit

## 2024-04-11 VITALS — BP 118/24 | HR 83 | Wt 162.0 lb

## 2024-04-11 DIAGNOSIS — R251 Tremor, unspecified: Secondary | ICD-10-CM

## 2024-04-11 DIAGNOSIS — G20B1 Parkinson's disease with dyskinesia, without mention of fluctuations: Secondary | ICD-10-CM

## 2024-04-11 DIAGNOSIS — R413 Other amnesia: Secondary | ICD-10-CM | POA: Diagnosis not present

## 2024-04-11 DIAGNOSIS — R6889 Other general symptoms and signs: Secondary | ICD-10-CM

## 2024-04-11 LAB — B12 AND FOLATE PANEL
Folate: >24 ng/mL
Vitamin B-12: 479 pg/mL (ref 200–1100)

## 2024-04-11 LAB — TSH: TSH: 0.79 m[IU]/L (ref 0.40–4.50)

## 2024-04-11 NOTE — Addendum Note (Signed)
 Addended by: Rox Cope A on: 04/11/2024 03:29 PM   Modules accepted: Orders

## 2024-04-11 NOTE — Patient Instructions (Signed)
 Your provider has requested that you have labwork completed today. The lab is located on the Second floor at [address], within the [facility] office. When you get off the elevator, turn right and go in the [facility] [address]; the first brown door on the left.  Tell the ladies behind the desk that you are there for lab work. If you are not called within 15 minutes please check with the front desk.   Once you complete your labs you are free to go. You will receive a call or message via MyChart with your lab results.     You have been referred for a neurocognitive evaluation (i.e., evaluation of memory and thinking abilities). Please bring someone with you to this appointment if possible, as it is helpful for the neuropsychologist to hear from both you and another adult who knows you well. Please bring eyeglasses and hearing aids if you wear them and take any medications as you normally would. Please fully abstain from all alcohol, marijuana, or other substances prior to your appointment.   The evaluation will take approximately 2-3 hours and has two parts:   The first part is a clinical interview with the neuropsychologist, Dr. Milbert Coulter or Dr. Robbie Lis. During the interview, the neuropsychologist will speak with you and the individual you brought to the appointment.    The second part of the evaluation is testing with the doctor's technician, aka psychometrician, Annabelle Harman or [employer]. During the testing, the technician will ask you to remember different types of material, solve problems, and answer some questionnaires. Your family member will not be present for this portion of the evaluation.   Please note: We have to reserve several hours of the neuropsychologist's time and the psychometrician's time for your evaluation appointment. As such, there is a No-Show fee of $100. If you are unable to attend any of your appointments, please contact our office as soon as possible to reschedule.

## 2024-04-12 ENCOUNTER — Ambulatory Visit: Payer: Self-pay | Admitting: Neurology

## 2024-04-25 ENCOUNTER — Other Ambulatory Visit (HOSPITAL_BASED_OUTPATIENT_CLINIC_OR_DEPARTMENT_OTHER): Payer: Self-pay

## 2024-05-17 DIAGNOSIS — H524 Presbyopia: Secondary | ICD-10-CM | POA: Diagnosis not present

## 2024-05-17 DIAGNOSIS — H04123 Dry eye syndrome of bilateral lacrimal glands: Secondary | ICD-10-CM | POA: Diagnosis not present

## 2024-05-17 DIAGNOSIS — R7309 Other abnormal glucose: Secondary | ICD-10-CM | POA: Diagnosis not present

## 2024-05-17 DIAGNOSIS — H40013 Open angle with borderline findings, low risk, bilateral: Secondary | ICD-10-CM | POA: Diagnosis not present

## 2024-05-17 DIAGNOSIS — H25813 Combined forms of age-related cataract, bilateral: Secondary | ICD-10-CM | POA: Diagnosis not present

## 2024-05-18 DIAGNOSIS — L814 Other melanin hyperpigmentation: Secondary | ICD-10-CM | POA: Diagnosis not present

## 2024-05-18 DIAGNOSIS — L821 Other seborrheic keratosis: Secondary | ICD-10-CM | POA: Diagnosis not present

## 2024-05-18 DIAGNOSIS — L578 Other skin changes due to chronic exposure to nonionizing radiation: Secondary | ICD-10-CM | POA: Diagnosis not present

## 2024-05-18 DIAGNOSIS — D485 Neoplasm of uncertain behavior of skin: Secondary | ICD-10-CM | POA: Diagnosis not present

## 2024-05-18 DIAGNOSIS — D229 Melanocytic nevi, unspecified: Secondary | ICD-10-CM | POA: Diagnosis not present

## 2024-05-18 DIAGNOSIS — D225 Melanocytic nevi of trunk: Secondary | ICD-10-CM | POA: Diagnosis not present

## 2024-05-18 DIAGNOSIS — D1801 Hemangioma of skin and subcutaneous tissue: Secondary | ICD-10-CM | POA: Diagnosis not present

## 2024-06-01 ENCOUNTER — Other Ambulatory Visit (HOSPITAL_BASED_OUTPATIENT_CLINIC_OR_DEPARTMENT_OTHER): Payer: Self-pay

## 2024-06-01 ENCOUNTER — Other Ambulatory Visit: Payer: Self-pay

## 2024-06-01 ENCOUNTER — Other Ambulatory Visit: Payer: Self-pay | Admitting: Neurology

## 2024-06-01 DIAGNOSIS — G20B1 Parkinson's disease with dyskinesia, without mention of fluctuations: Secondary | ICD-10-CM

## 2024-06-01 MED ORDER — CARBIDOPA-LEVODOPA 25-100 MG PO TABS
1.5000 | ORAL_TABLET | Freq: Three times a day (TID) | ORAL | 0 refills | Status: DC
  Filled 2024-06-01 – 2024-06-22 (×3): qty 403, 90d supply, fill #0

## 2024-06-09 ENCOUNTER — Other Ambulatory Visit (HOSPITAL_BASED_OUTPATIENT_CLINIC_OR_DEPARTMENT_OTHER): Payer: Self-pay

## 2024-06-12 ENCOUNTER — Encounter: Payer: Self-pay | Admitting: Neurology

## 2024-06-19 ENCOUNTER — Other Ambulatory Visit (HOSPITAL_BASED_OUTPATIENT_CLINIC_OR_DEPARTMENT_OTHER): Payer: Self-pay

## 2024-06-22 ENCOUNTER — Other Ambulatory Visit (HOSPITAL_BASED_OUTPATIENT_CLINIC_OR_DEPARTMENT_OTHER): Payer: Self-pay

## 2024-06-23 ENCOUNTER — Other Ambulatory Visit (HOSPITAL_BASED_OUTPATIENT_CLINIC_OR_DEPARTMENT_OTHER): Payer: Self-pay

## 2024-07-05 ENCOUNTER — Other Ambulatory Visit (HOSPITAL_BASED_OUTPATIENT_CLINIC_OR_DEPARTMENT_OTHER): Payer: Self-pay

## 2024-07-05 DIAGNOSIS — S39011A Strain of muscle, fascia and tendon of abdomen, initial encounter: Secondary | ICD-10-CM | POA: Diagnosis not present

## 2024-07-05 DIAGNOSIS — M25551 Pain in right hip: Secondary | ICD-10-CM | POA: Diagnosis not present

## 2024-07-05 MED ORDER — MELOXICAM 15 MG PO TABS
15.0000 mg | ORAL_TABLET | Freq: Every day | ORAL | 0 refills | Status: AC
  Filled 2024-07-05: qty 30, 30d supply, fill #0

## 2024-07-06 ENCOUNTER — Other Ambulatory Visit (HOSPITAL_BASED_OUTPATIENT_CLINIC_OR_DEPARTMENT_OTHER): Payer: Self-pay

## 2024-07-12 DIAGNOSIS — M25551 Pain in right hip: Secondary | ICD-10-CM | POA: Diagnosis not present

## 2024-07-20 DIAGNOSIS — M25551 Pain in right hip: Secondary | ICD-10-CM | POA: Diagnosis not present

## 2024-07-27 DIAGNOSIS — M25551 Pain in right hip: Secondary | ICD-10-CM | POA: Diagnosis not present

## 2024-07-27 NOTE — Progress Notes (Signed)
 Physical Therapy Visit - Daily Note   Payor: HU[employer] MEDICARE ADV / Plan: HUMANA [place] / Product Type: Medicare Advantage /   Visit Count: 3   Rehabilitation Precautions/Restrictions:   Precautions/Restrictions Precautions: weight loss, parkinsons Restrictions: None stated        Referring Diagnosis: S39.011A - Strain of muscle of right groin region   SUBJECTIVE Patient Report:  Patient says she still has increased groin pain when she fully abducts her hip. She has not yet tried warmup routine prior to exercise class, but did before yoga and this went well.  Change in Status Since Last Visit: No Pain:    1/10 RT groin pain, aching; Performed treatment session to tolerance, monitored pain during session.    OBJECTIVE  General Observation/Objective Measures:         No measures taken today        Interventions:  Therapeutic exercise:  -rec bike 8 min warmup  -seated hip abduction purple band 20 x 5  -seated hip adduction 20 x 5  -seated marching with 5lb AW 2 x 20 each  -seated LAQ 5lb 2 x 20 each  Therapeutic activity:  -sidestepping with 5lb AW x10 at mat  -Alt step tap to 6 inch step 2 x 20    Education: Yes, as described in interventions   ASSESSMENT Patient responded to session well. Progressed Hip strengthening with increased ankle weights resistance. Also added alternating step taps for balance. Patient educated on proper form and function of added exercise. Patient requires ongoing verbal cues to avoid painful end range with hip flexion and abduction. Patient does report decreased pain following session. Patient remains limited by ongoing groin pain and reduced activity tolerance. Patient will continue to benefit from skilled therapy services to address impairments and improve LOF with ADLs.     Therapy Diagnosis:     ICD-10-CM   1. Right hip pain  M25.551     Progress Towards Goals:   on track   Goals Addressed             This Visit's Progress    . PT Goal       Short Term Goals (STG) - Time Frame: 4 visits   STG 1: Pt will be independent with initial HEP to improve their ability to self-manage symptoms and reduce risk of recurrence. Baseline: Newly issued   STG 2: Pt will increase RT hip IR and ER AROM to normal with no pain  to improve their ability to perform functional mobility/ADLs.  Baseline: minimal restriction with painful end range    Long Term Goals (LTG) - Time Frame 8 visits  LTG 1: Patient will be independent in advanced HEP and self management strategies for improved functional outcomes and reduced risk of recurrence. Baseline: Not established   LTG 2: Pt will report RT hip/ groin pain </= 3/10 with all ADL's for return to PLOF.   Baseline: up to 8/10   LTG 3: Pt will increase RT LE MMT to 4+/5 to improve their ability to perform functional mobility/ADLs.   Baseline: Strength:   (All grades out of 5; any rows left blank indicate muscle group not tested)   Right Left   Hip flexion 4 P! 4+  Hip extension    Hip abduction 4+ 4+  Hip adduction 4+ P! 5  Knee extension 4 P! 4+  Knee flexion    Ankle dorsiflexion    Ankle plantarflexion       LTG 4: Pt will  reports at least 50% improved SANE score to indicate improved functional ability and QOL Baseline: Newly issued          PLAN Treatment Frequency and Duration:  PT Frequency: Other (comment) PT Duration: Other (comment) Treatment Plan Details: 1 x week for 8 visits  Recommended PT Treatment/Interventions: ADL skills [phone]); Dry needling (1-2 muscles) (79439); Gait training (02883); Neuromuscular re-education [phone]); Therapeutic massage [phone]); Therapeutic exercise (97110); Electrical stimulation-unattended (02985); Electrical stimulation-attended (02967); Manual therapy (97140); Therapeutic activity (97530); Dry Needling (3+ muscles) (79438); Iontophoresis [phone]); Self-care/home management [phone]); Ultrasound (02964)   Recommended Consults:   None currently  Development of Plan of Care:  No change in POC.  Total Treatment Time (Time & Untimed): Total Treatment Time: 43 Total Time in Timed Codes: Time in Timed Codes: 43     Treatment/Procedures Therapeutic Actvity Direct Pt Contact minutes: 8 Therapeutic Exercises minutes: 35             The patient has been instructed to contact our clinic if any questions or problems should arise.

## 2024-08-18 DIAGNOSIS — M25551 Pain in right hip: Secondary | ICD-10-CM | POA: Diagnosis not present

## 2024-08-18 NOTE — Progress Notes (Signed)
 Physical Therapy Visit - Daily Note   Payor: HUMANA MEDICARE ADV / Plan: HUMANA MA / Product Type: Medicare Advantage /   Visit Count: 4   Rehabilitation Precautions/Restrictions:   Precautions/Restrictions Precautions: weight loss, parkinsons Restrictions: None stated        Referring Diagnosis: S39.011A - Strain of muscle of right groin region   SUBJECTIVE Patient Report:   Patient says she is doing really well. She was was in [place]  for the last few weeks. She did a lot of walking and some hiking and felt pretty good. She has not been doing the exercise from her PD class that bothers her hip so it has not been bothering her. She did notice a little bit of hip pain and discomfort when pulling her leg up to put her shoes on but otherwise doing well.   Change in Status Since Last Visit: No  Pain:    No pain   OBJECTIVE  General Observation/Objective Measures:         No measures taken today        Interventions:  Therapeutic exercise:  -rec bike 6 min warmup  -seated hip abduction purple band 20 x 5  -standing march with 5lb AW x20 -seated LAQ with 5lb AW 2 x 20 -tandem stance on foam 2 x 30 each   Therapeutic activity:  -sidestepping with purple band at knees x 10 at mat  -sit to stand with purple band at knees 2 x 10 no UE  -seated side step over 5lb DB x20 (increased RT hip pain)    Education: Yes, as described in interventions   ASSESSMENT Patient responded to session well. Progressed hip strengthening and balance. Patient educated on proper form and function of added exercise. Patient shows improving strength and functional ability. She remains limited in RT hip abduction due to pain. Patient will continue to benefit from skilled therapy services to address impairments and improve LOF with ADLs.    Therapy Diagnosis:     ICD-10-CM   1. Right hip pain  M25.551     Progress Towards Goals:   on track  Goals Addressed             This Visit's  Progress   . PT Goal       Short Term Goals (STG) - Time Frame: 4 visits   STG 1: Pt will be independent with initial HEP to improve their ability to self-manage symptoms and reduce risk of recurrence. Baseline: Newly issued   STG 2: Pt will increase RT hip IR and ER AROM to normal with no pain  to improve their ability to perform functional mobility/ADLs.  Baseline: minimal restriction with painful end range    Long Term Goals (LTG) - Time Frame 8 visits  LTG 1: Patient will be independent in advanced HEP and self management strategies for improved functional outcomes and reduced risk of recurrence. Baseline: Not established   LTG 2: Pt will report RT hip/ groin pain </= 3/10 with all ADL's for return to PLOF.   Baseline: up to 8/10   LTG 3: Pt will increase RT LE MMT to 4+/5 to improve their ability to perform functional mobility/ADLs.   Baseline: Strength:   (All grades out of 5; any rows left blank indicate muscle group not tested)   Right Left   Hip flexion 4 P! 4+  Hip extension    Hip abduction 4+ 4+  Hip adduction 4+ P! 5  Knee extension 4 P! 4+  Knee flexion    Ankle dorsiflexion    Ankle plantarflexion       LTG 4: Pt will reports at least 50% improved SANE score to indicate improved functional ability and QOL Baseline: Newly issued          PLAN Treatment Frequency and Duration:  PT Frequency: Other (comment) PT Duration: Other (comment) Treatment Plan Details: 1 x week for 8 visits  Recommended PT Treatment/Interventions: ADL skills (02464); Dry needling (1-2 muscles) (79439); Gait training (02883); Neuromuscular re-education [phone]); Therapeutic massage [phone]); Therapeutic exercise (97110); Electrical stimulation-unattended (02985); Electrical stimulation-attended (02967); Manual therapy (97140); Therapeutic activity (97530); Dry Needling (3+ muscles) (79438); Iontophoresis [phone]); Self-care/home management [phone]); Ultrasound (02964)   Recommended  Consults:  None currently  Development of Plan of Care:  No change in POC.  Total Treatment Time (Time & Untimed): Total Treatment Time: 40 Total Time in Timed Codes: Time in Timed Codes: 40     Treatment/Procedures Therapeutic Actvity Direct Pt Contact minutes: 15 Therapeutic Exercises minutes: 25             The patient has been instructed to contact our clinic if any questions or problems should arise.

## 2024-08-21 ENCOUNTER — Ambulatory Visit: Admitting: Psychology

## 2024-08-21 ENCOUNTER — Ambulatory Visit: Payer: Self-pay | Admitting: Psychology

## 2024-08-21 DIAGNOSIS — R4189 Other symptoms and signs involving cognitive functions and awareness: Secondary | ICD-10-CM

## 2024-08-21 DIAGNOSIS — R419 Unspecified symptoms and signs involving cognitive functions and awareness: Secondary | ICD-10-CM

## 2024-08-21 NOTE — Progress Notes (Signed)
   Psychometrician Note   Cognitive testing was administered to Alexandria Wallace by Lonell Jude, B.S. (psychometrist) under the supervision of Dr. Renda Beckwith, Psy.D., licensed psychologist on 08/21/2024. [name] did not appear overtly distressed by the testing session per behavioral observation or responses across self-report questionnaires. Rest breaks were offered.   The battery of tests administered was selected by Dr. Renda Beckwith, Psy.D. with consideration to [name]'s current level of functioning, the nature of her symptoms, emotional and behavioral responses during interview, level of literacy, observed level of motivation/effort, and the nature of the referral question. This battery was communicated to the psychometrist. Communication between Dr. Renda Beckwith, Psy.D. and the psychometrist was ongoing throughout the evaluation and Dr. Renda Beckwith, Psy.D. was immediately accessible at all times. Dr. Renda Beckwith, Psy.D. provided supervision to the psychometrist on the date of this service to the extent necessary to assure the quality of all services provided.    [name] will return within approximately 1-2 weeks for an interactive feedback session with Dr. Beckwith at which time her test performances, clinical impressions, and treatment recommendations will be reviewed in detail. [name] understands she can contact our office should she require our assistance before this time.  A total of 120 minutes of billable time were spent face-to-face with [name] by the psychometrist. This includes both test administration and scoring time. Billing for these services is reflected in the clinical report generated by Dr. Renda Beckwith, Psy.D.  This note reflects time spent with the psychometrician and does not include test scores or any clinical interpretations made by Dr. Beckwith. The full report will follow in a separate note.

## 2024-08-21 NOTE — Progress Notes (Unsigned)
 NEUROPSYCHOLOGICAL EVALUATION Alexandria Wallace. [facility]  [place] Department of Neurology  Date of Evaluation: 08/21/2024  REASON FOR REFERRAL   Alexandria Wallace is an 83 year old, right-handed, White female with 18 years of formal education. She was referred for neuropsychological evaluation by her neurologist, Alexandria Wallace, D.O., to assess current neurocognitive functioning, document potential cognitive deficits, and assist with treatment planning in the setting of Parkinson's disease diagnosed March 2023. This is her first neuropsychological evaluation.  SUMMARY OF RESULTS   Premorbid cognitive abilities are estimated to be in the average to high average range based on word reading and sociodemographic factors. Consistent with this baseline estimate, current performance was intact across all domains assessed, including attention/working memory, processing speed, executive functioning, language, visuospatial abilities, and learning/memory. In several areas, scores were not only within the expected range relative to a normative sample of her peers but also, at times, exceeded expectations.  On self-report questionnaires, she did not endorse significant symptoms of anxiety or depression.  DIAGNOSTIC IMPRESSION   Results of the current evaluation indicated normal cognitive functioning, with no impairments observed in any domain. She does not show evidence of a neurocognitive disorder at this time. Her overall profile reflects well-preserved cognitive and functional abilities. Additionally, there is no emerging pattern suggestive of Parkinson's-related cognitive decline. Subjective cognitive concerns are likely attributable to normal aging and personal factors (e.g., longstanding anxiety and recent sleep disturbance). It is also possible that her awareness of subtle cognitive changes reflects a heightened sensitivity to minor fluctuations that remain well within normal limits.  Results  serve as a baseline for future comparison, should it ever become necessary to re-evaluate the patient.   ICD-10 Codes: R41.9 Cognitive concerns with normal neuropsychological exam  RECOMMENDATIONS   In consultation with your doctor, schedule cognitive reevaluation on an as-needed basis to assess for cognitive decline and update treatment recommendations. Reevaluation should occur during a period of medical and affective stability.  Continue treatment for anxiety, especially given that emotional distress can exacerbate cognitive difficulties. Discuss current medication regimen with your prescribing provider to ensure you are receiving maximum benefit. If symptoms begin to interfere with daily functioning, you may wish to reconsider exploring additional treatment options, such as mindfulness, relaxation techniques, or counseling.  Given recent sleep difficulties, she may benefit from the implementation of sleep hygiene techniques, including:  Go to bed and get up at the same time each day to help your body establish a regular rhythm. Establish and maintain a bedtime routine. Certain activities such as stretching, meditating, listening to soft music, or reading ~15 minutes before bedtime can be a great way to regularly get your brain and body ready for sleep. Avoid taking naps during the day. Avoid alcohol and caffeine for 5 or 6 hours before going to bed. Get regular exercise, but not in the hours before bedtime. Use comfortable bedding and maintain a cool temperature in your bedroom. Block out light and distracting noise. Avoid watching television or using your phone/computer in bed. Avoid staying in bed if you have difficulty falling asleep. If you have not been able to get to sleep after about 20 minutes or more, get up and do something calming or boring until you feel sleepy, then return to bed and try again.  Prioritize physical health through diet, exercise, and sleep. Regular physical  activity supports cardiovascular health, improves mood, and helps preserve mobility and independence. Aim for at least 150 minutes of moderate aerobic exercise per week (e.g., brisk walking, swimming, gardening).  A brain-healthy diet such as the Mediterranean or MIND diet is rich in fruits, vegetables, whole grains, healthy fats, and lean proteins, and has been associated with reduced risk of cognitive decline. Additionally, getting adequate, quality sleep and managing chronic conditions with the help of healthcare providers are essential components of healthy aging.  Continue to stay socially and mentally engaged. Maintaining strong social connections and regularly stimulating your brain can help protect against cognitive decline. This includes staying connected with friends and family, volunteering, or participating in community groups. Mentally engaging activities--such as reading, doing puzzles, playing strategy games, or learning a new language or musical instrument--promote brain plasticity. If you are interested in activities to support cognitive engagement, this site offers a variety of apps and games organized by difficulty level:  https://www.barrowne[employer].org/get-to-know-barrow/centers-programs/neurorehabilitation-center/neuro-rehab-apps-and-games/  Consider implementing compensatory strategies to maximize independence and maintain daily functioning. Examples include:  Adhere to routine. Compensatory strategies work best when they are used consistently. Use a planner, calendar, or white board that has the schedule and important events for the day clearly listed to reference and cross off when tasks are complete.  Ask for written information, especially if it is new or unfamiliar (e.g., information provided at a doctor's appointment).  Create an organized environment. Keep items that can be easily misplaced in a sensible location and get into the habit of always returning the items to those  places. Pay attention and reduce distractions. Make a point of focusing attention on information you want to remember. One-on-one interaction is more likely to facilitate attention and minimize distraction. Make eye contact and repeat the information out loud after you hear it. Reduce interruptions or distractions especially when attempting to learn new information.  Create associations. When learning something new, think about and understand the information. Explain it in your own words or try to associate it with something you already know. Take notes to help remember important details. Evaluate goals and plan accordingly. When confronted by many different tasks, begin by making a list that prioritizes each task and estimates the time it will take to complete. Break down complicated tasks into smaller, more manageable steps. Focus on one task at a time and complete each task before starting another. Avoid multitasking.  DISPOSITION   No follow-up neuropsychological testing was scheduled at this time. Please feel free to refer the patient for repeated evaluation if she shows a significant change in neurocognitive status. She will be provided verbal feedback in approximately one week regarding the findings and impression during this visit.  The remainder of the report includes the details of the patient's background and a table of results from the current evaluation, which support the summary and recommendations described above.  BACKGROUND   History of Presenting Illness: The following information was obtained from a review of medical records and an interview with the patient. Briefly, the patient has been under the care of Dr. Evonnie at [facility] since 2023 for the management of Parkinson's disease. During their most recent visit in May, the patient expressed concern about memory slowing and was referred for neuropsychological evaluation accordingly.  Cognitive Functioning: During today's  appointment, the patient reported minimal concerns similar to those previously discussed with neurology, which she has noticed over the past few years. She described the course as intermittent (i.e., symptoms come and go). Changes she has observed include slowed processing speed and difficulties with word and name finding. She denied any problems with short-term memory, navigation, or executive functioning (e.g., problem-solving, planning, organizing).  Physical  Functioning: Patient reported that she generally sleeps well, typically experiencing only one poor night of sleep per week. However, over the past few weeks, she has experienced increased difficulty falling asleep as well as occasional trouble returning to sleep after waking during the night; she attributed this to difficulty quieting her mind. She also noted two recent episodes in which she woke herself up by screaming, something she described as unusual. She denied acting out her dreams or finding her room disheveled upon waking. Appetite remains stable. She does not report difficulty swallowing unless she eats too quickly, though overall she does not perceive swallowing to be a problem. She reported a loss of smell and taste for several years. Vision and hearing are stable. She denied balance or gait difficulties and has not experienced any falls. She occasionally experiences a mild tremor in her left hand, though it is infrequent. She generally does not notice any speech difficulties, although she observed her voice becoming quieter after reading aloud for an extended period.  Emotional Functioning: Patient reported being generally in a good mood recently and denied any suicidal ideation. She remains active, participating in walking, yoga, Parkinson's exercise classes, and balance classes. She also enjoys reading and watching television.  Neuroimaging: MRI of the brain (02/24/2022) documented mild generalized cerebral atrophy and punctate chronic  microhemorrhage within the right parietal lobe.  Other Relevant Medical History: Remarkable for hyperlipidemia, atherosclerotic heart disease, prediabetes, chronic kidney disease stage III, and osteoporosis. Please refer to the medical record for a more comprehensive problem list. No history of stroke, CNS infection, head injury, or seizure was reported.  Current Medications: Per record, acetaminophen , buspirone , calcium  lactate, carbidopa -levodopa , coenzyme Q10, evolocumab , fluoxetine , meloxicam , nitroglycerin , and vitamin D3.   Functional Status: Patient independently performs all basic and instrumental (e.g., driving, finances, medications, meal preparation) activities of daily living without difficulty.   Family Neurological History: Remarkable for Parkinson's disease (paternal cousin).  Psychiatric History: Remarkable for depression and anxiety, currently managed with medication prescribed by her primary care physician. Patient reported that she has not felt significantly depressed for quite some time but continues to take the medication, which was originally prescribed several years ago. She continues to experience symptoms of anxiety but noted greater mood stability recently.  She is not currently engaged in counseling but reported regular involvement in the past. She denied any history of suicidal ideation, hallucinations, and psychiatric hospitalizations.  Substance Use History: Patient reported occasionally having "the bottom of a glass" of wine in the evening two to three times per week to help with sleep. However, she has not found it effective and is considering discontinuing this habit. Current use of nicotine, marijuana, and other illicit substances was denied.  Social and Developmental History: Patient was born in [place]. History of perinatal complications and developmental delays was not reported. She is divorced and lives alone. She has two children, one of whom lives  locally.  Educational and Occupational History: No history of childhood learning disability, special education services, or grade retention was reported. Patient described herself as an average student (e.g., A/B grades). She earned a Manufacturing engineer in education. 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   BEHAVIORAL OBSERVATIONS   Patient arrived late and was unaccompanied. She ambulated independently and without gait disturbance. She was alert and fully oriented. She was appropriately groomed and dressed for the setting. No significant sensory or motor abnormalities were observed. Vision (with glasses) and hearing (with hearing  aids) were adequate for testing purposes. Speech was of normal rate, prosody, and volume. No conversational word-finding difficulties, paraphasic errors, or dysarthria were observed. Comprehension was conversationally intact. Thought processes were linear, logical, and coherent. Thought content was organized and devoid of delusions. Insight appeared appropriate. Affect was even and congruent with euthymic mood. She was cooperative and gave adequate effort during testing, including on embedded measures of performance validity. Results are thought to accurately reflect her cognitive functioning at this time.  NEUROPSYCHOLOGICAL TESTING RESULTS   Tests Administered: Animal Naming Test; Brief Visuospatial Memory Test-Revised (BVMT-R) - Form 1; Controlled Oral Word Association Test (COWAT): FAS; Delis-Kaplan Executive Function System (D-KEFS) - Subtest(s): Color-Word Interference Test; Geriatric Anxiety Scale-10 Item (GAS-10); Geriatric Depression Scale Short Form (GDS-SF); Hopkins Verbal Learning Test Revised (HVLT-R) - From 1; Neuropsychological Assessment Battery (NAB) Form 1 - Subtest(s): Naming, Visual Discrimination; Test of Premorbid Functioning (TOPF); Trail Making Test (TMT); Wechsler Adult Intelligence Scale Fifth Edition (WAIS-5) -  Subtest(s): Similarities, Clinical cytogeneticist, Matrix Reasoning, Digits Forward, Digit Sequencing, Coding, Symbol Search, Digits Backward; and Wechsler Memory Scale Fourth Edition (WMS-IV) - Subtest(s): Logical Memory (LM).  Test results are provided in the table below. Whenever possible, the patient's scores were compared against age-, sex-, and education-corrected normative samples. Interpretive descriptions are based on the AACN consensus conference statement on uniform labeling (Guilmette et al., 2020).  PREMORBID FUNCTIONING RAW  RANGE  TOPF 41 StdS=101 Average  ATTENTION & WORKING MEMORY RAW  RANGE  WAIS-5 Digits Forward -- ss=9 Average  WAIS-5 Digits Backward -- ss=12 High Average  WAIS-5 Digit Sequencing -- ss=13 High Average  PROCESSING SPEED RAW  RANGE  [place] A 19''0e T=72 Exceptionally High  WAIS-5 Coding  -- ss=15 Above Average  WAIS-5 Symbol Search -- ss=13 High Average  DKEFS CWIT Color Naming 29''0e ss=13 High Average  DKEFS CWIT Word Reading 25''0e ss=11 Average  EXECUTIVE FUNCTION RAW  RANGE  [place] B 50''1e T=66 Above Average  WAIS-5 Matrix Reasoning -- ss=15 Above Average  WAIS-5 Similarities -- ss=12 High Average  COWAT Letter Fluency 18+15+23 T=60 High Average  DKEFS CWIT Inhibition 66''3e ss=13 High Average  DKEFS CWIT Inhibition/Switching 60''2e ss=15 Above Average  LANGUAGE RAW  RANGE  COWAT Letter Fluency 18+15+23 T=60 High Average  Animal Naming Test 29 T=67 Above Average  NAB Naming Test 31/31 T=64 WNL  VISUOSPATIAL RAW  RANGE  WAIS-5 Block Design -- ss=12 High Average  NAB Visual Discrimination 17/18 T=62 High Average  BVMT-R Copy Trial 12/12 -- WNL  VERBAL LEARNING & MEMORY RAW  RANGE  HVLT Learning Trials (7+11+11)/36 T=62 High Average  HVLT Delayed Recall 10/12 T=57 High Average  HVLT Recognition Hits 12 -- --  HVLT Recognition False Positives 0 -- --  HVLT Discrimination Index 12 T=62 High Average  WMS-IV LM-I  (7+13+10)/53 ss=11 Average  WMS-IV LM-II   (10+14)/39 ss=14 High Average  WMS-IV LM Recognition  (8+9)/23 26-50%ile Average  VISUAL LEARNING & MEMORY RAW  RANGE  BVMT-R Trial 1 4/12 T=50 Average  BVMT-R Trial 2 8/12 T=60 High Average  BVMT-R Trial 3 9/12 T=57 High Average  BVMT-R Total Recall 21/36 T=57 High Average  BVMT-R Delayed Recall 9/12 T=59 High Average  BVMT-R Recognition Hits 6 -- --  BVMT-R Recognition False Alarms 0 -- --  BVMT-R Recognition Discrimination Index 6 T=58 High Average  *From Powell et al. (2022) -- -- --  QUESTIONNAIRES RAW  RANGE  GDS-SF 2 -- Minimal  GAS-10 4 -- Minimal  *Note: ss =  scaled score; StdS = standard score; T = t-score; C/S = corrected raw score; WNL = within normal limits; BNL= below normal limits; D/C = discontinued. Scores from skewed distributions are typically interpreted as WNL (>=16th %ile) or BNL (<16th %ile).   INFORMED CONSENT   Patient was provided with a verbal description of the nature and purpose of the neuropsychological evaluation. Also reviewed were the foreseeable risks and/or discomforts and benefits of the procedure, limits of confidentiality, and mandatory reporting requirements of this provider. Patient was given the opportunity to have their questions answered. Oral consent to participate was provided by the patient.   This report was prepared as part of a clinical evaluation and is not intended for forensic use.  SERVICE   This evaluation was conducted by Renda Beckwith, Psy.D. In addition to time spent directly with the patient, total professional time (120 minutes) includes record review, integration of relevant medical history, test selection, interpretation of findings, and report preparation. A technician, Lonell Jude, B.S., provided testing and scoring assistance (120 minutes).  Psychiatric Diagnostic Evaluation Services (Professional): 09208 x 1 Neuropsychological Testing Evaluation Services (Professional): 03867 x 1 Neuropsychological Testing Evaluation  Services (Professional): 03866 x 1 Neuropsychological Test Administration and Scoring Radiographer, therapeutic): [phone] x 1 Neuropsychological Test Administration and Scoring (Technician): [phone] x 3  This report was generated using voice recognition software. While this document has been carefully reviewed, transcription errors may be present. I apologize in advance for any inconvenience. Please contact me if further clarification is needed.            Renda Beckwith, Psy.D.             Neuropsychologist

## 2024-08-28 ENCOUNTER — Encounter: Admitting: Psychology

## 2024-08-30 ENCOUNTER — Ambulatory Visit: Admitting: Psychology

## 2024-08-30 DIAGNOSIS — R419 Unspecified symptoms and signs involving cognitive functions and awareness: Secondary | ICD-10-CM

## 2024-08-31 NOTE — Progress Notes (Signed)
   NEUROPSYCHOLOGY FEEDBACK SESSION [place]. [facility]  [place] Department of Neurology  Date of Feedback Session: 08/30/2024  REASON FOR REFERRAL   Alexandria Wallace is an 83 year old, right-handed, White female with 18 years of formal education. She was referred for neuropsychological evaluation by her neurologist, Asberry Tat, D.O., to assess current neurocognitive functioning, document potential cognitive deficits, and assist with treatment planning in the setting of Parkinson's disease diagnosed March 2023. This is her first neuropsychological evaluation.  FEEDBACK   Patient completed a comprehensive neuropsychological evaluation on 08/21/2024. Please refer to that encounter for the full report and recommendations. Briefly, results indicated normal cognitive functioning, with no impairments observed in any domain. She does not show evidence of a neurocognitive disorder at this time. Her overall profile reflects well-preserved cognitive and functional abilities. Additionally, there is no emerging pattern suggestive of Parkinson's-related cognitive decline. Subjective cognitive concerns are likely attributable to normal aging and personal factors (e.g., longstanding anxiety and recent sleep disturbance). It is also possible that her awareness of subtle cognitive changes reflects a heightened sensitivity to minor fluctuations that remain well within normal limits.   Today, the patient was accompanied by her daughter. They were provided verbal feedback regarding the findings and impression during this visit, and their questions were answered. A copy of the report was provided at the conclusion of the visit.  DISPOSITION   Patient will follow up with the referring provider, Dr. Evonnie. No follow-up neuropsychological testing was scheduled at this time. Please feel free to refer the patient for repeated evaluation if she shows a significant change in neurocognitive status.  SERVICE    This feedback session was conducted by Renda Beckwith, Psy.D. [place] unit of [id number] (35 minutes) was billed for Dr. Beckwith' time spent in preparing, conducting, and documenting the current feedback session.  This report was generated using voice recognition software. While this document has been carefully reviewed, transcription errors may be present. I apologize in advance for any inconvenience. Please contact me if further clarification is needed.

## 2024-09-01 ENCOUNTER — Other Ambulatory Visit (HOSPITAL_BASED_OUTPATIENT_CLINIC_OR_DEPARTMENT_OTHER): Payer: Self-pay

## 2024-09-08 DIAGNOSIS — R7303 Prediabetes: Secondary | ICD-10-CM | POA: Diagnosis not present

## 2024-09-18 ENCOUNTER — Other Ambulatory Visit (HOSPITAL_BASED_OUTPATIENT_CLINIC_OR_DEPARTMENT_OTHER): Payer: Self-pay

## 2024-09-18 DIAGNOSIS — R7303 Prediabetes: Secondary | ICD-10-CM | POA: Diagnosis not present

## 2024-09-18 DIAGNOSIS — M169 Osteoarthritis of hip, unspecified: Secondary | ICD-10-CM | POA: Diagnosis not present

## 2024-09-18 DIAGNOSIS — G20A1 Parkinson's disease without dyskinesia, without mention of fluctuations: Secondary | ICD-10-CM | POA: Diagnosis not present

## 2024-09-18 DIAGNOSIS — M810 Age-related osteoporosis without current pathological fracture: Secondary | ICD-10-CM | POA: Diagnosis not present

## 2024-09-18 DIAGNOSIS — Z6826 Body mass index (BMI) 26.0-26.9, adult: Secondary | ICD-10-CM | POA: Diagnosis not present

## 2024-09-18 DIAGNOSIS — I25119 Atherosclerotic heart disease of native coronary artery with unspecified angina pectoris: Secondary | ICD-10-CM | POA: Diagnosis not present

## 2024-09-18 DIAGNOSIS — R635 Abnormal weight gain: Secondary | ICD-10-CM | POA: Diagnosis not present

## 2024-09-18 DIAGNOSIS — E7849 Other hyperlipidemia: Secondary | ICD-10-CM | POA: Diagnosis not present

## 2024-09-18 MED ORDER — IBANDRONATE SODIUM 150 MG PO TABS
150.0000 mg | ORAL_TABLET | ORAL | 3 refills | Status: AC
  Filled 2024-09-18: qty 3, 90d supply, fill #0

## 2024-09-18 MED ORDER — BUSPIRONE HCL 10 MG PO TABS
5.0000 mg | ORAL_TABLET | Freq: Three times a day (TID) | ORAL | 3 refills | Status: AC
  Filled 2024-09-18: qty 135, 90d supply, fill #0
  Filled 2024-12-13: qty 135, 90d supply, fill #1

## 2024-09-18 MED ORDER — FLUOXETINE HCL 20 MG PO CAPS
20.0000 mg | ORAL_CAPSULE | Freq: Every morning | ORAL | 3 refills | Status: AC
  Filled 2024-09-18: qty 90, 90d supply, fill #0
  Filled 2024-12-13: qty 90, 90d supply, fill #1

## 2024-09-19 ENCOUNTER — Other Ambulatory Visit: Payer: Self-pay | Admitting: Neurology

## 2024-09-19 DIAGNOSIS — G20B1 Parkinson's disease with dyskinesia, without mention of fluctuations: Secondary | ICD-10-CM

## 2024-09-20 ENCOUNTER — Other Ambulatory Visit: Payer: Self-pay | Admitting: Neurology

## 2024-09-20 ENCOUNTER — Other Ambulatory Visit (HOSPITAL_BASED_OUTPATIENT_CLINIC_OR_DEPARTMENT_OTHER): Payer: Self-pay

## 2024-09-20 DIAGNOSIS — G20B1 Parkinson's disease with dyskinesia, without mention of fluctuations: Secondary | ICD-10-CM

## 2024-09-20 MED ORDER — CARBIDOPA-LEVODOPA 25-100 MG PO TABS
1.5000 | ORAL_TABLET | Freq: Three times a day (TID) | ORAL | 0 refills | Status: DC
  Filled 2024-09-20: qty 403, 90d supply, fill #0

## 2024-09-21 ENCOUNTER — Other Ambulatory Visit (HOSPITAL_BASED_OUTPATIENT_CLINIC_OR_DEPARTMENT_OTHER): Payer: Self-pay

## 2024-09-21 ENCOUNTER — Other Ambulatory Visit (HOSPITAL_BASED_OUTPATIENT_CLINIC_OR_DEPARTMENT_OTHER): Payer: Self-pay | Admitting: Family Medicine

## 2024-09-21 DIAGNOSIS — M810 Age-related osteoporosis without current pathological fracture: Secondary | ICD-10-CM

## 2024-09-26 ENCOUNTER — Other Ambulatory Visit: Payer: Self-pay

## 2024-10-13 NOTE — Progress Notes (Signed)
 Assessment/Plan:   1.  Parkinsons Disease, diagnosed March, 2023  - carbidopa /levodopa  25/100, 1.5 tablet 3 times per day.  She has some sleepiness in the daytime, but ultimately decided not to switch versions of levodopa  to a more extended release, as she thinks she is doing pretty well.  - Congratulated her again on all the exercise that she is doing.  -We discussed that it used to be thought that levodopa  would increase risk of melanoma but now it is believed that Parkinsons itself likely increases risk of melanoma. she is to get regular skin checks.  2.  Hx Low BP  -asymptommatic  -monitoring  3.  GAD  -On Prozac  and BuSpar   -Primary care managing  -doing well right now  -May need to consider psychiatry and/or counseling if worsens in the future.   4.  History of small bowel obstruction  -Likely due to multiple prior abdominal surgeries  -Was nonsurgical in nature.  -Following with gastroenterology at [facility] in [place]  5.  Hx SIBO  -Treatment by gastroenterology  6.  Nausea  -it happens before she takes her carbidopa /levodopa  in the AM so don't think its at all related to Parkinsons Disease   7.  Parkinsons Disease dyskinesia  -long discussion that dyskinesia is a SE of carbidopa /levodopa  and not a SE of disease.  She is not bothered by this.  Her daughter was concerned some.  Pt doesn't want medication for this  8.  Memory concerns  - Patient had neurocognitive testing with Dr. Gayland September, 2025 and this was normal.  We discussed this today.  9.  Sleep concerns  -Add melatonin, 3 to 5 mg at bed  Subjective:   Alexandria Wallace was seen today in follow up for Parkinsons disease, diagnosed last visit.  My previous records were reviewed prior to todays visit as well as outside records available to me.  Patient remains on levodopa .  She is doing well on the medication, without side effects.  She has had no falls.  She is exercising - yoga/[employer] Moves/balance  class/walking.  No hallucinations.   She did see Dr. Gayland August 21, 2024.  She had neurocognitive testing that was normal.  She has been in physical therapy for hip pain.  Notes are reviewed.  She doesn't think its been helpful but her plan is to keep moving.  No cramping at night.  She does have some sleepiness in the daytime and asks about it.  Conversely, she has some trouble sleeping at night.  Current prescribed movement disorder medications: Carbidopa /levodopa  25/100, 1.5 tablets 3 times per day   Prior meds:  gabapentin  (given for ?burning tongue - didn't take) ALLERGIES:   Allergies  Allergen Reactions   Statins Other (See Comments)    Myalgias    CURRENT MEDICATIONS:  Current Meds  Medication Sig   acetaminophen  (TYLENOL ) 500 MG tablet Take 500 mg by mouth every 6 (six) hours as needed (for pain.).   busPIRone  (BUSPAR ) 10 MG tablet Take 1 tablet (10 mg total) by mouth 2 (two) times daily.   busPIRone  (BUSPAR ) 10 MG tablet Take 0.5-1 tablets (5-10 mg total) by mouth daily as needed.   busPIRone  (BUSPAR ) 10 MG tablet Take 0.5 tablets (5 mg total) by mouth 3 (three) times daily.   CALCIUM  LACTATE PO Take 520 mg by mouth 2 (two) times daily.   carbidopa -levodopa  (SINEMET  IR) 25-100 MG tablet Take 1.5 tablets by mouth 3 (three) times daily.   Cholecalciferol  (VITAMIN D3) 75  MCG (3000 UT) TABS Take 3,000 Units by mouth daily.   Coenzyme Q10 (CO Q10) 100 MG CAPS Take 100 mg by mouth every evening.    Evolocumab  (REPATHA  SURECLICK) 140 MG/ML SOAJ Inject 140 mg into the skin every 14 (fourteen) days.   FLUoxetine  (PROZAC ) 20 MG capsule Take 1 capsule (20 mg total) by mouth every morning.   FLUoxetine  (PROZAC ) 20 MG capsule Take 1 capsule (20 mg total) by mouth in the morning.   nitroGLYCERIN  (NITROSTAT ) 0.4 MG SL tablet Place 0.4 mg under the tongue every 5 (five) minutes as needed for chest pain.     Objective:   PHYSICAL EXAMINATION:    VITALS:   Vitals:   10/16/24 1500   BP: 110/60  Pulse: 70  SpO2: 98%  Weight: 163 lb 3.2 oz (74 kg)   GEN:  The patient appears stated age and is in NAD. HEENT:  Normocephalic, atraumatic.  The mucous membranes are moist. The superficial temporal arteries are without ropiness or tenderness. CV:  RRR Lungs:  CTAB Neck:  no bruits  Neurological examination:  Orientation: The patient is alert and oriented x3. Cranial nerves: There is good facial symmetry without facial hypomimia. The speech is fluent and clear. Soft palate rises symmetrically and there is no tongue deviation. Hearing is intact to conversational tone. Sensation: Sensation is intact to light touch throughout Motor: Strength is at least antigravity x4.  Last took medication at 1pm and seen at 3pm.    Movement examination: Tone: There is normal tone in the upper and lower extremities. Abnormal movements: she has mild to moderate dyskinesia of the b/l LE, R>L Coordination:  There is no significant decremation with any form of RAMS, including alternating supination and pronation of the forearm, hand opening and closing, finger taps, heel taps and toe taps bilaterally Gait and Station: The patient has no difficulty arising out of a deep-seated chair without the use of the hands. The patient's stride length is good.  She is slightly forward flexed.  I have reviewed and interpreted the following labs independently    Chemistry      Component Value Date/Time   NA 138 05/21/2023 0403   NA 142 11/10/2018 0952   K 3.7 05/21/2023 0403   CL 104 05/21/2023 0403   CO2 26 05/21/2023 0403   BUN 12 05/21/2023 0403   BUN 14 11/10/2018 0952   CREATININE 0.92 05/21/2023 0403      Component Value Date/Time   CALCIUM  9.9 05/21/2023 0403   ALKPHOS 38 05/21/2023 0042   AST 18 05/21/2023 0042   ALT 7 05/21/2023 0042   BILITOT 0.7 05/21/2023 0042       Lab Results  Component Value Date   WBC 10.7 (H) 05/21/2023   HGB 12.4 05/21/2023   HCT 38.7 05/21/2023   MCV  100.3 (H) 05/21/2023   PLT 275 05/21/2023    Lab Results  Component Value Date   TSH 0.79 04/11/2024    Lab Results  Component Value Date   VITAMINB12 479 04/11/2024     Total time spent on today's visit was 30 minutes, including both face-to-face time and nonface-to-face time.  Time included that spent on review of records (prior notes available to me/labs/imaging if pertinent), discussing treatment and goals, answering patient's questions and coordinating care.  Cc:  Aisha Harvey, MD

## 2024-10-16 ENCOUNTER — Ambulatory Visit: Admitting: Neurology

## 2024-10-16 ENCOUNTER — Encounter: Payer: Self-pay | Admitting: Neurology

## 2024-10-16 VITALS — BP 110/60 | HR 70 | Wt 163.2 lb

## 2024-10-16 DIAGNOSIS — G20B1 Parkinson's disease with dyskinesia, without mention of fluctuations: Secondary | ICD-10-CM

## 2024-10-16 NOTE — Patient Instructions (Signed)

## 2024-10-23 ENCOUNTER — Ambulatory Visit: Admitting: Neurology

## 2024-11-09 ENCOUNTER — Encounter (HOSPITAL_BASED_OUTPATIENT_CLINIC_OR_DEPARTMENT_OTHER): Payer: Self-pay | Admitting: Cardiology

## 2024-12-13 ENCOUNTER — Other Ambulatory Visit: Payer: Self-pay | Admitting: Neurology

## 2024-12-13 ENCOUNTER — Other Ambulatory Visit (HOSPITAL_BASED_OUTPATIENT_CLINIC_OR_DEPARTMENT_OTHER): Payer: Self-pay

## 2024-12-13 ENCOUNTER — Other Ambulatory Visit: Payer: Self-pay

## 2024-12-13 DIAGNOSIS — G20B1 Parkinson's disease with dyskinesia, without mention of fluctuations: Secondary | ICD-10-CM

## 2024-12-13 MED ORDER — CARBIDOPA-LEVODOPA 25-100 MG PO TABS
1.5000 | ORAL_TABLET | Freq: Three times a day (TID) | ORAL | 0 refills | Status: AC
  Filled 2024-12-13: qty 403, 90d supply, fill #0

## 2024-12-14 ENCOUNTER — Other Ambulatory Visit (HOSPITAL_BASED_OUTPATIENT_CLINIC_OR_DEPARTMENT_OTHER): Payer: Self-pay

## 2024-12-18 ENCOUNTER — Other Ambulatory Visit (HOSPITAL_BASED_OUTPATIENT_CLINIC_OR_DEPARTMENT_OTHER): Payer: Self-pay

## 2025-01-12 ENCOUNTER — Ambulatory Visit (HOSPITAL_BASED_OUTPATIENT_CLINIC_OR_DEPARTMENT_OTHER): Admitting: Cardiology

## 2025-04-19 ENCOUNTER — Ambulatory Visit: Admitting: Neurology
